# Patient Record
Sex: Female | Born: 1955 | Race: White | Hispanic: No | Marital: Married | Smoking: Never smoker
Health system: Southern US, Community
[De-identification: ages and names within clinical notes are randomized; demographics above are authoritative.]

## PROBLEM LIST (undated history)

## (undated) DIAGNOSIS — E785 Hyperlipidemia, unspecified: Secondary | ICD-10-CM

## (undated) DIAGNOSIS — I10 Essential (primary) hypertension: Secondary | ICD-10-CM

## (undated) DIAGNOSIS — G709 Myoneural disorder, unspecified: Secondary | ICD-10-CM

## (undated) DIAGNOSIS — R600 Localized edema: Secondary | ICD-10-CM

## (undated) DIAGNOSIS — M069 Rheumatoid arthritis, unspecified: Secondary | ICD-10-CM

## (undated) DIAGNOSIS — I059 Rheumatic mitral valve disease, unspecified: Secondary | ICD-10-CM

## (undated) DIAGNOSIS — IMO0001 Reserved for inherently not codable concepts without codable children: Secondary | ICD-10-CM

## (undated) DIAGNOSIS — R569 Unspecified convulsions: Secondary | ICD-10-CM

## (undated) DIAGNOSIS — I499 Cardiac arrhythmia, unspecified: Secondary | ICD-10-CM

## (undated) HISTORY — PX: CARDIAC ELECTROPHYSIOLOGY MAPPING AND ABLATION: SHX1292

## (undated) HISTORY — PX: CERVICAL POLYPECTOMY: SHX88

---

## 1999-10-31 HISTORY — PX: CARDIAC VALVE SURGERY: SHX40

## 2001-03-27 ENCOUNTER — Other Ambulatory Visit: Admission: RE | Admit: 2001-03-27 | Discharge: 2001-03-27 | Payer: Self-pay | Admitting: Family Medicine

## 2005-08-11 ENCOUNTER — Ambulatory Visit: Payer: Self-pay | Admitting: Unknown Physician Specialty

## 2008-02-20 LAB — HM MAMMOGRAPHY: HM Mammogram: NEGATIVE

## 2008-07-02 LAB — HM PAP SMEAR: HM Pap smear: NORMAL

## 2010-10-30 HISTORY — PX: ABDOMINAL HYSTERECTOMY: SHX81

## 2010-10-30 HISTORY — PX: KNEE ARTHROSCOPY: SUR90

## 2011-03-14 ENCOUNTER — Ambulatory Visit: Payer: Self-pay

## 2011-03-23 ENCOUNTER — Ambulatory Visit: Payer: Self-pay | Admitting: Unknown Physician Specialty

## 2011-03-30 ENCOUNTER — Ambulatory Visit: Payer: Self-pay | Admitting: Unknown Physician Specialty

## 2011-04-30 ENCOUNTER — Ambulatory Visit: Payer: Self-pay | Admitting: Gynecologic Oncology

## 2011-05-15 ENCOUNTER — Ambulatory Visit: Payer: Self-pay | Admitting: Unknown Physician Specialty

## 2011-05-15 LAB — HM COLONOSCOPY

## 2011-05-30 ENCOUNTER — Ambulatory Visit: Payer: Self-pay | Admitting: Gynecologic Oncology

## 2011-05-31 ENCOUNTER — Ambulatory Visit: Payer: Self-pay | Admitting: Gynecologic Oncology

## 2011-07-05 ENCOUNTER — Ambulatory Visit: Payer: Self-pay | Admitting: Obstetrics & Gynecology

## 2011-07-11 ENCOUNTER — Ambulatory Visit: Payer: Self-pay | Admitting: Obstetrics & Gynecology

## 2011-07-14 LAB — PATHOLOGY REPORT

## 2013-07-03 DIAGNOSIS — I059 Rheumatic mitral valve disease, unspecified: Secondary | ICD-10-CM | POA: Insufficient documentation

## 2013-07-04 ENCOUNTER — Ambulatory Visit: Payer: Self-pay | Admitting: General Practice

## 2014-05-26 ENCOUNTER — Other Ambulatory Visit: Payer: Self-pay | Admitting: Specialist

## 2014-05-26 DIAGNOSIS — M545 Low back pain, unspecified: Secondary | ICD-10-CM

## 2014-05-27 ENCOUNTER — Ambulatory Visit
Admission: RE | Admit: 2014-05-27 | Discharge: 2014-05-27 | Disposition: A | Discharge status: Home / Self Care | Payer: No Typology Code available for payment source | Source: Ambulatory Visit | Attending: Specialist | Admitting: Specialist

## 2014-05-27 DIAGNOSIS — M545 Low back pain, unspecified: Secondary | ICD-10-CM

## 2014-07-28 DIAGNOSIS — R0602 Shortness of breath: Secondary | ICD-10-CM | POA: Insufficient documentation

## 2014-07-28 DIAGNOSIS — I4891 Unspecified atrial fibrillation: Secondary | ICD-10-CM | POA: Insufficient documentation

## 2014-07-28 DIAGNOSIS — I480 Paroxysmal atrial fibrillation: Secondary | ICD-10-CM | POA: Insufficient documentation

## 2014-08-18 ENCOUNTER — Other Ambulatory Visit (HOSPITAL_COMMUNITY): Payer: No Typology Code available for payment source

## 2014-08-25 ENCOUNTER — Encounter (HOSPITAL_COMMUNITY): Admission: RE | Payer: Self-pay | Source: Ambulatory Visit

## 2014-08-25 ENCOUNTER — Inpatient Hospital Stay (HOSPITAL_COMMUNITY)
Admission: RE | Admit: 2014-08-25 | Payer: No Typology Code available for payment source | Source: Ambulatory Visit | Admitting: Orthopaedic Surgery

## 2014-08-25 SURGERY — ARTHROPLASTY, HIP, TOTAL, ANTERIOR APPROACH
Anesthesia: Choice | Laterality: Left

## 2014-09-25 ENCOUNTER — Ambulatory Visit: Payer: Self-pay | Admitting: Family Medicine

## 2014-09-25 LAB — PROTIME-INR
INR: 2
PROTHROMBIN TIME: 21.8 s — AB (ref 11.5–14.7)

## 2014-09-28 LAB — HEMOGLOBIN A1C: Hgb A1c MFr Bld: 5.7 % (ref 4.0–6.0)

## 2014-09-28 LAB — TSH: TSH: 3.38 u[IU]/mL (ref <=5.90)

## 2014-10-14 LAB — HEPATIC FUNCTION PANEL
ALT: 36 U/L — AB (ref 7–35)
AST: 23 U/L (ref 13–35)

## 2014-10-14 LAB — BASIC METABOLIC PANEL
BUN: 20 mg/dL (ref 4–21)
CREATININE: 0.8 mg/dL (ref <=1.1)
Glucose: 87 mg/dL
SODIUM: 141 mmol/L (ref 137–147)

## 2014-10-14 LAB — CBC AND DIFFERENTIAL: WBC: 5.8 10^3/mL

## 2014-10-27 ENCOUNTER — Other Ambulatory Visit (HOSPITAL_COMMUNITY): Payer: Self-pay | Admitting: Orthopaedic Surgery

## 2014-10-28 ENCOUNTER — Other Ambulatory Visit (HOSPITAL_COMMUNITY): Payer: Self-pay | Admitting: *Deleted

## 2014-10-28 NOTE — Patient Instructions (Addendum)
Lavonia Eager  10/28/2014   Your procedure is scheduled on: 11/06/13   Report to [facility]  Entrance and follow signs to               Short Stay Center at 7:40 AM.   Call this number if you have problems the morning of surgery [phone]   Remember:  Do not eat food or drink liquids :After Midnight.     Take these medicines the morning of surgery with A SIP OF WATER: METOPROLOL                               You may not have any metal on your body including hair pins and              piercings  Do not wear jewelry, make-up, lotions, powders or perfumes.             Do not wear nail polish.  Do not shave  48 hours prior to surgery.              Men may shave face and neck.   Do not bring valuables to the hospital. [facility] IS NOT             RESPONSIBLE   FOR VALUABLES.  Contacts, dentures or bridgework may not be worn into surgery.  Leave suitcase in the car. After surgery it may be brought to your room.     Patients discharged the day of surgery will not be allowed to drive home.  Name and phone number of your driver:  Special Instructions: N/A              Please read over the following fact sheets you were given: _____________________________________________________________________                                                     [facility] - PREPARING FOR SURGERY  Before surgery, you can play an important role.  Because skin is not sterile, your skin needs to be as free of germs as possible.  You can reduce the number of germs on your skin by washing with CHG (chlorahexidine gluconate) soap before surgery.  CHG is an antiseptic cleaner which kills germs and bonds with the skin to continue killing germs even after washing. Please DO NOT use if you have an allergy to CHG or antibacterial soaps.  If your skin becomes reddened/irritated stop using the CHG and inform your nurse when you arrive at Short Stay. Do not shave (including legs  and underarms) for at least 48 hours prior to the first CHG shower.  You may shave your face. Please follow these instructions carefully:   1.  Shower with CHG Soap the night before surgery and the  morning of Surgery.   2.  If you choose to wash your hair, wash your hair first as usual with your  normal  Shampoo.   3.  After you shampoo, rinse your hair and body thoroughly to remove the  shampoo.  4.  Use CHG as you would any other liquid soap.  You can apply chg directly  to the skin and wash . Gently wash with scrungie or clean wascloth    5.  Apply the CHG Soap to your body ONLY FROM THE NECK DOWN.   Do not use on open                           Wound or open sores. Avoid contact with eyes, ears mouth and genitals (private parts).                        Genitals (private parts) with your normal soap.              6.  Wash thoroughly, paying special attention to the area where your surgery  will be performed.   7.  Thoroughly rinse your body with warm water from the neck down.   8.  DO NOT shower/wash with your normal soap after using and rinsing off  the CHG Soap .                9.  Pat yourself dry with a clean towel.             10.  Wear clean pajamas.             11.  Place clean sheets on your bed the night of your first shower and do not  sleep with pets.  Day of Surgery : Do not apply any lotions/deodorants the morning of surgery.  Please wear clean clothes to the hospital/surgery center.  FAILURE TO FOLLOW THESE INSTRUCTIONS MAY RESULT IN THE CANCELLATION OF YOUR SURGERY    PATIENT SIGNATURE_________________________________  ______________________________________________________________________

## 2014-10-29 ENCOUNTER — Encounter (HOSPITAL_COMMUNITY): Payer: Self-pay

## 2014-10-29 ENCOUNTER — Ambulatory Visit (HOSPITAL_COMMUNITY)
Admission: RE | Admit: 2014-10-29 | Discharge: 2014-10-29 | Disposition: A | Discharge status: Home / Self Care | Payer: No Typology Code available for payment source | Source: Ambulatory Visit | Attending: Anesthesiology | Admitting: Anesthesiology

## 2014-10-29 ENCOUNTER — Encounter (HOSPITAL_COMMUNITY)
Admission: RE | Admit: 2014-10-29 | Discharge: 2014-10-29 | Disposition: A | Discharge status: Home / Self Care | Payer: No Typology Code available for payment source | Source: Ambulatory Visit | Attending: Orthopaedic Surgery | Admitting: Orthopaedic Surgery

## 2014-10-29 DIAGNOSIS — I10 Essential (primary) hypertension: Secondary | ICD-10-CM | POA: Insufficient documentation

## 2014-10-29 DIAGNOSIS — I4891 Unspecified atrial fibrillation: Secondary | ICD-10-CM | POA: Insufficient documentation

## 2014-10-29 DIAGNOSIS — Z01818 Encounter for other preprocedural examination: Secondary | ICD-10-CM | POA: Insufficient documentation

## 2014-10-29 DIAGNOSIS — M5134 Other intervertebral disc degeneration, thoracic region: Secondary | ICD-10-CM | POA: Diagnosis not present

## 2014-10-29 HISTORY — DX: Reserved for inherently not codable concepts without codable children: IMO0001

## 2014-10-29 HISTORY — DX: Essential (primary) hypertension: I10

## 2014-10-29 HISTORY — DX: Cardiac arrhythmia, unspecified: I49.9

## 2014-10-29 HISTORY — DX: Unspecified convulsions: R56.9

## 2014-10-29 HISTORY — DX: Rheumatic mitral valve disease, unspecified: I05.9

## 2014-10-29 HISTORY — DX: Hyperlipidemia, unspecified: E78.5

## 2014-10-29 HISTORY — DX: Localized edema: R60.0

## 2014-10-29 HISTORY — DX: Rheumatoid arthritis, unspecified: M06.9

## 2014-10-29 HISTORY — DX: Myoneural disorder, unspecified: G70.9

## 2014-10-29 LAB — URINALYSIS, ROUTINE W REFLEX MICROSCOPIC
Bilirubin Urine: NEGATIVE
Glucose, UA: NEGATIVE mg/dL
Hgb urine dipstick: NEGATIVE
Ketones, ur: NEGATIVE mg/dL
Leukocytes, UA: NEGATIVE
Nitrite: NEGATIVE
PH: 5.5 (ref 5.0–8.0)
PROTEIN: NEGATIVE mg/dL
SPECIFIC GRAVITY, URINE: 1.015 (ref 1.005–1.030)
UROBILINOGEN UA: 0.2 mg/dL (ref 0.0–1.0)

## 2014-10-29 LAB — CBC
HEMATOCRIT: 42.3 % (ref 36.0–46.0)
HEMOGLOBIN: 13.5 g/dL (ref 12.0–15.0)
MCH: 29.9 pg (ref 26.0–34.0)
MCHC: 31.9 g/dL (ref 30.0–36.0)
MCV: 93.6 fL (ref 78.0–100.0)
Platelets: 175 10*3/uL (ref 150–400)
RBC: 4.52 MIL/uL (ref 3.87–5.11)
RDW: 14.4 % (ref 11.5–15.5)
WBC: 6 10*3/uL (ref 4.0–10.5)

## 2014-10-29 LAB — BASIC METABOLIC PANEL
Anion gap: 5 (ref 5–15)
BUN: 19 mg/dL (ref 6–23)
CALCIUM: 9.1 mg/dL (ref 8.4–10.5)
CO2: 32 mmol/L (ref 19–32)
CREATININE: 0.83 mg/dL (ref 0.50–1.10)
Chloride: 103 mEq/L (ref 96–112)
GFR calc Af Amer: 88 mL/min — ABNORMAL LOW (ref >90)
GFR, EST NON AFRICAN AMERICAN: 76 mL/min — AB (ref >90)
Glucose, Bld: 81 mg/dL (ref 70–99)
POTASSIUM: 4.4 mmol/L (ref 3.5–5.1)
Sodium: 140 mmol/L (ref 135–145)

## 2014-10-29 LAB — SURGICAL PCR SCREEN
MRSA, PCR: NEGATIVE
Staphylococcus aureus: NEGATIVE

## 2014-10-29 LAB — PROTIME-INR
INR: 2 — AB (ref 0.00–1.49)
Prothrombin Time: 22.9 seconds — ABNORMAL HIGH (ref 11.6–15.2)

## 2014-10-29 LAB — APTT: aPTT: 39 seconds — ABNORMAL HIGH (ref 24–37)

## 2014-10-29 LAB — ABO/RH: ABO/RH(D): AB POS

## 2014-10-29 NOTE — Progress Notes (Signed)
Dr Leta Jungling made aware patient is a cardiac patient.  Clearance on chart from Dr Regino Schultze( cardiologist )   Patient with history of valve surgery in 2001 , hypertension, and atrial fib .  No new orders given.

## 2014-10-29 NOTE — Progress Notes (Signed)
PT and PTT result from 10/29/2014 faxed via EPIC to Dr Allie Bossier via EPIC.  PT/INR will be drawn am of surgery.

## 2014-10-29 NOTE — Progress Notes (Signed)
   10/29/14 0825  OBSTRUCTIVE SLEEP APNEA  Have you ever been diagnosed with sleep apnea through a sleep study? No  Do you snore loudly (loud enough to be heard through closed doors)?  1  Do you often feel tired, fatigued, or sleepy during the daytime? 1  Has anyone observed you stop breathing during your sleep? 0  Do you have, or are you being treated for high blood pressure? 1  BMI more than 35 kg/m2? 1  Age over 59 years old? 1  Neck circumference greater than 40 cm/16 inches? 1  Gender: 0  Obstructive Sleep Apnea Score 6  Score 4 or greater  Results sent to PCP

## 2014-11-06 ENCOUNTER — Encounter (HOSPITAL_COMMUNITY): Payer: Self-pay | Admitting: *Deleted

## 2014-11-06 ENCOUNTER — Inpatient Hospital Stay (HOSPITAL_COMMUNITY): Payer: No Typology Code available for payment source | Admitting: Registered Nurse

## 2014-11-06 ENCOUNTER — Inpatient Hospital Stay (HOSPITAL_COMMUNITY): Payer: No Typology Code available for payment source

## 2014-11-06 ENCOUNTER — Inpatient Hospital Stay (HOSPITAL_COMMUNITY)
Admission: RE | Admit: 2014-11-06 | Discharge: 2014-11-09 | DRG: 470 | Disposition: A | Discharge status: Home / Self Care | Payer: No Typology Code available for payment source | Source: Ambulatory Visit | Attending: Orthopaedic Surgery | Admitting: Orthopaedic Surgery

## 2014-11-06 ENCOUNTER — Encounter (HOSPITAL_COMMUNITY)
Admission: RE | Disposition: A | Discharge status: Home / Self Care | Payer: Self-pay | Source: Ambulatory Visit | Attending: Orthopaedic Surgery

## 2014-11-06 DIAGNOSIS — M1612 Unilateral primary osteoarthritis, left hip: Principal | ICD-10-CM | POA: Diagnosis present

## 2014-11-06 DIAGNOSIS — Z90710 Acquired absence of both cervix and uterus: Secondary | ICD-10-CM | POA: Diagnosis not present

## 2014-11-06 DIAGNOSIS — I050 Rheumatic mitral stenosis: Secondary | ICD-10-CM | POA: Diagnosis present

## 2014-11-06 DIAGNOSIS — I10 Essential (primary) hypertension: Secondary | ICD-10-CM | POA: Diagnosis present

## 2014-11-06 DIAGNOSIS — M25552 Pain in left hip: Secondary | ICD-10-CM | POA: Diagnosis present

## 2014-11-06 DIAGNOSIS — F1010 Alcohol abuse, uncomplicated: Secondary | ICD-10-CM | POA: Diagnosis present

## 2014-11-06 DIAGNOSIS — R569 Unspecified convulsions: Secondary | ICD-10-CM | POA: Diagnosis present

## 2014-11-06 DIAGNOSIS — E785 Hyperlipidemia, unspecified: Secondary | ICD-10-CM | POA: Diagnosis present

## 2014-11-06 DIAGNOSIS — M069 Rheumatoid arthritis, unspecified: Secondary | ICD-10-CM | POA: Diagnosis present

## 2014-11-06 DIAGNOSIS — Z7982 Long term (current) use of aspirin: Secondary | ICD-10-CM

## 2014-11-06 DIAGNOSIS — Z419 Encounter for procedure for purposes other than remedying health state, unspecified: Secondary | ICD-10-CM

## 2014-11-06 DIAGNOSIS — D62 Acute posthemorrhagic anemia: Secondary | ICD-10-CM | POA: Diagnosis not present

## 2014-11-06 DIAGNOSIS — I4891 Unspecified atrial fibrillation: Secondary | ICD-10-CM | POA: Diagnosis present

## 2014-11-06 DIAGNOSIS — M1611 Unilateral primary osteoarthritis, right hip: Secondary | ICD-10-CM

## 2014-11-06 DIAGNOSIS — Z96642 Presence of left artificial hip joint: Secondary | ICD-10-CM

## 2014-11-06 HISTORY — PX: TOTAL HIP ARTHROPLASTY: SHX124

## 2014-11-06 LAB — TYPE AND SCREEN
ABO/RH(D): AB POS
Antibody Screen: NEGATIVE

## 2014-11-06 LAB — PROTIME-INR
INR: 1.05 (ref 0.00–1.49)
Prothrombin Time: 13.8 seconds (ref 11.6–15.2)

## 2014-11-06 SURGERY — ARTHROPLASTY, HIP, TOTAL, ANTERIOR APPROACH
Anesthesia: General | Site: Hip | Laterality: Left

## 2014-11-06 MED ORDER — CEFAZOLIN SODIUM-DEXTROSE 2-3 GM-% IV SOLR
INTRAVENOUS | Status: AC
  Filled 2014-11-06: qty 50

## 2014-11-06 MED ORDER — ONDANSETRON HCL 4 MG/2ML IJ SOLN
INTRAMUSCULAR | Status: DC | PRN
  Administered 2014-11-06: 4 mg via INTRAVENOUS

## 2014-11-06 MED ORDER — FENTANYL CITRATE 0.05 MG/ML IJ SOLN
INTRAMUSCULAR | Status: AC
  Filled 2014-11-06: qty 2

## 2014-11-06 MED ORDER — HYDRALAZINE HCL 20 MG/ML IJ SOLN
2.0000 mg | Freq: Once | INTRAMUSCULAR | Status: AC
  Administered 2014-11-06: 2 mg via INTRAVENOUS

## 2014-11-06 MED ORDER — GLYCOPYRROLATE 0.2 MG/ML IJ SOLN
INTRAMUSCULAR | Status: DC | PRN
  Administered 2014-11-06: 0.2 mg via INTRAVENOUS
  Administered 2014-11-06: .8 mg via INTRAVENOUS

## 2014-11-06 MED ORDER — ASPIRIN EC 81 MG PO TBEC
81.0000 mg | DELAYED_RELEASE_TABLET | Freq: Every day | ORAL | Status: DC
  Administered 2014-11-07 – 2014-11-09 (×3): 81 mg via ORAL
  Filled 2014-11-06 (×3): qty 1

## 2014-11-06 MED ORDER — METOPROLOL TARTRATE 50 MG PO TABS
50.0000 mg | ORAL_TABLET | Freq: Every morning | ORAL | Status: DC
  Administered 2014-11-07 – 2014-11-09 (×3): 50 mg via ORAL
  Filled 2014-11-06 (×3): qty 1

## 2014-11-06 MED ORDER — LIDOCAINE HCL (CARDIAC) 20 MG/ML IV SOLN
INTRAVENOUS | Status: DC | PRN
  Administered 2014-11-06: 100 mg via INTRAVENOUS

## 2014-11-06 MED ORDER — HYDROMORPHONE HCL 1 MG/ML IJ SOLN: 1.0000 mg | INTRAMUSCULAR | Status: DC | PRN

## 2014-11-06 MED ORDER — DOCUSATE SODIUM 100 MG PO CAPS
100.0000 mg | ORAL_CAPSULE | Freq: Two times a day (BID) | ORAL | Status: DC
  Administered 2014-11-06 – 2014-11-09 (×6): 100 mg via ORAL
  Filled 2014-11-06 (×3): qty 1

## 2014-11-06 MED ORDER — HYDROMORPHONE HCL 2 MG/ML IJ SOLN
INTRAMUSCULAR | Status: AC
  Filled 2014-11-06: qty 1

## 2014-11-06 MED ORDER — 0.9 % SODIUM CHLORIDE (POUR BTL) OPTIME
TOPICAL | Status: DC | PRN
  Administered 2014-11-06: 1000 mL

## 2014-11-06 MED ORDER — WARFARIN SODIUM 7.5 MG PO TABS
7.5000 mg | ORAL_TABLET | Freq: Once | ORAL | Status: AC
  Administered 2014-11-06: 7.5 mg via ORAL
  Filled 2014-11-06: qty 1

## 2014-11-06 MED ORDER — CEFAZOLIN SODIUM-DEXTROSE 2-3 GM-% IV SOLR
2.0000 g | Freq: Four times a day (QID) | INTRAVENOUS | Status: AC
  Administered 2014-11-06 (×2): 2 g via INTRAVENOUS
  Filled 2014-11-06 (×3): qty 50

## 2014-11-06 MED ORDER — PSYLLIUM 95 % PO PACK
1.0000 | PACK | Freq: Two times a day (BID) | ORAL | Status: DC
  Administered 2014-11-06 – 2014-11-09 (×6): 1 via ORAL
  Filled 2014-11-06 (×7): qty 1

## 2014-11-06 MED ORDER — ONDANSETRON HCL 4 MG PO TABS
4.0000 mg | ORAL_TABLET | Freq: Four times a day (QID) | ORAL | Status: DC | PRN
  Filled 2014-11-06: qty 1

## 2014-11-06 MED ORDER — MENTHOL 3 MG MT LOZG
1.0000 | LOZENGE | OROMUCOSAL | Status: DC | PRN
  Filled 2014-11-06: qty 9

## 2014-11-06 MED ORDER — MEPERIDINE HCL 50 MG/ML IJ SOLN: 6.2500 mg | INTRAMUSCULAR | Status: DC | PRN

## 2014-11-06 MED ORDER — ONDANSETRON HCL 4 MG/2ML IJ SOLN: 4.0000 mg | Freq: Four times a day (QID) | INTRAMUSCULAR | Status: DC | PRN

## 2014-11-06 MED ORDER — POTASSIUM CHLORIDE CRYS ER 10 MEQ PO TBCR
10.0000 meq | EXTENDED_RELEASE_TABLET | Freq: Every day | ORAL | Status: DC
  Administered 2014-11-06: 10 meq via ORAL
  Filled 2014-11-06 (×2): qty 1

## 2014-11-06 MED ORDER — NEOSTIGMINE METHYLSULFATE 10 MG/10ML IV SOLN
INTRAVENOUS | Status: DC | PRN
  Administered 2014-11-06: 5 mg via INTRAVENOUS

## 2014-11-06 MED ORDER — LACTATED RINGERS IV SOLN
INTRAVENOUS | Status: DC
  Administered 2014-11-06 (×2): 1000 mL via INTRAVENOUS

## 2014-11-06 MED ORDER — POLYETHYLENE GLYCOL 3350 17 G PO PACK
17.0000 g | PACK | Freq: Every day | ORAL | Status: DC | PRN
  Filled 2014-11-06: qty 1

## 2014-11-06 MED ORDER — MIDAZOLAM HCL 2 MG/2ML IJ SOLN
INTRAMUSCULAR | Status: AC
  Filled 2014-11-06: qty 2

## 2014-11-06 MED ORDER — METOCLOPRAMIDE HCL 5 MG PO TABS
5.0000 mg | ORAL_TABLET | Freq: Three times a day (TID) | ORAL | Status: DC | PRN
  Filled 2014-11-06: qty 2

## 2014-11-06 MED ORDER — CEFAZOLIN SODIUM-DEXTROSE 2-3 GM-% IV SOLR
2.0000 g | INTRAVENOUS | Status: AC
  Administered 2014-11-06: 2 g via INTRAVENOUS

## 2014-11-06 MED ORDER — WARFARIN - PHARMACIST DOSING INPATIENT: Freq: Every day | Status: DC

## 2014-11-06 MED ORDER — ACETAMINOPHEN 325 MG PO TABS
650.0000 mg | ORAL_TABLET | Freq: Four times a day (QID) | ORAL | Status: DC | PRN
  Administered 2014-11-06 – 2014-11-08 (×5): 650 mg via ORAL
  Filled 2014-11-06 (×5): qty 2

## 2014-11-06 MED ORDER — PROMETHAZINE HCL 25 MG/ML IJ SOLN: 6.2500 mg | INTRAMUSCULAR | Status: DC | PRN

## 2014-11-06 MED ORDER — PHENOL 1.4 % MT LIQD
1.0000 | OROMUCOSAL | Status: DC | PRN
  Filled 2014-11-06: qty 177

## 2014-11-06 MED ORDER — ROCURONIUM BROMIDE 100 MG/10ML IV SOLN
INTRAVENOUS | Status: DC | PRN
  Administered 2014-11-06: 30 mg via INTRAVENOUS

## 2014-11-06 MED ORDER — ZOLPIDEM TARTRATE 5 MG PO TABS: 5.0000 mg | ORAL_TABLET | Freq: Every evening | ORAL | Status: DC | PRN

## 2014-11-06 MED ORDER — SODIUM CHLORIDE 0.9 % IR SOLN
Status: DC | PRN
  Administered 2014-11-06: 1000 mL

## 2014-11-06 MED ORDER — PHENYTOIN SODIUM EXTENDED 100 MG PO CAPS
400.0000 mg | ORAL_CAPSULE | Freq: Every day | ORAL | Status: DC
  Administered 2014-11-06 – 2014-11-08 (×3): 400 mg via ORAL
  Filled 2014-11-06 (×5): qty 4

## 2014-11-06 MED ORDER — LOSARTAN POTASSIUM 50 MG PO TABS
100.0000 mg | ORAL_TABLET | Freq: Every morning | ORAL | Status: DC
  Administered 2014-11-08: 100 mg via ORAL
  Filled 2014-11-06 (×4): qty 2

## 2014-11-06 MED ORDER — DEXAMETHASONE SODIUM PHOSPHATE 10 MG/ML IJ SOLN
INTRAMUSCULAR | Status: DC | PRN
  Administered 2014-11-06: 10 mg via INTRAVENOUS

## 2014-11-06 MED ORDER — METHOCARBAMOL 1000 MG/10ML IJ SOLN
500.0000 mg | Freq: Four times a day (QID) | INTRAVENOUS | Status: DC | PRN
  Administered 2014-11-06: 500 mg via INTRAVENOUS
  Filled 2014-11-06 (×2): qty 5

## 2014-11-06 MED ORDER — ONDANSETRON HCL 4 MG/2ML IJ SOLN
INTRAMUSCULAR | Status: AC
  Filled 2014-11-06: qty 2

## 2014-11-06 MED ORDER — GLYCOPYRROLATE 0.2 MG/ML IJ SOLN
INTRAMUSCULAR | Status: AC
  Filled 2014-11-06: qty 4

## 2014-11-06 MED ORDER — ADULT MULTIVITAMIN W/MINERALS CH
1.0000 | ORAL_TABLET | Freq: Every day | ORAL | Status: DC
  Administered 2014-11-06 – 2014-11-08 (×3): 1 via ORAL
  Filled 2014-11-06 (×4): qty 1

## 2014-11-06 MED ORDER — SUCCINYLCHOLINE CHLORIDE 20 MG/ML IJ SOLN
INTRAMUSCULAR | Status: DC | PRN
  Administered 2014-11-06: 100 mg via INTRAVENOUS

## 2014-11-06 MED ORDER — PROPOFOL 10 MG/ML IV BOLUS
INTRAVENOUS | Status: DC | PRN
  Administered 2014-11-06: 20 mg via INTRAVENOUS
  Administered 2014-11-06: 200 mg via INTRAVENOUS

## 2014-11-06 MED ORDER — ROCURONIUM BROMIDE 100 MG/10ML IV SOLN
INTRAVENOUS | Status: AC
Start: 2014-11-06 — End: 2014-11-06
  Filled 2014-11-06: qty 1

## 2014-11-06 MED ORDER — HYDROMORPHONE HCL 1 MG/ML IJ SOLN
INTRAMUSCULAR | Status: DC | PRN
  Administered 2014-11-06 (×4): 0.5 mg via INTRAVENOUS

## 2014-11-06 MED ORDER — MIDAZOLAM HCL 5 MG/5ML IJ SOLN
INTRAMUSCULAR | Status: DC | PRN
  Administered 2014-11-06 (×2): 1 mg via INTRAVENOUS

## 2014-11-06 MED ORDER — HYDROMORPHONE HCL 1 MG/ML IJ SOLN
0.2500 mg | INTRAMUSCULAR | Status: DC | PRN
  Administered 2014-11-06 (×2): 0.5 mg via INTRAVENOUS

## 2014-11-06 MED ORDER — LIDOCAINE HCL (CARDIAC) 20 MG/ML IV SOLN
INTRAVENOUS | Status: AC
  Filled 2014-11-06: qty 5

## 2014-11-06 MED ORDER — LACTATED RINGERS IV SOLN: INTRAVENOUS | Status: DC

## 2014-11-06 MED ORDER — TORSEMIDE 20 MG PO TABS
20.0000 mg | ORAL_TABLET | Freq: Every day | ORAL | Status: DC
  Administered 2014-11-06 – 2014-11-08 (×3): 20 mg via ORAL
  Filled 2014-11-06 (×6): qty 1

## 2014-11-06 MED ORDER — NEOSTIGMINE METHYLSULFATE 10 MG/10ML IV SOLN
INTRAVENOUS | Status: AC
  Filled 2014-11-06: qty 1

## 2014-11-06 MED ORDER — HYDROMORPHONE HCL 1 MG/ML IJ SOLN
INTRAMUSCULAR | Status: AC
  Filled 2014-11-06: qty 1

## 2014-11-06 MED ORDER — OXYCODONE HCL 5 MG PO TABS
5.0000 mg | ORAL_TABLET | ORAL | Status: DC | PRN
  Administered 2014-11-06 – 2014-11-08 (×10): 10 mg via ORAL
  Administered 2014-11-09: 5 mg via ORAL
  Filled 2014-11-06 (×3): qty 2
  Filled 2014-11-06: qty 1
  Filled 2014-11-06 (×8): qty 2

## 2014-11-06 MED ORDER — SODIUM CHLORIDE 0.9 % IV SOLN
INTRAVENOUS | Status: DC
  Administered 2014-11-06: 75 mL/h via INTRAVENOUS

## 2014-11-06 MED ORDER — DIPHENHYDRAMINE HCL 12.5 MG/5ML PO ELIX
12.5000 mg | ORAL_SOLUTION | ORAL | Status: DC | PRN
  Filled 2014-11-06: qty 10

## 2014-11-06 MED ORDER — ACETAMINOPHEN 650 MG RE SUPP
650.0000 mg | Freq: Four times a day (QID) | RECTAL | Status: DC | PRN
  Filled 2014-11-06: qty 1

## 2014-11-06 MED ORDER — HYDRALAZINE HCL 20 MG/ML IJ SOLN
INTRAMUSCULAR | Status: AC
  Filled 2014-11-06: qty 1

## 2014-11-06 MED ORDER — METHOCARBAMOL 500 MG PO TABS
500.0000 mg | ORAL_TABLET | Freq: Four times a day (QID) | ORAL | Status: DC | PRN
  Administered 2014-11-07 – 2014-11-09 (×6): 500 mg via ORAL
  Filled 2014-11-06 (×6): qty 1

## 2014-11-06 MED ORDER — FENTANYL CITRATE 0.05 MG/ML IJ SOLN
INTRAMUSCULAR | Status: DC | PRN
  Administered 2014-11-06 (×4): 50 ug via INTRAVENOUS

## 2014-11-06 MED ORDER — PROPOFOL 10 MG/ML IV BOLUS
INTRAVENOUS | Status: AC
  Filled 2014-11-06: qty 20

## 2014-11-06 MED ORDER — ALUM & MAG HYDROXIDE-SIMETH 200-200-20 MG/5ML PO SUSP
30.0000 mL | ORAL | Status: DC | PRN
  Filled 2014-11-06: qty 30

## 2014-11-06 MED ORDER — METOCLOPRAMIDE HCL 5 MG/ML IJ SOLN
5.0000 mg | Freq: Three times a day (TID) | INTRAMUSCULAR | Status: DC | PRN
  Administered 2014-11-06: 10 mg via INTRAVENOUS
  Filled 2014-11-06: qty 2

## 2014-11-06 SURGICAL SUPPLY — 48 items
APL SKNCLS STERI-STRIP NONHPOA (GAUZE/BANDAGES/DRESSINGS)
BAG SPEC THK2 15X12 ZIP CLS (MISCELLANEOUS)
BAG ZIPLOCK 12X15 (MISCELLANEOUS) IMPLANT
BENZOIN TINCTURE PRP APPL 2/3 (GAUZE/BANDAGES/DRESSINGS) IMPLANT
BLADE SAW SGTL 18X1.27X75 (BLADE) ×2 IMPLANT
CAPT HIP TOTAL 2 ×1 IMPLANT
CELLS DAT CNTRL 66122 CELL SVR (MISCELLANEOUS) ×1 IMPLANT
COVER PERINEAL POST (MISCELLANEOUS) ×2 IMPLANT
DRAPE C-ARM 42X120 X-RAY (DRAPES) ×2 IMPLANT
DRAPE STERI IOBAN 125X83 (DRAPES) ×2 IMPLANT
DRAPE U-SHAPE 47X51 STRL (DRAPES) ×6 IMPLANT
DRSG AQUACEL AG ADV 3.5X10 (GAUZE/BANDAGES/DRESSINGS) ×2 IMPLANT
DURAPREP 26ML APPLICATOR (WOUND CARE) ×2 IMPLANT
ELECT BLADE TIP CTD 4 INCH (ELECTRODE) ×2 IMPLANT
ELECT REM PT RETURN 9FT ADLT (ELECTROSURGICAL) ×2
ELECTRODE REM PT RTRN 9FT ADLT (ELECTROSURGICAL) ×1 IMPLANT
FACESHIELD WRAPAROUND (MASK) ×8 IMPLANT
FACESHIELD WRAPAROUND OR TEAM (MASK) ×4 IMPLANT
GAUZE XEROFORM 1X8 LF (GAUZE/BANDAGES/DRESSINGS) ×1 IMPLANT
GLOVE BIO SURGEON STRL SZ7.5 (GLOVE) ×3 IMPLANT
GLOVE BIO SURGEON STRL SZ8 (GLOVE) ×1 IMPLANT
GLOVE BIOGEL PI IND STRL 6.5 (GLOVE) IMPLANT
GLOVE BIOGEL PI IND STRL 7.5 (GLOVE) IMPLANT
GLOVE BIOGEL PI IND STRL 8 (GLOVE) ×2 IMPLANT
GLOVE BIOGEL PI IND STRL 8.5 (GLOVE) IMPLANT
GLOVE BIOGEL PI INDICATOR 6.5 (GLOVE) ×1
GLOVE BIOGEL PI INDICATOR 7.5 (GLOVE) ×1
GLOVE BIOGEL PI INDICATOR 8 (GLOVE) ×2
GLOVE BIOGEL PI INDICATOR 8.5 (GLOVE) ×1
GLOVE ECLIPSE 8.0 STRL XLNG CF (GLOVE) ×2 IMPLANT
GOWN STRL REUS W/TWL XL LVL3 (GOWN DISPOSABLE) ×6 IMPLANT
HANDPIECE INTERPULSE COAX TIP (DISPOSABLE) ×2
KIT BASIN OR (CUSTOM PROCEDURE TRAY) ×2 IMPLANT
PACK TOTAL JOINT (CUSTOM PROCEDURE TRAY) ×2 IMPLANT
RETRACTOR WND ALEXIS 18 MED (MISCELLANEOUS) ×1 IMPLANT
RTRCTR WOUND ALEXIS 18CM MED (MISCELLANEOUS) ×2
SET HNDPC FAN SPRY TIP SCT (DISPOSABLE) ×1 IMPLANT
STAPLER VISISTAT 35W (STAPLE) ×1 IMPLANT
STRIP CLOSURE SKIN 1/2X4 (GAUZE/BANDAGES/DRESSINGS) IMPLANT
SUT ETHIBOND NAB CT1 #1 30IN (SUTURE) ×2 IMPLANT
SUT MNCRL AB 4-0 PS2 18 (SUTURE) IMPLANT
SUT VIC AB 0 CT1 36 (SUTURE) ×2 IMPLANT
SUT VIC AB 1 CT1 36 (SUTURE) ×2 IMPLANT
SUT VIC AB 2-0 CT1 27 (SUTURE) ×6
SUT VIC AB 2-0 CT1 TAPERPNT 27 (SUTURE) ×2 IMPLANT
TOWEL OR 17X26 10 PK STRL BLUE (TOWEL DISPOSABLE) ×2 IMPLANT
TOWEL OR NON WOVEN STRL DISP B (DISPOSABLE) ×2 IMPLANT
TRAY FOLEY CATH 14FRSI W/METER (CATHETERS) ×1 IMPLANT

## 2014-11-06 NOTE — Transfer of Care (Signed)
Immediate Anesthesia Transfer of Care Note  Patient: Victoria Davila  Procedure(s) Performed: Procedure(s): LEFT TOTAL HIP ARTHROPLASTY ANTERIOR APPROACH (Left)  Patient Location: PACU  Anesthesia Type:General  Level of Consciousness: awake, alert , oriented and patient cooperative  Airway & Oxygen Therapy: Patient Spontanous Breathing and Patient connected to face mask oxygen  Post-op Assessment: Report given to PACU RN, Post -op Vital signs reviewed and stable and Patient moving all extremities  Post vital signs: Reviewed and stable  Complications: No apparent anesthesia complications

## 2014-11-06 NOTE — H&P (Signed)
TOTAL HIP ADMISSION H&P  Patient is admitted for left total hip arthroplasty.  Subjective:  Chief Complaint: left hip pain  HPI: Victoria Davila, 59 y.o. female, has a history of pain and functional disability in the left hip(s) due to arthritis and patient has failed non-surgical conservative treatments for greater than 12 weeks to include NSAID's and/or analgesics, corticosteriod injections, flexibility and strengthening excercises, weight reduction as appropriate and activity modification.  Onset of symptoms was gradual starting 5 years ago with gradually worsening course since that time.The patient noted no past surgery on the left hip(s).  Patient currently rates pain in the left hip at 9 out of 10 with activity. Patient has night pain, worsening of pain with activity and weight bearing, pain that interfers with activities of daily living and pain with passive range of motion. Patient has evidence of subchondral sclerosis, periarticular osteophytes and joint space narrowing by imaging studies. This condition presents safety issues increasing the risk of falls.  There is no current active infection.  Patient Active Problem List   Diagnosis Date Noted  . Arthritis of right hip 11/06/2014   Past Medical History  Diagnosis Date  . Hypertension   . Mitral valve disorder     "deformed"  . Dysrhythmia     A FIB / FOLLOWED BY DR Nolon Rod AT [facility]  . Shortness of breath dyspnea     WITH EXERTION  . Seizures     LAST SEIZURE JAN 1995  . Neuromuscular disorder   . Rheumatoid arthritis     RECENT DX - HAS NOT YET SEEN RHEUMATOLOGIST  . Edema of both legs     CHRONIC  . Hyperlipidemia     Past Surgical History  Procedure Laterality Date  . Knee arthroscopy  2012    left  . Abdominal hysterectomy  2012  . Cardiac valve surgery  2001    "heart valve stretched" for mitral valve stenosis    Prescriptions prior to admission  Medication Sig Dispense Refill Last Dose  .  acetaminophen (TYLENOL) 500 MG tablet Take 1,000 mg by mouth every 6 (six) hours as needed for mild pain or moderate pain.     Marland Kitchen aspirin 81 MG tablet Take 81 mg by mouth daily.     . Calcium Carbonate-Vitamin D (CALTRATE 600+D PO) Take 1 tablet by mouth 2 (two) times daily.     . diclofenac (VOLTAREN) 50 MG EC tablet Take 50 mg by mouth 3 (three) times daily as needed for mild pain.     Marland Kitchen losartan (COZAAR) 100 MG tablet Take 100 mg by mouth every morning.     . metoprolol (LOPRESSOR) 50 MG tablet Take 50 mg by mouth every morning.    at 8am  . Multiple Vitamin (MULTIVITAMIN WITH MINERALS) TABS tablet Take 1 tablet by mouth at bedtime.     . Omega 3 1000 MG CAPS Take 1 capsule by mouth 2 (two) times daily.     . phenytoin (DILANTIN) 100 MG ER capsule Take 400 mg by mouth at bedtime.     . potassium chloride (K-DUR,KLOR-CON) 10 MEQ tablet Take 10 mEq by mouth at bedtime.     . psyllium (REGULOID) 0.52 G capsule Take 0.52 g by mouth 2 (two) times daily.     Marland Kitchen torsemide (DEMADEX) 20 MG tablet Take 20 mg by mouth at bedtime.     Marland Kitchen warfarin (COUMADIN) 3 MG tablet Take 6 mg by mouth at bedtime.  No Known Allergies  History  Substance Use Topics  . Smoking status: Never Smoker   . Smokeless tobacco: Not on file  . Alcohol Use: Yes     Comment: OCCASIONAL WINE ONLY    No family history on file.   Review of Systems  Musculoskeletal: Positive for back pain and joint pain.  All other systems reviewed and are negative.   Objective:  Physical Exam  Constitutional: She is oriented to person, place, and time. She appears well-developed and well-nourished.  HENT:  Head: Normocephalic and atraumatic.  Eyes: EOM are normal. Pupils are equal, round, and reactive to light.  Neck: Normal range of motion. Neck supple.  Cardiovascular: Normal rate and regular rhythm.   Respiratory: Effort normal and breath sounds normal.  GI: Soft. Bowel sounds are normal.  Musculoskeletal:       Left hip: She  exhibits decreased range of motion, decreased strength and bony tenderness.  Neurological: She is alert and oriented to person, place, and time.  Skin: Skin is warm and dry.  Psychiatric: She has a normal mood and affect.    Vital signs in last 24 hours: Temp:  [98 F (36.7 C)] 98 F (36.7 C) (01/08 0714) Pulse Rate:  [70] 70 (01/08 0714) Resp:  [16] 16 (01/08 0714) BP: (142)/(78) 142/78 mmHg (01/08 0714) SpO2:  [100 %] 100 % (01/08 0714)  Labs:   There is no weight on file to calculate BMI.   Imaging Review Plain radiographs demonstrate severe degenerative joint disease of the left hip(s). The bone quality appears to be good for age and reported activity level.  Assessment/Plan:  End stage arthritis, left hip(s)  The patient history, physical examination, clinical judgement of the provider and imaging studies are consistent with end stage degenerative joint disease of the left hip(s) and total hip arthroplasty is deemed medically necessary. The treatment options including medical management, injection therapy, arthroscopy and arthroplasty were discussed at length. The risks and benefits of total hip arthroplasty were presented and reviewed. The risks due to aseptic loosening, infection, stiffness, dislocation/subluxation,  thromboembolic complications and other imponderables were discussed.  The patient acknowledged the explanation, agreed to proceed with the plan and consent was signed. Patient is being admitted for inpatient treatment for surgery, pain control, PT, OT, prophylactic antibiotics, VTE prophylaxis, progressive ambulation and ADL's and discharge planning.The patient is planning to be discharged home with home health services

## 2014-11-06 NOTE — Anesthesia Preprocedure Evaluation (Addendum)
Anesthesia Evaluation  Patient identified by MRN, date of birth, ID band Patient awake    Reviewed: Allergy & Precautions, NPO status , Patient's Chart, lab work & pertinent test results  Airway Mallampati: II  TM Distance: >3 FB Neck ROM: Full    Dental no notable dental hx.    Pulmonary neg pulmonary ROS,  breath sounds clear to auscultation  Pulmonary exam normal       Cardiovascular hypertension, Pt. on medications and Pt. on home beta blockers + dysrhythmias Atrial Fibrillation + Valvular Problems/Murmurs (mitral stenosis) Rhythm:Regular Rate:Normal     Neuro/Psych Seizures -, Well Controlled,  negative psych ROS   GI/Hepatic negative GI ROS, Neg liver ROS,   Endo/Other  Morbid obesity  Renal/GU negative Renal ROS  negative genitourinary   Musculoskeletal  (+) Arthritis -, Rheumatoid disorders,  Chronic bilat LE edema   Abdominal   Peds negative pediatric ROS (+)  Hematology negative hematology ROS (+)   Anesthesia Other Findings   Reproductive/Obstetrics negative OB ROS                         Anesthesia Physical Anesthesia Plan  ASA: III  Anesthesia Plan: General   Post-op Pain Management:    Induction: Intravenous  Airway Management Planned: Oral ETT  Additional Equipment:   Intra-op Plan:   Post-operative Plan: Extubation in OR  Informed Consent: I have reviewed the patients History and Physical, chart, labs and discussed the procedure including the risks, benefits and alternatives for the proposed anesthesia with the patient or authorized representative who has indicated his/her understanding and acceptance.   Dental advisory given  Plan Discussed with: CRNA  Anesthesia Plan Comments:         Anesthesia Quick Evaluation

## 2014-11-06 NOTE — Brief Op Note (Signed)
11/06/2014  11:01 AM  PATIENT:  Victoria Davila  59 y.o. female  PRE-OPERATIVE DIAGNOSIS:  osteoarthritis left hip  POST-OPERATIVE DIAGNOSIS:  osteoarthritis left hip  PROCEDURE:  Procedure(s): LEFT TOTAL HIP ARTHROPLASTY ANTERIOR APPROACH (Left)  SURGEON:  Surgeon(s) and Role:    * Kathryne Hitch, MD - Primary  PHYSICIAN ASSISTANT: Rexene Edison, PA-C  ANESTHESIA:   general  EBL:  Total I/O In: 1000 [I.V.:1000] Out: 275 [Urine:125; Blood:150]  BLOOD ADMINISTERED:none  DRAINS: none   LOCAL MEDICATIONS USED:  NONE  SPECIMEN:  No Specimen  DISPOSITION OF SPECIMEN:  N/A  COUNTS:  YES  TOURNIQUET:  * No tourniquets in log *  DICTATION: .Other Dictation: Dictation Number [id number]  PLAN OF CARE: Admit to inpatient   PATIENT DISPOSITION:  PACU - hemodynamically stable.   Delay start of Pharmacological VTE agent (>24hrs) due to surgical blood loss or risk of bleeding: no

## 2014-11-06 NOTE — Progress Notes (Signed)
[facility]  [facility] is providing the following services: patient declined rw - has access to one at home.   If patient discharges after hours, please call [phone].   Renard Hamper 11/06/2014, 3:17 PM

## 2014-11-06 NOTE — Progress Notes (Signed)
PT Cancellation Note  Patient Details Name: Victoria Davila MRN: [id number] DOB: 1956-06-12   Cancelled Treatment:     PT POD 0 eval deferred 2* N&V.  Will follow in am.   [name] 11/06/2014, 3:22 PM

## 2014-11-06 NOTE — Anesthesia Procedure Notes (Signed)
Procedure Name: Intubation Date/Time: 11/06/2014 9:44 AM Performed by: Jarvis Newcomer A Pre-anesthesia Checklist: Patient being monitored, Suction available, Emergency Drugs available, Patient identified and Timeout performed Patient Re-evaluated:Patient Re-evaluated prior to inductionOxygen Delivery Method: Circle system utilized Preoxygenation: Pre-oxygenation with 100% oxygen Intubation Type: IV induction Ventilation: Mask ventilation without difficulty Laryngoscope Size: Mac and 4 Grade View: Grade I Tube type: Oral Tube size: 7.5 mm Number of attempts: 1 Airway Equipment and Method: Stylet Placement Confirmation: ETT inserted through vocal cords under direct vision,  positive ETCO2 and breath sounds checked- equal and bilateral Secured at: 19 cm Tube secured with: Tape Dental Injury: Teeth and Oropharynx as per pre-operative assessment

## 2014-11-06 NOTE — Progress Notes (Signed)
ANTICOAGULATION CONSULT NOTE - Initial Consult  Pharmacy Consult for warfarin Indication: atrial fibrillation; s/p L THA 11/06/14  No Known Allergies  Patient Measurements: Height: 5\' 6"  (167.6 cm) Weight: 250 lb (113.399 kg) IBW/kg (Calculated) : 59.3   Vital Signs: Temp: 97.7 F (36.5 C) (01/08 1328) Temp Source: Oral (01/08 1328) BP: 121/53 mmHg (01/08 1328) Pulse Rate: 63 (01/08 1328)  Labs:  Recent Labs  11/06/14 0755  LABPROT 13.8  INR 1.05    Estimated Creatinine Clearance: 93.2 mL/min (by C-G formula based on Cr of 0.83).   Medical History: Past Medical History  Diagnosis Date  . Hypertension   . Mitral valve disorder     "deformed"  . Dysrhythmia     A FIB / FOLLOWED BY DR 01/05/15 AT [facility]  . Shortness of breath dyspnea     WITH EXERTION  . Seizures     LAST SEIZURE JAN 1995  . Neuromuscular disorder   . Rheumatoid arthritis     RECENT DX - HAS NOT YET SEEN RHEUMATOLOGIST  . Edema of both legs     CHRONIC  . Hyperlipidemia     Medications:  Scheduled:  . aspirin  81 mg Oral Daily  .  ceFAZolin (ANCEF) IV  2 g Intravenous Q6H  . docusate sodium  100 mg Oral BID  . hydrALAZINE      . HYDROmorphone      . losartan  100 mg Oral q morning - 10a  . metoprolol  50 mg Oral q morning - 10a  . multivitamin with minerals  1 tablet Oral QHS  . phenytoin  400 mg Oral QHS  . potassium chloride  10 mEq Oral QHS  . psyllium  0.52 g Oral BID  . torsemide  20 mg Oral QHS   Infusions:  . sodium chloride 75 mL/hr (11/06/14 1355)  . lactated ringers     PRN: acetaminophen **OR** acetaminophen, alum & mag hydroxide-simeth, diphenhydrAMINE, HYDROmorphone (DILAUDID) injection, HYDROmorphone (DILAUDID) injection, menthol-cetylpyridinium **OR** phenol, meperidine (DEMEROL) injection, methocarbamol **OR** methocarbamol (ROBAXIN)  IV, metoCLOPramide **OR** metoCLOPramide (REGLAN) injection, ondansetron **OR** ondansetron (ZOFRAN) IV, oxyCODONE, polyethylene  glycol, promethazine, zolpidem  Assessment: 59 y/o F on chronic warfarin for atrial fibrillation, required interruption in anticoagulation for L THA, which was performed 11/06/14.  Orders received for pharmacy to assist with resuming warfarin.  Per medication history, home warfarin dosage is 6mg  daily at bedtime, last taken 10/31/14  INR on 10/29/14 was 2.0.  INR this AM was down to WNL.  ASA 81 mg daily noted (as taken prior to admission).  Dilantin 400 mg qhs noted (as taken prior to admission)   Goal of Therapy:  INR 2-3    Plan:  1. Warfarin 7.5 mg PO x 1 tonight 2. PT / INR daily while inpatient 3. Follow clinical course.  12/30/14, PharmD, BCPS Pager: [phone] 11/06/2014  2:09 PM

## 2014-11-06 NOTE — Progress Notes (Signed)
Utilization review completed.  

## 2014-11-06 NOTE — Anesthesia Postprocedure Evaluation (Signed)
  Anesthesia Post-op Note  Patient: Victoria Davila  Procedure(s) Performed: Procedure(s) (LRB): LEFT TOTAL HIP ARTHROPLASTY ANTERIOR APPROACH (Left)  Patient Location: PACU  Anesthesia Type: General  Level of Consciousness: awake and alert   Airway and Oxygen Therapy: Patient Spontanous Breathing  Post-op Pain: mild  Post-op Assessment: Post-op Vital signs reviewed, Patient's Cardiovascular Status Stable, Respiratory Function Stable, Patent Airway and No signs of Nausea or vomiting  Last Vitals:  Filed Vitals:   11/06/14 1328  BP: 121/53  Pulse: 63  Temp: 36.5 C  Resp: 12    Post-op Vital Signs: stable   Complications: No apparent anesthesia complications

## 2014-11-07 LAB — CBC
HCT: 34.6 % — ABNORMAL LOW (ref 36.0–46.0)
Hemoglobin: 11.4 g/dL — ABNORMAL LOW (ref 12.0–15.0)
MCH: 30.7 pg (ref 26.0–34.0)
MCHC: 32.9 g/dL (ref 30.0–36.0)
MCV: 93.3 fL (ref 78.0–100.0)
Platelets: 162 10*3/uL (ref 150–400)
RBC: 3.71 MIL/uL — ABNORMAL LOW (ref 3.87–5.11)
RDW: 14 % (ref 11.5–15.5)
WBC: 8.5 10*3/uL (ref 4.0–10.5)

## 2014-11-07 LAB — BASIC METABOLIC PANEL
ANION GAP: 11 (ref 5–15)
BUN: 12 mg/dL (ref 6–23)
CO2: 32 mmol/L (ref 19–32)
Calcium: 8 mg/dL — ABNORMAL LOW (ref 8.4–10.5)
Chloride: 97 mEq/L (ref 96–112)
Creatinine, Ser: 0.7 mg/dL (ref 0.50–1.10)
Glucose, Bld: 115 mg/dL — ABNORMAL HIGH (ref 70–99)
Potassium: 3.2 mmol/L — ABNORMAL LOW (ref 3.5–5.1)
Sodium: 140 mmol/L (ref 135–145)

## 2014-11-07 LAB — PROTIME-INR
INR: 1.15 (ref 0.00–1.49)
Prothrombin Time: 14.8 seconds (ref 11.6–15.2)

## 2014-11-07 MED ORDER — METHOCARBAMOL 500 MG PO TABS: 500.0000 mg | ORAL_TABLET | Freq: Four times a day (QID) | ORAL | Status: DC | PRN

## 2014-11-07 MED ORDER — OXYCODONE-ACETAMINOPHEN 5-325 MG PO TABS: 1.0000 | ORAL_TABLET | ORAL | Status: DC | PRN

## 2014-11-07 MED ORDER — POTASSIUM CHLORIDE CRYS ER 20 MEQ PO TBCR
20.0000 meq | EXTENDED_RELEASE_TABLET | Freq: Three times a day (TID) | ORAL | Status: DC
  Administered 2014-11-07 – 2014-11-09 (×7): 20 meq via ORAL
  Filled 2014-11-07 (×9): qty 1

## 2014-11-07 MED ORDER — WARFARIN SODIUM 7.5 MG PO TABS
7.5000 mg | ORAL_TABLET | Freq: Once | ORAL | Status: AC
  Administered 2014-11-07: 7.5 mg via ORAL
  Filled 2014-11-07: qty 1

## 2014-11-07 NOTE — Op Note (Signed)
NAMEQUINCY, PRISCO NO.:  [id number]  MEDICAL RECORD NO.:  [id number]  LOCATION:  1607                         FACILITY:  [facility]  PHYSICIAN:  Vanita Panda. Magnus Ivan, M.D.DATE OF BIRTH:  03-09-1956  DATE OF PROCEDURE:  11/06/2014 DATE OF DISCHARGE:                              OPERATIVE REPORT   PREOPERATIVE DIAGNOSES:  Severe primary osteoarthritis and degenerative joint disease, left hip.  POSTOPERATIVE DIAGNOSES:  Severe primary osteoarthritis and degenerative joint disease, left hip.  PROCEDURE:  Left total hip arthroplasty through direct anterior approach.  IMPLANTS:  DePuy Sector Gription acetabular component size 48, size 32+ 4 neutral polyethylene liner, size 10 Corail femoral component with standard offset, size 32+ 1 ceramic hip ball.  SURGEON:  Vanita Panda. Magnus Ivan, M.D.  ASSISTANT:  Richardean Canal, PA-C  ANESTHESIA:  General.  ANTIBIOTICS:  2 g of IV Ancef.  BLOOD LOSS:  150 mL.  COMPLICATIONS:  None.  INDICATIONS:  Victoria Davila is a 59 year old female well-known to me.  She has had significant history of left hip pain where it has gotten to where her activities of daily living are severely limited.  Her quality of life and mobility are impacted as well detrimentally.  She has x-rays that show complete loss of her joint space of the left hip.  There are peritracheal osteophytes and significant sclerotic changes as well.  She has failed all forms of conservative treatment including intra-articular steroid injection into her hip.  At this point, she wished to proceed with a total hip arthroplasty.  She understands the risk of acute blood loss anemia, nerve and vessel injury, fracture, infection, dislocation, DVT.  She understands the goals of decreased pain, improved mobility, and overall improved quality of life.  PROCEDURE DESCRIPTION:  After informed consent was obtained, appropriate left hip was marked.  She was brought to the  operating room and general anesthesia was obtained while she was on her stretcher.  A Foley catheter was placed and both feet had traction boots applied to them. Next, she was placed supine on the HANA fracture table with the perineal post in place and both legs in inline skeletal traction devices, but no traction applied.  Her left operative hip was prepped and draped with DuraPrep and sterile drapes.  A time-out was called.  She was identified as correct patient, correct left hip.  I then made an incision posterior and inferior to the anterosuperior iliac spine and carried this obliquely down the leg.  I dissected out the tensor fascia lata muscle, the tensor fascia was then divided longitudinally, so I could proceed with a direct anterior approach to the hip.  We identified and cauterized the lateral femoral circumflex vessels and then placed Cobra retractors around the lateral neck and up underneath the rectus femoris on the medial femoral neck.  We then opened up the hip capsule in a L- type format exposing the hip joint and placed the Cobra retractors within the hip capsule.  We then made our femoral neck cut with an oscillating saw proximal to the lesser trochanter and completed this with an osteotome.  I placed a corkscrew guide in the femoral head and removed the femoral head in  its entirety without difficulty.  It was devoid of cartilage.  We then cleaned the acetabulum of debris including remnants of the of acetabular labrum.  I placed a bent Hohmann medially and a Cobra retractor laterally and then began reaming under direct visualization in 2 mm increments from size 42 only up to a size 48 given her size.  The last reamer was also placed under direct fluoroscopy so I could obtain our depth of reaming, our inclination, and anteversion.  I then placed the real DePuy Sector Gription acetabular component size 48 in the apex hole guide and a 32+ 4 neutral polyethylene liner for a  size 48 acetabular component.  Attention was then turned to the femur.  With the leg externally rotated to 100 degrees, extended, and adducted, I was able to place a bent Hohmann over the medial acetabular rim and a Cobra retractor laterally.  With Mueller retractor medially and a Hohmann retractor behind the greater trochanter, I released the lateral joint capsule and used a box cutting osteotome to enter the femoral canal and a rongeur to lateralize.  I then began broaching from a size 8 broach using the Corail broaching system up to a size 10, the 10 had a nice solid fit to it, so we trialed a standard neck and a 32+ 1 hip ball.  We brought the leg back over and up.  With traction and internal rotation, reduced the pelvis and it was stable on rotation with minimal shuck and leg lengths and offset measured near equal under direct fluoroscopy.  We then dislocated the hip and removed the trial components and placed the real Corail femoral component with standard offset and the real 32+ 1 ceramic hip ball and reduced this back into the acetabulum and again it was stable.  We were very pleased with this and so we copiously irrigated the soft tissues with normal saline solution using pulsatile lavage.  I closed the joint capsule with interrupted #1 Ethibond suture followed by running #1 Vicryl in the tensor fascia, 0-Vicryl in the deep tissue, 2-0 Vicryl in the subcutaneous tissue, staples on the skin. Aquacel dressing was applied.  She was then taken off the HANA table, awakened, extubated, and taken to the recovery room in a stable condition.  All final counts were correct.  There were no complications noted.     Vanita Panda. Magnus Ivan, M.D.     CYB/MEDQ  D:  11/06/2014  T:  11/07/2014  Job:  [id number]

## 2014-11-07 NOTE — Evaluation (Signed)
Physical Therapy Evaluation Patient Details Name: Victoria Davila MRN: [id number] DOB: 29-Oct-1956 Today's Date: 11/07/2014   History of Present Illness  L THR  Clinical Impression  Pt s/p L THR presents with decreased L LE strength/ROM, premorbid deconditioning and post op pain limiting functional mobility.  Pt should progress to d/c home with family assist and HHPT follow up.    Follow Up Recommendations Home health PT    Equipment Recommendations  Rolling walker with 5" wheels    Recommendations for Other Services OT consult     Precautions / Restrictions Precautions Precautions: Fall Restrictions Weight Bearing Restrictions: No Other Position/Activity Restrictions: WBAT      Mobility  Bed Mobility Overal bed mobility: Needs Assistance Bed Mobility: Supine to Sit     Supine to sit: Mod assist     General bed mobility comments: cues for sequence and use of R LE to self assist.  Physical assist to manage L LE and to bring trunk to upright  Transfers Overall transfer level: Needs assistance Equipment used: Rolling walker (2 wheeled) Transfers: Sit to/from Stand Sit to Stand: Mod assist;+2 physical assistance         General transfer comment: cues for LE management and use of UEs to self assist  Ambulation/Gait Ambulation/Gait assistance: Min assist;Mod assist;+2 physical assistance;+2 safety/equipment Ambulation Distance (Feet): 18 Feet Assistive device: Rolling walker (2 wheeled) Gait Pattern/deviations: Step-to pattern;Decreased step length - right;Decreased step length - left;Shuffle;Trunk flexed Gait velocity: decreased   General Gait Details: cues for sequence, posture and position from RW.  Assist for balance, RW management and initially to advance L LE  Stairs            Wheelchair Mobility    Modified Rankin (Stroke Patients Only)       Balance                                             Pertinent Vitals/Pain  Pain Assessment: 0-10 Pain Score: 6  Pain Location: L hip Pain Descriptors / Indicators: Aching;Burning Pain Intervention(s): Limited activity within patient's tolerance;Monitored during session;Premedicated before session;Ice applied    Home Living Family/patient expects to be discharged to:: Private residence Living Arrangements: Spouse/significant other Available Help at Discharge: Family Type of Home: House Home Access: Stairs to enter Entrance Stairs-Rails: Right Entrance Stairs-Number of Steps: 6 Home Layout: One level Home Equipment: Walker - 4 wheels;Cane - single point      Prior Function Level of Independence: Independent               Hand Dominance   Dominant Hand: Right    Extremity/Trunk Assessment   Upper Extremity Assessment: RUE deficits/detail RUE Deficits / Details: Limited shoulder flex/abd/elevation         Lower Extremity Assessment: LLE deficits/detail   LLE Deficits / Details: 2/5 hip strength with AAROM at hip to 75 flex and 15 abd     Communication   Communication: No difficulties  Cognition Arousal/Alertness: Awake/alert Behavior During Therapy: WFL for tasks assessed/performed Overall Cognitive Status: Within Functional Limits for tasks assessed                      General Comments      Exercises Total Joint Exercises Ankle Circles/Pumps: AROM;Both;10 reps;Supine Quad Sets: AROM;Both;10 reps;Supine Heel Slides: AAROM;Left;20 reps;Supine Hip ABduction/ADduction: AAROM;Left;15 reps;Supine  Assessment/Plan    PT Assessment Patient needs continued PT services  PT Diagnosis Difficulty walking   PT Problem List Decreased strength;Decreased range of motion;Decreased activity tolerance;Decreased mobility;Decreased knowledge of use of DME;Obesity;Pain  PT Treatment Interventions DME instruction;Gait training;Stair training;Functional mobility training;Therapeutic activities;Therapeutic exercise;Patient/family  education   PT Goals (Current goals can be found in the Care Plan section) Acute Rehab PT Goals Patient Stated Goal: Resume previous lifestyle with decreased pain PT Goal Formulation: With patient Time For Goal Achievement: 11/14/14 Potential to Achieve Goals: Good    Frequency 7X/week   Barriers to discharge        Co-evaluation               End of Session Equipment Utilized During Treatment: Gait belt Activity Tolerance: Patient tolerated treatment well Patient left: in chair;with call bell/phone within reach Nurse Communication: Mobility status         Time: 0737-0820 PT Time Calculation (min) (ACUTE ONLY): 43 min   Charges:   PT Evaluation $Initial PT Evaluation Tier I: 1 Procedure PT Treatments $Gait Training: 8-22 mins $Therapeutic Exercise: 8-22 mins   PT G Codes:        Hitomi Slape 11-13-14, 11:33 AM

## 2014-11-07 NOTE — Progress Notes (Signed)
Physical Therapy Treatment Patient Details Name: Victoria Davila MRN: [id number] DOB: 1956/06/08 Today's Date: 11/07/2014    History of Present Illness L THR    PT Comments    Pt moving slowly but progressing with mobility and hoping for d.c tomorrow.  Follow Up Recommendations  Home health PT     Equipment Recommendations  Rolling walker with 5" wheels    Recommendations for Other Services OT consult     Precautions / Restrictions Precautions Precautions: Fall Restrictions Weight Bearing Restrictions: No Other Position/Activity Restrictions: WBAT    Mobility  Bed Mobility               General bed mobility comments: NT - pt requests back to chair  Transfers Overall transfer level: Needs assistance Equipment used: Rolling walker (2 wheeled) Transfers: Sit to/from Stand Sit to Stand: Min assist Stand pivot transfers: Min guard       General transfer comment: cues for hand placement and assist to power up into standing   Ambulation/Gait Ambulation/Gait assistance: Min assist Ambulation Distance (Feet): 70 Feet (and 15' back from bathroom) Assistive device: Rolling walker (2 wheeled) Gait Pattern/deviations: Step-to pattern;Decreased step length - right;Decreased step length - left;Shuffle;Trunk flexed Gait velocity: decreased   General Gait Details: cues for sequence, posture and position from RW.  Assist for balance, RW management and initially to advance L LE   Stairs            Wheelchair Mobility    Modified Rankin (Stroke Patients Only)       Balance                                    Cognition Arousal/Alertness: Awake/alert Behavior During Therapy: WFL for tasks assessed/performed Overall Cognitive Status: Within Functional Limits for tasks assessed                      Exercises      General Comments        Pertinent Vitals/Pain Pain Assessment: 0-10 Pain Score: 4  Pain Location: L hip Pain  Descriptors / Indicators: Aching Pain Intervention(s): Limited activity within patient's tolerance;Monitored during session;Premedicated before session;Ice applied    Home Living Family/patient expects to be discharged to:: Private residence Living Arrangements: Spouse/significant other Available Help at Discharge: Family Type of Home: House Home Access: Stairs to enter Entrance Stairs-Rails: Right Home Layout: One level Home Equipment: Environmental consultant - 4 wheels;Cane - single point;Shower seat - built in;Grab bars - toilet;Grab bars - tub/shower      Prior Function Level of Independence: Needs assistance  Gait / Transfers Assistance Needed: Pt ambulated mod I ADL's / Homemaking Assistance Needed: Pt required assist with LB ADLs     PT Goals (current goals can now be found in the care plan section) Acute Rehab PT Goals Patient Stated Goal: to go home  PT Goal Formulation: With patient Time For Goal Achievement: 11/14/14 Potential to Achieve Goals: Good Progress towards PT goals: Progressing toward goals    Frequency  7X/week    PT Plan Current plan remains appropriate    Co-evaluation             End of Session Equipment Utilized During Treatment: Gait belt Activity Tolerance: Patient tolerated treatment well Patient left: in chair;with call bell/phone within reach     Time: 8466-5993 PT Time Calculation (min) (ACUTE ONLY): 24 min  Charges:  $Gait  Training: 23-37 mins                    G Codes:      Victoria Davila 11/15/2014, 4:51 PM

## 2014-11-07 NOTE — Evaluation (Signed)
Occupational Therapy Evaluation Patient Details Name: Victoria Davila MRN: [id number] DOB: 1956-05-31 Today's Date: 11/07/2014    History of Present Illness L THR   Clinical Impression   Pt admitted with above. She demonstrates the below listed deficits and will benefit from continued OT to maximize safety and independence with BADLs.  Pt able to perform BADLs with min A using AE, and functional transfers with min A.  Pt instructed in use of AE.  Family is supportive and will be able to provide necessary level of assist at discharge.  Will follow.       Follow Up Recommendations  No OT follow up;Supervision/Assistance - 24 hour    Equipment Recommendations  3 in 1 bedside comode    Recommendations for Other Services       Precautions / Restrictions Precautions Precautions: Fall Restrictions Weight Bearing Restrictions: No Other Position/Activity Restrictions: WBAT      Mobility Bed Mobility                  Transfers Overall transfer level: Needs assistance Equipment used: Rolling walker (2 wheeled) Transfers: Sit to/from [employer] Sit to Stand: Min assist Stand pivot transfers: Min guard       General transfer comment: cues for hand placement and assist to power up into standing     Balance                                            ADL Overall ADL's : Needs assistance/impaired Eating/Feeding: Independent;Sitting   Grooming: Wash/dry hands;Wash/dry face;Min guard;Standing   Upper Body Bathing: Set up;Supervision/ safety;Sitting   Lower Body Bathing: Moderate assistance;Sit to/from stand   Upper Body Dressing : Set up;Supervision/safety;Sitting   Lower Body Dressing: Minimal assistance;Sit to/from stand;With adaptive equipment Lower Body Dressing Details (indicate cue type and reason): Pt instructed in use of AE for LB ADLs, and informed where to purchase  Toilet Transfer: Minimal  assistance;Ambulation;Comfort height toilet;BSC;RW   Toileting- Clothing Manipulation and Hygiene: Minimal assistance;Sit to/from stand Toileting - Clothing Manipulation Details (indicate cue type and reason): min A for balance      Functional mobility during ADLs: Minimal assistance;Rolling walker General ADL Comments: Pt moves slowly.  She was instructed in use and acquisition of AE.  Spouse present.      Vision                     Perception     Praxis      Pertinent Vitals/Pain Pain Assessment: 0-10 Pain Score: 3  Pain Location: Lt hip Pain Descriptors / Indicators: Aching Pain Intervention(s): Monitored during session;Premedicated before session;Repositioned;Ice applied     Hand Dominance Right   Extremity/Trunk Assessment Upper Extremity Assessment Upper Extremity Assessment: RUE deficits/detail RUE Deficits / Details: Pt with shoulder elevation limited to ~80*.  She reports she fell in Jan. 2015 and injured Rt. shoulder.  She reports she has undergone OP therapy for her shoulder       Cervical / Trunk Assessment Cervical / Trunk Assessment: Other exceptions Cervical / Trunk Exceptions: Pt with cervical flexion    Communication Communication Communication: No difficulties   Cognition Arousal/Alertness: Awake/alert Behavior During Therapy: WFL for tasks assessed/performed Overall Cognitive Status: Within Functional Limits for tasks assessed  General Comments       Exercises       Shoulder Instructions      Home Living Family/patient expects to be discharged to:: Private residence Living Arrangements: Spouse/significant other Available Help at Discharge: Family Type of Home: House Home Access: Stairs to enter Secretary/administrator of Steps: 6 Entrance Stairs-Rails: Right Home Layout: One level     Bathroom Shower/Tub: Producer, television/film/video: Handicapped height Bathroom Accessibility: Yes How  Accessible: Accessible via walker Home Equipment: Walker - 4 wheels;Cane - single point;Shower seat - built in;Grab bars - toilet;Grab bars - tub/shower          Prior Functioning/Environment Level of Independence: Needs assistance  Gait / Transfers Assistance Needed: Pt ambulated mod I ADL's / Homemaking Assistance Needed: Pt required assist with LB ADLs        OT Diagnosis: Generalized weakness;Acute pain   OT Problem List: Decreased strength;Decreased activity tolerance;Decreased safety awareness;Decreased knowledge of use of DME or AE;Decreased knowledge of precautions;Obesity;Pain;Impaired UE functional use   OT Treatment/Interventions: Self-care/ADL training;DME and/or AE instruction;Therapeutic activities;Patient/family education;Balance training    OT Goals(Current goals can be found in the care plan section) Acute Rehab OT Goals Patient Stated Goal: to go home  OT Goal Formulation: With patient Time For Goal Achievement: 11/14/14 Potential to Achieve Goals: Good ADL Goals Pt Will Perform Grooming: with supervision;standing Pt Will Perform Lower Body Bathing: with supervision;sit to/from stand Pt Will Perform Lower Body Dressing: with supervision;sit to/from stand Pt Will Transfer to Toilet: with supervision;ambulating;regular height toilet;bedside commode;grab bars Pt Will Perform Toileting - Clothing Manipulation and hygiene: with supervision;sit to/from stand Pt Will Perform Tub/Shower Transfer: Shower transfer;with supervision;ambulating;shower seat;rolling walker;grab bars  OT Frequency: Min 2X/week   Barriers to D/C:            Co-evaluation              End of Session Equipment Utilized During Treatment: Engineer, water Communication: Mobility status  Activity Tolerance: Patient tolerated treatment well Patient left: in chair;with call bell/phone within reach;with family/visitor present   Time: 1324-1405 OT Time Calculation (min): 41  min Charges:  OT General Charges $OT Visit: 1 Procedure OT Evaluation $Initial OT Evaluation Tier I: 1 Procedure OT Treatments $Self Care/Home Management : 8-22 mins G-Codes:    Victoria Davila M 11/10/2014, 1:58 PM

## 2014-11-07 NOTE — Progress Notes (Signed)
Subjective: Pt stable - pain ok   Objective: Vital signs in last 24 hours: Temp:  [97.5 F (36.4 C)-100.4 F (38 C)] 100.4 F (38 C) (01/09 0605) Pulse Rate:  [51-74] 74 (01/09 0605) Resp:  [12-21] 16 (01/09 0605) BP: (105-182)/(40-120) 141/48 mmHg (01/09 0605) SpO2:  [93 %-100 %] 100 % (01/09 0605) Weight:  [113.399 kg (250 lb)] 113.399 kg (250 lb) (01/08 1328)  Intake/Output from previous day: 01/08 0701 - 01/09 0700 In: 3620 [P.O.:510; I.V.:3110] Out: 2170 [Urine:2020; Blood:150] Intake/Output this shift:    Exam:  Intact pulses distally Dorsiflexion/Plantar flexion intact  Labs:  Recent Labs  11/07/14 0500  HGB 11.4*    Recent Labs  11/07/14 0500  WBC 8.5  RBC 3.71*  HCT 34.6*  PLT 162    Recent Labs  11/07/14 0500  NA 140  K 3.2*  CL 97  CO2 32  BUN 12  CREATININE 0.70  GLUCOSE 115*  CALCIUM 8.0*    Recent Labs  11/06/14 0755 11/07/14 0500  INR 1.05 1.15    Assessment/Plan: PT and mobilization today - lle -    DEAN,GREGORY SCOTT 11/07/2014, 7:56 AM

## 2014-11-07 NOTE — Progress Notes (Signed)
ANTICOAGULATION CONSULT NOTE - follow up  Pharmacy Consult for warfarin Indication: atrial fibrillation; s/p L THA 11/06/14  No Known Allergies  Patient Measurements: Height: 5\' 6"  (167.6 cm) Weight: 250 lb (113.399 kg) IBW/kg (Calculated) : 59.3   Vital Signs: Temp: 99.8 F (37.7 C) (01/09 0949) Temp Source: Oral (01/09 0949) BP: 134/49 mmHg (01/09 0949) Pulse Rate: 79 (01/09 0949)  Labs:  Recent Labs  11/06/14 0755 11/07/14 0500  HGB  --  11.4*  HCT  --  34.6*  PLT  --  162  LABPROT 13.8 14.8  INR 1.05 1.15  CREATININE  --  0.70    Estimated Creatinine Clearance: 96.7 mL/min (by C-G formula based on Cr of 0.7).   Medical History: Past Medical History  Diagnosis Date  . Hypertension   . Mitral valve disorder     "deformed"  . Dysrhythmia     A FIB / FOLLOWED BY DR 01/06/15 AT [facility]  . Shortness of breath dyspnea     WITH EXERTION  . Seizures     LAST SEIZURE JAN 1995  . Neuromuscular disorder   . Rheumatoid arthritis     RECENT DX - HAS NOT YET SEEN RHEUMATOLOGIST  . Edema of both legs     CHRONIC  . Hyperlipidemia     Medications:  Scheduled:  . aspirin EC  81 mg Oral Daily  . docusate sodium  100 mg Oral BID  . losartan  100 mg Oral q morning - 10a  . metoprolol  50 mg Oral q morning - 10a  . multivitamin with minerals  1 tablet Oral QHS  . phenytoin  400 mg Oral QHS  . potassium chloride  10 mEq Oral QHS  . potassium chloride  20 mEq Oral TID  . psyllium  1 packet Oral BID  . torsemide  20 mg Oral QHS  . Warfarin - Pharmacist Dosing Inpatient   Does not apply q1800   Infusions:  . sodium chloride 75 mL/hr (11/06/14 1355)   PRN: acetaminophen **OR** acetaminophen, alum & mag hydroxide-simeth, diphenhydrAMINE, HYDROmorphone (DILAUDID) injection, menthol-cetylpyridinium **OR** phenol, methocarbamol **OR** methocarbamol (ROBAXIN)  IV, metoCLOPramide **OR** metoCLOPramide (REGLAN) injection, ondansetron **OR** ondansetron (ZOFRAN) IV,  oxyCODONE, polyethylene glycol, zolpidem  Assessment: 59 y/o F on chronic warfarin for atrial fibrillation, required interruption in anticoagulation for L THA, which was performed 11/06/14.  Orders received for pharmacy to assist with resuming warfarin.  Per medication history, home warfarin dosage is 6mg  daily at bedtime, last taken 10/31/14  INR on 10/29/14 was 2.0.  INR this AM was down to WNL.  ASA 81 mg daily noted (as taken prior to admission).  Dilantin 400 mg qhs noted (as taken prior to admission)  Today, 11/07/14:   INR subtherapeutic as expected after 7.5mg  x 1 dose  No reported bleeding  CBC OK  Goal of Therapy:  INR 2-3    Plan:  1. Repeat sarfarin 7.5 mg PO x 1 tonight 2. PT / INR daily while inpatient 3. Follow clinical course.  10/31/14, PharmD, BCPS Pager [phone] 11/07/2014 10:45 AM

## 2014-11-07 NOTE — Care Management (Signed)
CARE MANAGEMENT NOTE 11/07/2014  Patient:  [facility] WHITT   Account Number:  [id number]  Date Initiated:  11/07/2014  Documentation initiated by:  [facility]  Subjective/Objective Assessment:   Left total hip arthroplasty     Action/Plan:   Anticipated DC Date:     Anticipated DC Plan:  HOME W HOME HEALTH SERVICES      DC Planning Services  CM consult      [facility] Choice  HOME HEALTH  DURABLE MEDICAL EQUIPMENT   Choice offered to / List presented to:  C-1 Patient   DME arranged  Levan Hurst      DME agency  [facility]     HH arranged  HH-2 PT      [facility] agency  [facility]   Status of service:  Completed, signed off Medicare Important Message given?   (If response is "NO", the following Medicare IM given date fields will be blank) Date Medicare IM given:   Medicare IM given by:   Date Additional Medicare IM given:   Additional Medicare IM given by:    Discharge Disposition:  HOME W HOME HEALTH SERVICES  Per UR Regulation:    If discussed at Long Length of Stay Meetings, dates discussed:    Comments:  11/07/14 14000 - CM spoke with patient and her husband. She selected [facility] for the DME and home health PT. Fayrene Fearing notified of DME request. Judeth Cornfield at [facility] notified of home health PT request and anticipated d/c date. Patient states she has a raised toilet seat, wheelchair and a walk in shower. Rubie Maid RN BSN CCM [phone]

## 2014-11-08 LAB — CBC
HCT: 32 % — ABNORMAL LOW (ref 36.0–46.0)
HEMOGLOBIN: 10.6 g/dL — AB (ref 12.0–15.0)
MCH: 30.5 pg (ref 26.0–34.0)
MCHC: 33.1 g/dL (ref 30.0–36.0)
MCV: 92.2 fL (ref 78.0–100.0)
PLATELETS: 157 10*3/uL (ref 150–400)
RBC: 3.47 MIL/uL — ABNORMAL LOW (ref 3.87–5.11)
RDW: 13.8 % (ref 11.5–15.5)
WBC: 7.8 10*3/uL (ref 4.0–10.5)

## 2014-11-08 LAB — PROTIME-INR
INR: 1.36 (ref 0.00–1.49)
Prothrombin Time: 16.9 seconds — ABNORMAL HIGH (ref 11.6–15.2)

## 2014-11-08 MED ORDER — WARFARIN SODIUM 7.5 MG PO TABS
7.5000 mg | ORAL_TABLET | Freq: Once | ORAL | Status: AC
  Administered 2014-11-08: 7.5 mg via ORAL
  Filled 2014-11-08 (×2): qty 1

## 2014-11-08 NOTE — Progress Notes (Signed)
Subjective: Pt stable doing well with PT   Objective: Vital signs in last 24 hours: Temp:  [99.3 F (37.4 C)-100.9 F (38.3 C)] 99.3 F (37.4 C) (01/10 0625) Pulse Rate:  [75-99] 99 (01/10 1139) Resp:  [16-20] 20 (01/10 0625) BP: (111-143)/(52-61) 134/59 mmHg (01/10 1139) SpO2:  [95 %-100 %] 100 % (01/10 0525)  Intake/Output from previous day: 01/09 0701 - 01/10 0700 In: 1200 [P.O.:1200] Out: 425 [Urine:425] Intake/Output this shift: Total I/O In: 240 [P.O.:240] Out: -   Exam:  Sensation intact distally Intact pulses distally  Labs:  Recent Labs  11/07/14 0500 11/08/14 0508  HGB 11.4* 10.6*    Recent Labs  11/07/14 0500 11/08/14 0508  WBC 8.5 7.8  RBC 3.71* 3.47*  HCT 34.6* 32.0*  PLT 162 157    Recent Labs  11/07/14 0500  NA 140  K 3.2*  CL 97  CO2 32  BUN 12  CREATININE 0.70  GLUCOSE 115*  CALCIUM 8.0*    Recent Labs  11/07/14 0500 11/08/14 0508  INR 1.15 1.36    Assessment/Plan: Dc home   Sonakshi Rolland SCOTT 11/08/2014, 1:45 PM

## 2014-11-08 NOTE — Progress Notes (Addendum)
CARE MANAGEMENT NOTE 11/08/2014  Patient:  [name]   Account Number:  [id number]  Date Initiated:  11/07/2014  Documentation initiated by:  [facility]  Subjective/Objective Assessment:   Left total hip arthroplasty     Action/Plan:   Anticipated DC Date:  11/08/2014   Anticipated DC Plan:  HOME W HOME HEALTH SERVICES      DC Planning Services  CM consult      [facility] Choice  HOME HEALTH  DURABLE MEDICAL EQUIPMENT   Choice offered to / List presented to:  C-1 Patient   DME arranged  Levan Hurst      DME agency  [facility]     [facility] arranged  HH-2 PT  HH-1 RN      [facility] agency  [facility]   Status of service:  Completed, signed off Medicare Important Message given?   (If response is "NO", the following Medicare IM given date fields will be blank) Date Medicare IM given:   Medicare IM given by:   Date Additional Medicare IM given:   Additional Medicare IM given by:    Discharge Disposition:  HOME W HOME HEALTH SERVICES  Per UR Regulation:    If discussed at Long Length of Stay Meetings, dates discussed:    Comments:  11/08/2014 1645 NCM contacted pharmacy, [facility] to clarify next lab draw for PT/INR.  Contacted [facility] and next PT/INR draw 11/09/2014. [facility] will dose per their protocol. Isidoro Donning RN CCM Case Mgmt phone [phone]  11/08/2014 1400 NCM contacted [facility] to make aware HH RN added for Coumadin management at home. Isidoro Donning RN CCM Case Mgmt phone [phone]  11/07/14 14000 - CM spoke with patient and her husband. She selected [facility] for the DME and home health PT. Fayrene Fearing notified of DME request. Judeth Cornfield at [facility] notified of home health PT request and anticipated d/c date. Patient states she has a raised toilet seat, wheelchair and a walk in shower. Rubie Maid RN BSN CCM [phone]

## 2014-11-08 NOTE — Progress Notes (Signed)
Physical Therapy Treatment Patient Details Name: Victoria Davila MRN: [id number] DOB: 1956-05-17 Today's Date: 11/08/2014    History of Present Illness L THR    PT Comments    Pt progressing steadily but ltd by fatigue this date.  Reviewed stairs and car transfers with pt and spouse.  Pt and spouse verbalizing desire to d.c home this date.  Follow Up Recommendations  Home health PT     Equipment Recommendations  Rolling walker with 5" wheels    Recommendations for Other Services OT consult     Precautions / Restrictions Precautions Precautions: Fall Restrictions Weight Bearing Restrictions: No Other Position/Activity Restrictions: WBAT    Mobility  Bed Mobility               General bed mobility comments: NT pt states plans to stay in recliner initially at home  Transfers Overall transfer level: Needs assistance Equipment used: Rolling walker (2 wheeled) Transfers: Sit to/from Stand Sit to Stand: Min guard         General transfer comment: cues to control descent  Ambulation/Gait Ambulation/Gait assistance: Min guard;Supervision Ambulation Distance (Feet): 75 Feet Assistive device: Rolling walker (2 wheeled) Gait Pattern/deviations: Step-to pattern;Decreased step length - right;Decreased step length - left;Shuffle;Trunk flexed Gait velocity: decreased   General Gait Details: cues for posture and position from RW   Stairs Stairs: Yes Stairs assistance: Min assist Stair Management: One rail Right;Forwards;Step to pattern;With crutches Number of Stairs: 2 General stair comments: cues for sequence and foot/crutch placement - husband present.  Pt declines to attempt single step - reviewed with pt and husband verbally with written instructions provided  Wheelchair Mobility    Modified Rankin (Stroke Patients Only)       Balance                                    Cognition Arousal/Alertness: Awake/alert Behavior During  Therapy: WFL for tasks assessed/performed Overall Cognitive Status: Within Functional Limits for tasks assessed                      Exercises      General Comments        Pertinent Vitals/Pain Pain Assessment: 0-10 Pain Score: 4  Pain Location: L hip and R shoulder Pain Descriptors / Indicators: Sore Pain Intervention(s): Limited activity within patient's tolerance;Monitored during session;Ice applied    Home Living                      Prior Function            PT Goals (current goals can now be found in the care plan section) Acute Rehab PT Goals Patient Stated Goal: to go home  PT Goal Formulation: With patient Time For Goal Achievement: 11/14/14 Potential to Achieve Goals: Good Progress towards PT goals: Progressing toward goals    Frequency  7X/week    PT Plan Current plan remains appropriate    Co-evaluation             End of Session Equipment Utilized During Treatment: Gait belt Activity Tolerance: Patient tolerated treatment well;Patient limited by fatigue Patient left: in chair;with call bell/phone within reach     Time: 1315-1350 PT Time Calculation (min) (ACUTE ONLY): 35 min  Charges:  $Gait Training: 8-22 mins $Therapeutic Activity: 8-22 mins  G Codes:      Eugene Zeiders 11-22-14, 3:41 PM

## 2014-11-08 NOTE — Progress Notes (Signed)
Charge nurse, Luis Abed called CM at [phone] to ask them about setting up home health RN for coumadin management at home before this patient is discharged. Weekend CM stated she would call [facility] and let them know of the need for [facility] RN.

## 2014-11-08 NOTE — Progress Notes (Signed)
Pt temp 101.5, Dr on call notified and acknowledged. Per Md temp is normal post op, Pt may stay tonight and monitor temp. Discussed information with pt and her husband, decided to stay tonight to monitor temp and to address concerns with rounding MD in the AM.

## 2014-11-08 NOTE — Progress Notes (Signed)
Occupational Therapy Treatment Patient Details Name: Ashaya Raftery MRN: [id number] DOB: 1956-01-17 Today's Date: 11/08/2014    History of present illness L THR   OT comments  Pt fatigued after performing sponge bath, dressing, and toileting this am. She had some difficulty with shower transfer and educated on safely of sponge bathing a few extra days until she feels stronger to tolerate a full shower. She had difficulty picking R LE up enough to clear the shower ledge to step back out of shower. Pt and spouse agreeable for pt to sponge bathe a few extra days to get more strength for showering.    Follow Up Recommendations  Supervision/Assistance - 24 hour;No OT follow up    Equipment Recommendations  None recommended by OT (spouse states she has a 3in1)    Recommendations for Other Services      Precautions / Restrictions Precautions Precautions: Fall Restrictions Weight Bearing Restrictions: No Other Position/Activity Restrictions: WBAT       Mobility Bed Mobility                  Transfers Overall transfer level: Needs assistance Equipment used: Rolling walker (2 wheeled) Transfers: Sit to/from Stand Sit to Stand: Min assist         General transfer comment: min assist also to safely descend to chair as she was fatigued and tended to slightly "plop" back.     Balance                                   ADL                           Toilet Transfer: Minimal assistance;Ambulation;RW             General ADL Comments: Min assist to control descent to chair as she tended to plop back a bit. She states she is fatigued from morning activities of toileting and bathing, dressing. Husband present for session. Educated on shower transfer with pt and spouse. Pt stepped into shower with min assist and cues but had some difficulty with picking up R LE to step back out of shower. Husband states she will probably sponge bathe initially  and explained that is a good idea as she needs to be able to safely clear shower ledge to step back out of shower and if she is fatigued from shower this could be a safety concern. Spouse shown how to safely stabiize walker as she steps in and out of shower and he verbalizes understanding of technique. pt states she feels comfortable with all AE and spouse also. THey have access  to borrowing all AE at d/c.       Vision                     Perception     Praxis      Cognition   Behavior During Therapy: [facility] for tasks assessed/performed Overall Cognitive Status: Within Functional Limits for tasks assessed                       Extremity/Trunk Assessment               Exercises     Shoulder Instructions       General Comments      Pertinent Vitals/ Pain  Pain Assessment: 0-10 Pain Score: 3  Pain Location: L hip Pain Descriptors / Indicators: Aching Pain Intervention(s): Repositioned  Home Living                                          Prior Functioning/Environment              Frequency Min 2X/week     Progress Toward Goals  OT Goals(current goals can now be found in the care plan section)  Progress towards OT goals: Progressing toward goals     Plan Discharge plan remains appropriate    Co-evaluation                 End of Session Equipment Utilized During Treatment: Rolling walker   Activity Tolerance Patient limited by fatigue   Patient Left in chair;with call bell/phone within reach;with family/visitor present   Nurse Communication          Time: 6440-3474 OT Time Calculation (min): 15 min  Charges: OT General Charges $OT Visit: 1 Procedure OT Treatments $Therapeutic Activity: 8-22 mins  Lennox Laity  [phone] 11/08/2014, 10:47 AM

## 2014-11-08 NOTE — Progress Notes (Signed)
ANTICOAGULATION CONSULT NOTE - follow up  Pharmacy Consult for warfarin Indication: atrial fibrillation; s/p L THA 11/06/14  No Known Allergies  Patient Measurements: Height: 5\' 6"  (167.6 cm) Weight: 250 lb (113.399 kg) IBW/kg (Calculated) : 59.3   Vital Signs: Temp: 99.3 F (37.4 C) (01/10 0625) Temp Source: Oral (01/10 0625) BP: 124/61 mmHg (01/10 0625) Pulse Rate: 84 (01/10 0625)  Labs:  Recent Labs  11/06/14 0755 11/07/14 0500 11/08/14 0508  HGB  --  11.4* 10.6*  HCT  --  34.6* 32.0*  PLT  --  162 157  LABPROT 13.8 14.8 16.9*  INR 1.05 1.15 1.36  CREATININE  --  0.70  --     Estimated Creatinine Clearance: 96.7 mL/min (by C-G formula based on Cr of 0.7).   Medical History: Past Medical History  Diagnosis Date  . Hypertension   . Mitral valve disorder     "deformed"  . Dysrhythmia     A FIB / FOLLOWED BY DR 01/07/15 AT [facility]  . Shortness of breath dyspnea     WITH EXERTION  . Seizures     LAST SEIZURE JAN 1995  . Neuromuscular disorder   . Rheumatoid arthritis     RECENT DX - HAS NOT YET SEEN RHEUMATOLOGIST  . Edema of both legs     CHRONIC  . Hyperlipidemia     Medications:  Scheduled:  . aspirin EC  81 mg Oral Daily  . docusate sodium  100 mg Oral BID  . losartan  100 mg Oral q morning - 10a  . metoprolol  50 mg Oral q morning - 10a  . multivitamin with minerals  1 tablet Oral QHS  . phenytoin  400 mg Oral QHS  . potassium chloride  20 mEq Oral TID  . psyllium  1 packet Oral BID  . torsemide  20 mg Oral QHS  . Warfarin - Pharmacist Dosing Inpatient   Does not apply q1800   Infusions:  . sodium chloride 75 mL/hr (11/06/14 1355)   PRN: acetaminophen **OR** acetaminophen, alum & mag hydroxide-simeth, diphenhydrAMINE, HYDROmorphone (DILAUDID) injection, menthol-cetylpyridinium **OR** phenol, methocarbamol **OR** methocarbamol (ROBAXIN)  IV, metoCLOPramide **OR** metoCLOPramide (REGLAN) injection, ondansetron **OR** ondansetron (ZOFRAN) IV,  oxyCODONE, polyethylene glycol, zolpidem  Assessment: 59 y/o F on chronic warfarin for atrial fibrillation, required interruption in anticoagulation for L THA, which was performed 11/06/14.  Orders received for pharmacy to assist with resuming warfarin.  Per medication history, home warfarin dosage is 6mg  daily at bedtime, last taken 10/31/14  INR on 10/29/14 was 2.0.  INR this AM was down to WNL.  ASA 81 mg daily noted (as taken prior to admission).  Dilantin 400 mg qhs noted (as taken prior to admission)  Today, 11/08/2014:   INR subtherapeutic as expected after 7.5mg  x 2 doses but starting to respond  No reported bleeding  CBC OK  Goal of Therapy:  INR 2-3    Plan:  1. Repeat warfarin 7.5 mg PO x 1 tonight 2. PT / INR daily while inpatient 3. Follow clinical course.  10/31/14, PharmD, BCPS Pager [phone] 11/08/2014 10:34 AM

## 2014-11-08 NOTE — Discharge Instructions (Signed)
Hip Rehabilitation, Guidelines Following Surgery  The results of a hip operation are greatly improved after range of motion and muscle strengthening exercises. Follow all safety measures which are given to protect your hip. If any of these exercises cause increased pain or swelling in your joint, decrease the amount until you are comfortable again. Then slowly increase the exercises. Call your caregiver if you have problems or questions.  HOME CARE INSTRUCTIONS   Most of the following instructions are designed to prevent the dislocation of your new hip.  · Do not put on socks or shoes without following the instructions of your caregivers.  · Sit on high chairs so your hips are not bent more than 90 degrees.  · Sit on chairs with arms. Use the chair arms to help push yourself up when arising.  · Keep your leg on the side of the operation out in front of you when standing up.  · Arrange for the use of a toilet seat elevator so you are not sitting low.  · Do not do any exercises or get in any positions that cause your toes to point in (pigeon toed).  · Always sleep with a pillow between your legs. Do not lie on your side in sleep with both knees touching the bed.  · You may resume a sexual relationship in one month or when given the OK by your caregiver.  · Use crutches or walker as long as suggested by your caregivers.  · Begin weight bearing with your caregiver's approval.  · Avoid periods of inactivity such as sitting longer than an hour when not asleep. This helps prevent blood clots.  · Return to work as instructed by your caregiver.  · Do not drive a car for 6 weeks or as instructed.  · Do not drive while taking narcotics.  · Wear elastic stockings until instructed not to.  · Make sure you keep all of your appointments after your operation with all of your doctors and caregivers.  RANGE OF MOTION AND STRENGTHENING EXERCISES  These exercises are designed to help you keep full movement of your hip joint. Follow  your caregiver's or physical therapist's instructions. Perform all exercises about fifteen times, three times per day or as directed. Exercise both hips, even if you have had only one joint replacement. These exercises can be done on a training (exercise) mat, on the floor, on a table or on a bed. Use whatever works the best and is most comfortable for you. Use music or television while you are exercising so that the exercises are a pleasant break in your day. This will make your life better with the exercises acting as a break in routine you can look forward to.  · Lying on your back, slowly slide your foot toward your buttocks, raising your knee up off the floor. Then slowly slide your foot back down until your leg is straight again.  · Lying on your back spread your legs as far apart as you can without causing discomfort.  · Lying on your side, raise your upper leg and foot straight up from the floor as far as is comfortable. Slowly lower the leg and repeat.  · Lying on your back, tighten up the muscle in the front of your thigh (quadriceps muscles). You can do this by keeping your leg straight and trying to raise your heel off the floor. This helps strengthen the largest muscle supporting your knee.  · Lying on your back, tighten up   the muscles of your buttocks both with the legs straight and with the knee bent at a comfortable angle while keeping your heel on the floor.  · Lying on your stomach, lift your toes off the floor towards your buttocks. Bend your knee as far as is comfortable. Tighten the muscles in the buttocks while doing this.  Document Released: 05/19/2004 Document Revised: 01/08/2012 Document Reviewed: 01/15/2014  Exit[employer]® Patient Information ©2015 [employer]. This information is not intended to replace advice given to you by your health care provider. Make sure you discuss any questions you have with your health care provider.

## 2014-11-09 LAB — PROTIME-INR
INR: 1.6 — AB (ref 0.00–1.49)
PROTHROMBIN TIME: 19.2 s — AB (ref 11.6–15.2)

## 2014-11-09 LAB — CBC
HCT: 30.8 % — ABNORMAL LOW (ref 36.0–46.0)
HEMOGLOBIN: 10.2 g/dL — AB (ref 12.0–15.0)
MCH: 30.5 pg (ref 26.0–34.0)
MCHC: 33.1 g/dL (ref 30.0–36.0)
MCV: 92.2 fL (ref 78.0–100.0)
PLATELETS: 140 10*3/uL — AB (ref 150–400)
RBC: 3.34 MIL/uL — AB (ref 3.87–5.11)
RDW: 13.9 % (ref 11.5–15.5)
WBC: 7.2 10*3/uL (ref 4.0–10.5)

## 2014-11-09 NOTE — Progress Notes (Signed)
Patient ID: Victoria Davila, female   DOB: December 20, 1955, 59 y.o.   MRN: [id number] Doing better overall.  Afebrile with stable vitals.  Labs stable.  Hip stable.  Can discharge to home today.

## 2014-11-09 NOTE — Progress Notes (Signed)
Physical Therapy Treatment Patient Details Name: Victoria Davila MRN: [id number] DOB: 1956/06/12 Today's Date: 11/09/2014    History of Present Illness L THR    PT Comments    Progressing well.  Reviewed car transfers, stairs and curbs with pt and spouse  Follow Up Recommendations  Home health PT     Equipment Recommendations  Rolling walker with 5" wheels    Recommendations for Other Services OT consult     Precautions / Restrictions Precautions Precautions: Fall Restrictions Weight Bearing Restrictions: No Other Position/Activity Restrictions: WBAT    Mobility  Bed Mobility               General bed mobility comments: Pt declines to attempt  Transfers Overall transfer level: Needs assistance Equipment used: Rolling walker (2 wheeled) Transfers: Sit to/from Stand Sit to Stand: Min guard         General transfer comment: cues to control descent  Ambulation/Gait Ambulation/Gait assistance: Min guard;Supervision Ambulation Distance (Feet): 60 Feet Assistive device: Rolling walker (2 wheeled) Gait Pattern/deviations: Step-to pattern;Decreased step length - right;Decreased step length - left;Shuffle;Trunk flexed Gait velocity: decreased   General Gait Details: cues for posture and position from RW   Stairs Stairs: Yes Stairs assistance: Min assist Stair Management: No rails;One rail Right;Step to pattern;Forwards;Backwards;With walker;With crutches Number of Stairs: 6 General stair comments: 1 step fwd and 1 step bkwd with RW.  4 step with crutch and rail.  Husband present and assisting.   Wheelchair Mobility    Modified Rankin (Stroke Patients Only)       Balance                                    Cognition Arousal/Alertness: Awake/alert Behavior During Therapy: WFL for tasks assessed/performed Overall Cognitive Status: Within Functional Limits for tasks assessed                      Exercises Total Joint  Exercises Ankle Circles/Pumps: AROM;Both;Supine;15 reps Quad Sets: AROM;Both;10 reps;Supine Gluteal Sets: AROM;Both;10 reps;Supine Heel Slides: AAROM;Left;20 reps;Supine Hip ABduction/ADduction: AAROM;Left;15 reps;Supine    General Comments        Pertinent Vitals/Pain Pain Assessment: 0-10 Pain Score: 4  Pain Location: L hip Pain Descriptors / Indicators: Aching;Burning;Sore Pain Intervention(s): Limited activity within patient's tolerance;Monitored during session;Premedicated before session    Home Living                      Prior Function            PT Goals (current goals can now be found in the care plan section) Acute Rehab PT Goals Patient Stated Goal: to go home  PT Goal Formulation: With patient Time For Goal Achievement: 11/14/14 Potential to Achieve Goals: Good Progress towards PT goals: Progressing toward goals    Frequency  7X/week    PT Plan Current plan remains appropriate    Co-evaluation             End of Session Equipment Utilized During Treatment: Gait belt Activity Tolerance: Patient tolerated treatment well Patient left: in chair;with call bell/phone within reach     Time: 0930-1018 PT Time Calculation (min) (ACUTE ONLY): 48 min  Charges:  $Gait Training: 8-22 mins $Therapeutic Exercise: 8-22 mins $Therapeutic Activity: 8-22 mins                    G  Codes:      Victoria Davila 11/09/2014, 12:25 PM

## 2014-11-09 NOTE — Care Management Note (Signed)
    Page 1 of 2   11/09/2014     12:18:18 PM CARE MANAGEMENT NOTE 11/09/2014  Patient:  [facility] WHITT   Account Number:  [id number]  Date Initiated:  11/07/2014  Documentation initiated by:  [facility]  Subjective/Objective Assessment:   Left total hip arthroplasty     Action/Plan:   Anticipated DC Date:  11/09/2014   Anticipated DC Plan:  HOME W HOME HEALTH SERVICES      DC Planning Services  CM consult      [facility] Choice  HOME HEALTH  DURABLE MEDICAL EQUIPMENT   Choice offered to / List presented to:  C-1 Patient   DME arranged  Levan Hurst      DME agency  [facility]     [facility] arranged  HH-2 PT  HH-1 RN      [facility] agency  [facility]   Status of service:  Completed, signed off Medicare Important Message given?   (If response is "NO", the following Medicare IM given date fields will be blank) Date Medicare IM given:   Medicare IM given by:   Date Additional Medicare IM given:   Additional Medicare IM given by:    Discharge Disposition:  HOME W HOME HEALTH SERVICES  Per UR Regulation:  Reviewed for med. necessity/level of care/duration of stay  If discussed at Long Length of Stay Meetings, dates discussed:    Comments:  11/09/14 Lanier Clam RN BSN NCM [phone] d/c home w/[facility] already aware of d/c, & hhc orders.  11/08/2014 1645 NCM contacted pharmacy, [facility] to clarify next lab draw for PT/INR.  Contacted [facility] and next PT/INR draw 11/09/2014. [facility] will dose per their protocol. Isidoro Donning RN CCM Case Mgmt phone [phone]  11/08/2014 1400 NCM contacted [facility] to make aware HH RN added for Coumadin management at home. Isidoro Donning RN CCM Case Mgmt phone [phone]  11/07/14 14000 - CM spoke with patient and her husband. She selected [facility] for the DME and home health PT. Fayrene Fearing notified of DME request. Judeth Cornfield at [facility] notified of home health PT request and anticipated d/c date. Patient states she has a raised toilet seat,  wheelchair and a walk in shower. Rubie Maid RN BSN CCM [phone]

## 2014-11-09 NOTE — Discharge Summary (Signed)
Patient ID: Victoria Davila MRN: [id number] DOB/AGE: 60-31-1957 60 y.o.  Admit date: 11/06/2014 Discharge date: 11/09/2014  Admission Diagnoses:  Principal Problem:   Arthritis of right hip Active Problems:   Status post total replacement of left hip   Discharge Diagnoses:  Same  Past Medical History  Diagnosis Date  . Hypertension   . Mitral valve disorder     "deformed"  . Dysrhythmia     A FIB / FOLLOWED BY DR Nolon Rod AT [facility]  . Shortness of breath dyspnea     WITH EXERTION  . Seizures     LAST SEIZURE JAN 1995  . Neuromuscular disorder   . Rheumatoid arthritis     RECENT DX - HAS NOT YET SEEN RHEUMATOLOGIST  . Edema of both legs     CHRONIC  . Hyperlipidemia     Surgeries: Procedure(s): LEFT TOTAL HIP ARTHROPLASTY ANTERIOR APPROACH on 11/06/2014   Consultants:    Discharged Condition: Improved  Hospital Course: Victoria Davila is an 59 y.o. female who was admitted 11/06/2014 for operative treatment ofArthritis of right hip. Patient has severe unremitting pain that affects sleep, daily activities, and work/hobbies. After pre-op clearance the patient was taken to the operating room on 11/06/2014 and underwent  Procedure(s): LEFT TOTAL HIP ARTHROPLASTY ANTERIOR APPROACH.    Patient was given perioperative antibiotics: Anti-infectives    Start     Dose/Rate Route Frequency Ordered Stop   11/06/14 1600  ceFAZolin (ANCEF) IVPB 2 g/50 mL premix     2 g100 mL/hr over 30 Minutes Intravenous Every 6 hours 11/06/14 1338 11/06/14 2201   11/06/14 0715  ceFAZolin (ANCEF) IVPB 2 g/50 mL premix     2 g100 mL/hr over 30 Minutes Intravenous On call to O.R. 11/06/14 0715 11/06/14 0945       Patient was given sequential compression devices, early ambulation, and chemoprophylaxis to prevent DVT.  Patient benefited maximally from hospital stay and there were no complications.    Recent vital signs: Patient Vitals for the past 24 hrs:  BP Temp Temp src Pulse Resp SpO2   11/09/14 0640 - 99.5 F (37.5 C) Oral - - -  11/09/14 0448 116/61 mmHg 99.2 F (37.3 C) Oral 81 20 96 %  11/09/14 0358 - 99.7 F (37.6 C) Oral - - -  11/08/14 2208 (!) 120/53 mmHg 98.8 F (37.1 C) Oral 77 18 99 %  11/08/14 2055 - 99.8 F (37.7 C) Oral - - -  11/08/14 1815 - 99.7 F (37.6 C) - - - -  11/08/14 1559 - (!) 101.3 F (38.5 C) Axillary - - -  11/08/14 1537 - (!) 101.5 F (38.6 C) Oral - - -  11/08/14 1507 120/69 mmHg (!) 101.3 F (38.5 C) Axillary 81 18 97 %  11/08/14 1139 (!) 134/59 mmHg - - 99 - -     Recent laboratory studies:  Recent Labs  11/07/14 0500 11/08/14 0508 11/09/14 0435  WBC 8.5 7.8 7.2  HGB 11.4* 10.6* 10.2*  HCT 34.6* 32.0* 30.8*  PLT 162 157 140*  NA 140  --   --   K 3.2*  --   --   CL 97  --   --   CO2 32  --   --   BUN 12  --   --   CREATININE 0.70  --   --   GLUCOSE 115*  --   --   INR 1.15 1.36 1.60*  CALCIUM 8.0*  --   --  Discharge Medications:     Medication List    STOP taking these medications        diclofenac 50 MG EC tablet  Commonly known as:  VOLTAREN      TAKE these medications        acetaminophen 500 MG tablet  Commonly known as:  TYLENOL  Take 1,000 mg by mouth every 6 (six) hours as needed for mild pain or moderate pain.     aspirin 81 MG tablet  Take 81 mg by mouth daily.     CALTRATE 600+D PO  Take 1 tablet by mouth 2 (two) times daily.     losartan 100 MG tablet  Commonly known as:  COZAAR  Take 100 mg by mouth every morning.     methocarbamol 500 MG tablet  Commonly known as:  ROBAXIN  Take 1 tablet (500 mg total) by mouth every 6 (six) hours as needed for muscle spasms.     metoprolol 50 MG tablet  Commonly known as:  LOPRESSOR  Take 50 mg by mouth every morning.     multivitamin with minerals Tabs tablet  Take 1 tablet by mouth at bedtime.     Omega 3 1000 MG Caps  Take 1 capsule by mouth 2 (two) times daily.     oxyCODONE-acetaminophen 5-325 MG per tablet  Commonly known as:   ROXICET  Take 1-2 tablets by mouth every 4 (four) hours as needed.     phenytoin 100 MG ER capsule  Commonly known as:  DILANTIN  Take 400 mg by mouth at bedtime.     potassium chloride 10 MEQ tablet  Commonly known as:  K-DUR,KLOR-CON  Take 10 mEq by mouth at bedtime.     psyllium 0.52 G capsule  Commonly known as:  REGULOID  Take 0.52 g by mouth 2 (two) times daily.     torsemide 20 MG tablet  Commonly known as:  DEMADEX  Take 20 mg by mouth at bedtime.     warfarin 3 MG tablet  Commonly known as:  COUMADIN  Take 6 mg by mouth at bedtime.        Diagnostic Studies: Dg Chest 2 View  10/29/2014   CLINICAL DATA:  Hypertension, preop for total hip surgery.  EXAM: CHEST  2 VIEW  COMPARISON:  None.  FINDINGS: Heart is borderline in size. No confluent airspace opacities, effusions or edema. Degenerative changes in the thoracic spine.  IMPRESSION: Borderline heart size.  No active disease.   Electronically Signed   By: Charlett Nose M.D.   On: 10/29/2014 09:32   Dg Pelvis Portable  11/06/2014   CLINICAL DATA:  Status post left hip replacement  EXAM: PORTABLE PELVIS 1-2 VIEWS  COMPARISON:  None.  FINDINGS: Left hip arthroplasty is noted. The femoral component is well seated in the acetabular component. No acute abnormality is noted. Degenerative changes of the right hip joint are seen as well.  IMPRESSION: Normal postoperative appearance.   Electronically Signed   By: Alcide Clever M.D.   On: 11/06/2014 12:46   Dg C-arm 1-60 Min-no Report  11/06/2014   CLINICAL DATA:  Left total hip replacement.  EXAM: DG HIP W/ PELVIS 1V*L*; DG C-ARM 1-60 MIN - NRPT [facility]  COMPARISON:  None.  FINDINGS: Two intraoperative fluoroscopic images of the left hip were submitted for review. These demonstrate the patient the status post left total hip arthroplasty. The femoral and acetabular components are well situated.  IMPRESSION: Status post left total hip  arthroplasty.   Electronically Signed   By: Roque Lias  M.D.   On: 11/06/2014 11:00   Dg Hip Unilat With Pelvis 1v Left  11/06/2014   CLINICAL DATA:  Left total hip replacement.  EXAM: DG HIP W/ PELVIS 1V*L*; DG C-ARM 1-60 MIN - NRPT [facility]  COMPARISON:  None.  FINDINGS: Two intraoperative fluoroscopic images of the left hip were submitted for review. These demonstrate the patient the status post left total hip arthroplasty. The femoral and acetabular components are well situated.  IMPRESSION: Status post left total hip arthroplasty.   Electronically Signed   By: Roque Lias M.D.   On: 11/06/2014 11:00    Disposition:  To home      Discharge Instructions    Call MD / Call 911    Complete by:  As directed   If you experience chest pain or shortness of breath, CALL 911 and be transported to the hospital emergency room.  If you develope a fever above 101 F, pus (white drainage) or increased drainage or redness at the wound, or calf pain, call your surgeon's office.     Call MD / Call 911    Complete by:  As directed   If you experience chest pain or shortness of breath, CALL 911 and be transported to the hospital emergency room.  If you develope a fever above 101 F, pus (white drainage) or increased drainage or redness at the wound, or calf pain, call your surgeon's office.     Constipation Prevention    Complete by:  As directed   Drink plenty of fluids.  Prune juice may be helpful.  You may use a stool softener, such as Colace (over the counter) 100 mg twice a day.  Use MiraLax (over the counter) for constipation as needed.     Constipation Prevention    Complete by:  As directed   Drink plenty of fluids.  Prune juice may be helpful.  You may use a stool softener, such as Colace (over the counter) 100 mg twice a day.  Use MiraLax (over the counter) for constipation as needed.     Diet - low sodium heart healthy    Complete by:  As directed      Diet - low sodium heart healthy    Complete by:  As directed      Discharge instructions    Complete by:   As directed   Increase your activities as comfort allows. Expect hip and knee pain and swelling; ice or heat as needed. Do take an over the counter stool softener as needed daily. You can get your actual dressing wet daily in the shower. If needed, you can change your dressing once prior to your outpatient follow-up.     Discharge patient    Complete by:  As directed      Increase activity slowly as tolerated    Complete by:  As directed      Increase activity slowly as tolerated    Complete by:  As directed            Follow-up Information    Follow up with Kathryne Hitch, MD In 2 weeks.   Specialty:  Orthopedic Surgery   Contact information:   [address] [place] [address] [phone]       Follow up with [facility].   Why:  Home Health Physical Therapy and RN   Contact information:   [address] [facility]  [place] [address] [phone]        Signed: Kathryne Hitch 11/09/2014, 7:33 AM

## 2015-02-24 ENCOUNTER — Other Ambulatory Visit: Payer: Self-pay | Admitting: Neurosurgery

## 2015-02-24 DIAGNOSIS — M48061 Spinal stenosis, lumbar region without neurogenic claudication: Secondary | ICD-10-CM

## 2015-02-24 DIAGNOSIS — M542 Cervicalgia: Secondary | ICD-10-CM

## 2015-02-28 ENCOUNTER — Other Ambulatory Visit: Payer: Self-pay | Admitting: Neurosurgery

## 2015-02-28 DIAGNOSIS — M48061 Spinal stenosis, lumbar region without neurogenic claudication: Secondary | ICD-10-CM

## 2015-03-09 ENCOUNTER — Ambulatory Visit
Admission: RE | Admit: 2015-03-09 | Discharge: 2015-03-09 | Disposition: A | Discharge status: Home / Self Care | Payer: No Typology Code available for payment source | Source: Ambulatory Visit | Attending: Neurosurgery | Admitting: Neurosurgery

## 2015-03-09 DIAGNOSIS — M48061 Spinal stenosis, lumbar region without neurogenic claudication: Secondary | ICD-10-CM

## 2015-03-09 DIAGNOSIS — M47816 Spondylosis without myelopathy or radiculopathy, lumbar region: Secondary | ICD-10-CM | POA: Insufficient documentation

## 2015-03-09 DIAGNOSIS — M542 Cervicalgia: Secondary | ICD-10-CM

## 2015-03-09 DIAGNOSIS — M5126 Other intervertebral disc displacement, lumbar region: Secondary | ICD-10-CM | POA: Insufficient documentation

## 2015-03-09 DIAGNOSIS — M4806 Spinal stenosis, lumbar region: Secondary | ICD-10-CM | POA: Diagnosis present

## 2015-03-09 DIAGNOSIS — M4692 Unspecified inflammatory spondylopathy, cervical region: Secondary | ICD-10-CM | POA: Insufficient documentation

## 2015-03-09 DIAGNOSIS — M545 Low back pain: Secondary | ICD-10-CM | POA: Diagnosis present

## 2015-03-31 ENCOUNTER — Other Ambulatory Visit: Payer: Self-pay | Admitting: Family Medicine

## 2015-04-01 ENCOUNTER — Telehealth: Payer: Self-pay

## 2015-04-01 LAB — POCT INR
INR: 2.3
PT: 24.4

## 2015-04-01 LAB — PROTIME-INR
INR: 2.3 — ABNORMAL HIGH (ref 0.8–1.2)
PROTHROMBIN TIME: 24.4 s — AB (ref 9.1–12.0)

## 2015-04-01 NOTE — Telephone Encounter (Signed)
Tried calling pt. Left message for pt to call back.

## 2015-04-01 NOTE — Telephone Encounter (Signed)
Continue 7 mg and recheck in 2 weeks. Please put in order and notify patient.

## 2015-04-01 NOTE — Telephone Encounter (Signed)
Got results thru [employer] telephone call. The Results were INR 2.3 PT 24.4

## 2015-04-02 NOTE — Telephone Encounter (Signed)
Pt returned call. Thanks TNP °

## 2015-04-02 NOTE — Telephone Encounter (Signed)
Incoming call from pt,  Pt verbalized fully understanding of PT/INR results and following up appointment in 2 weeks per MD's request. Transferred phone call for front desk for them to make the appointment.

## 2015-04-15 DIAGNOSIS — I453 Trifascicular block: Secondary | ICD-10-CM | POA: Insufficient documentation

## 2015-04-15 DIAGNOSIS — N841 Polyp of cervix uteri: Secondary | ICD-10-CM | POA: Insufficient documentation

## 2015-04-15 DIAGNOSIS — I4510 Unspecified right bundle-branch block: Secondary | ICD-10-CM | POA: Insufficient documentation

## 2015-04-15 DIAGNOSIS — M791 Myalgia, unspecified site: Secondary | ICD-10-CM | POA: Insufficient documentation

## 2015-04-15 DIAGNOSIS — R748 Abnormal levels of other serum enzymes: Secondary | ICD-10-CM | POA: Insufficient documentation

## 2015-04-15 DIAGNOSIS — G40909 Epilepsy, unspecified, not intractable, without status epilepticus: Secondary | ICD-10-CM | POA: Insufficient documentation

## 2015-04-15 DIAGNOSIS — R609 Edema, unspecified: Secondary | ICD-10-CM | POA: Insufficient documentation

## 2015-04-15 DIAGNOSIS — E669 Obesity, unspecified: Secondary | ICD-10-CM

## 2015-04-15 DIAGNOSIS — E7800 Pure hypercholesterolemia, unspecified: Secondary | ICD-10-CM | POA: Insufficient documentation

## 2015-04-15 DIAGNOSIS — Z87828 Personal history of other (healed) physical injury and trauma: Secondary | ICD-10-CM | POA: Insufficient documentation

## 2015-04-15 DIAGNOSIS — I6789 Other cerebrovascular disease: Secondary | ICD-10-CM

## 2015-04-15 DIAGNOSIS — M25569 Pain in unspecified knee: Secondary | ICD-10-CM | POA: Insufficient documentation

## 2015-04-15 DIAGNOSIS — N3941 Urge incontinence: Secondary | ICD-10-CM | POA: Insufficient documentation

## 2015-04-15 DIAGNOSIS — E559 Vitamin D deficiency, unspecified: Secondary | ICD-10-CM | POA: Insufficient documentation

## 2015-04-15 DIAGNOSIS — I10 Essential (primary) hypertension: Secondary | ICD-10-CM | POA: Insufficient documentation

## 2015-04-15 DIAGNOSIS — I052 Rheumatic mitral stenosis with insufficiency: Secondary | ICD-10-CM | POA: Insufficient documentation

## 2015-04-15 DIAGNOSIS — I480 Paroxysmal atrial fibrillation: Secondary | ICD-10-CM | POA: Insufficient documentation

## 2015-04-15 DIAGNOSIS — M1990 Unspecified osteoarthritis, unspecified site: Secondary | ICD-10-CM | POA: Insufficient documentation

## 2015-04-15 DIAGNOSIS — Z131 Encounter for screening for diabetes mellitus: Secondary | ICD-10-CM | POA: Insufficient documentation

## 2015-04-16 ENCOUNTER — Ambulatory Visit (INDEPENDENT_AMBULATORY_CARE_PROVIDER_SITE_OTHER): Payer: No Typology Code available for payment source

## 2015-04-16 DIAGNOSIS — I052 Rheumatic mitral stenosis with insufficiency: Secondary | ICD-10-CM | POA: Diagnosis not present

## 2015-04-16 LAB — POCT INR
INR: 2.6
PT: 30.6

## 2015-04-16 NOTE — Patient Instructions (Signed)
Continue same dose, Coumadin 7mg  Daily. Come back in 4 weeks. Call if any questions or concerns.

## 2015-04-21 ENCOUNTER — Other Ambulatory Visit: Payer: Self-pay | Admitting: Family Medicine

## 2015-04-21 DIAGNOSIS — I6789 Other cerebrovascular disease: Principal | ICD-10-CM

## 2015-04-21 DIAGNOSIS — G40909 Epilepsy, unspecified, not intractable, without status epilepticus: Secondary | ICD-10-CM

## 2015-04-28 ENCOUNTER — Other Ambulatory Visit: Payer: Self-pay | Admitting: Family Medicine

## 2015-04-28 DIAGNOSIS — I480 Paroxysmal atrial fibrillation: Secondary | ICD-10-CM

## 2015-04-30 ENCOUNTER — Telehealth: Payer: Self-pay | Admitting: Family Medicine

## 2015-04-30 DIAGNOSIS — B002 Herpesviral gingivostomatitis and pharyngotonsillitis: Secondary | ICD-10-CM

## 2015-04-30 MED ORDER — VALACYCLOVIR HCL 1 G PO TABS: 2000.0000 mg | ORAL_TABLET | Freq: Two times a day (BID) | ORAL | Status: DC

## 2015-04-30 NOTE — Telephone Encounter (Signed)
Pt is requesting a Rx to help with a fever blister on the out side of her lip.  Pt states it has been several years since she has had anything called in for this.  [place].  CB#[phone]/[id number]

## 2015-05-10 ENCOUNTER — Other Ambulatory Visit: Payer: Self-pay | Admitting: Family Medicine

## 2015-05-10 DIAGNOSIS — I480 Paroxysmal atrial fibrillation: Secondary | ICD-10-CM

## 2015-05-14 ENCOUNTER — Ambulatory Visit (INDEPENDENT_AMBULATORY_CARE_PROVIDER_SITE_OTHER): Payer: No Typology Code available for payment source

## 2015-05-14 DIAGNOSIS — I052 Rheumatic mitral stenosis with insufficiency: Secondary | ICD-10-CM

## 2015-05-14 LAB — POCT INR
INR: 4
PT: 48.2

## 2015-05-14 NOTE — Patient Instructions (Signed)
INR today was 4.0  Hold Coumadin for 2 days( Today and Tomorrow), and then take 6 mg daily and come back in 2 weeks. Call if any questions or concerns.

## 2015-05-26 ENCOUNTER — Other Ambulatory Visit: Payer: Self-pay

## 2015-05-26 ENCOUNTER — Ambulatory Visit (INDEPENDENT_AMBULATORY_CARE_PROVIDER_SITE_OTHER): Payer: No Typology Code available for payment source

## 2015-05-26 DIAGNOSIS — I052 Rheumatic mitral stenosis with insufficiency: Secondary | ICD-10-CM

## 2015-05-26 DIAGNOSIS — I480 Paroxysmal atrial fibrillation: Secondary | ICD-10-CM

## 2015-05-26 LAB — POCT INR
INR: 2.4
PT: 28.3

## 2015-05-26 MED ORDER — WARFARIN SODIUM 3 MG PO TABS: 6.0000 mg | ORAL_TABLET | Freq: Every day | ORAL | Status: DC

## 2015-05-26 NOTE — Telephone Encounter (Signed)
Pt requesting Coumadin 3 mg medication refill.

## 2015-05-26 NOTE — Patient Instructions (Signed)
PT/INR today INR 2.4  Continue same dose, coumadin 6 mg daily. Come back in 4 weeks. Call if any questions or concerns.

## 2015-06-23 ENCOUNTER — Ambulatory Visit (INDEPENDENT_AMBULATORY_CARE_PROVIDER_SITE_OTHER): Payer: BC Managed Care – PPO

## 2015-06-23 DIAGNOSIS — M4806 Spinal stenosis, lumbar region: Secondary | ICD-10-CM | POA: Diagnosis not present

## 2015-06-23 DIAGNOSIS — M48061 Spinal stenosis, lumbar region without neurogenic claudication: Secondary | ICD-10-CM

## 2015-06-23 LAB — POCT INR: INR: 3.1

## 2015-06-23 NOTE — Patient Instructions (Signed)
Hold Coumadin today and then continue same dose Coumadin 6 mg daily. Come back in 2 weeks. Call if any questions or concerns

## 2015-07-07 ENCOUNTER — Ambulatory Visit (INDEPENDENT_AMBULATORY_CARE_PROVIDER_SITE_OTHER): Payer: BC Managed Care – PPO

## 2015-07-07 DIAGNOSIS — I052 Rheumatic mitral stenosis with insufficiency: Secondary | ICD-10-CM

## 2015-07-07 LAB — POCT INR
INR: 2
PT: 24.5

## 2015-07-07 NOTE — Patient Instructions (Signed)
Cotinue same dose Coumadin 6 mg daily. Come back in 2 weeks. Call if any questions or concerns.

## 2015-07-21 ENCOUNTER — Ambulatory Visit (INDEPENDENT_AMBULATORY_CARE_PROVIDER_SITE_OTHER): Payer: BC Managed Care – PPO

## 2015-07-21 DIAGNOSIS — I052 Rheumatic mitral stenosis with insufficiency: Secondary | ICD-10-CM | POA: Diagnosis not present

## 2015-07-21 LAB — POCT INR
INR: 2.4
PT: 28.6

## 2015-07-21 NOTE — Patient Instructions (Signed)
Cotinue same dose Coumadin 6 mg daily.  Come back in 4 weeks.  Call if any questions or concerns 

## 2015-08-03 ENCOUNTER — Other Ambulatory Visit: Payer: Self-pay

## 2015-08-03 DIAGNOSIS — I6789 Other cerebrovascular disease: Secondary | ICD-10-CM

## 2015-08-03 DIAGNOSIS — I10 Essential (primary) hypertension: Secondary | ICD-10-CM

## 2015-08-03 DIAGNOSIS — G40909 Epilepsy, unspecified, not intractable, without status epilepticus: Secondary | ICD-10-CM

## 2015-08-03 MED ORDER — DILANTIN 100 MG PO CAPS: 400.0000 mg | ORAL_CAPSULE | Freq: Every day | ORAL | Status: DC

## 2015-08-12 ENCOUNTER — Other Ambulatory Visit: Payer: Self-pay

## 2015-08-12 DIAGNOSIS — I10 Essential (primary) hypertension: Secondary | ICD-10-CM

## 2015-08-12 MED ORDER — LOSARTAN POTASSIUM 100 MG PO TABS: 100.0000 mg | ORAL_TABLET | Freq: Every day | ORAL | Status: DC

## 2015-08-17 ENCOUNTER — Other Ambulatory Visit: Payer: Self-pay

## 2015-08-17 DIAGNOSIS — R609 Edema, unspecified: Secondary | ICD-10-CM

## 2015-08-17 MED ORDER — TORSEMIDE 20 MG PO TABS: 20.0000 mg | ORAL_TABLET | Freq: Every day | ORAL | Status: DC

## 2015-08-18 ENCOUNTER — Ambulatory Visit (INDEPENDENT_AMBULATORY_CARE_PROVIDER_SITE_OTHER): Payer: BC Managed Care – PPO

## 2015-08-18 DIAGNOSIS — I052 Rheumatic mitral stenosis with insufficiency: Secondary | ICD-10-CM | POA: Diagnosis not present

## 2015-08-18 LAB — POCT INR
INR: 3
PT: 32

## 2015-08-18 NOTE — Patient Instructions (Signed)
Cotinue same dose Coumadin 6 mg daily.  Come back in 4 weeks.  Call if any questions or concerns

## 2015-08-18 NOTE — Progress Notes (Signed)
Patient ID: Victoria Davila, female   DOB: [birth year], 59 y.o.   MRN: [id number] Anticoagulation Dose Instructions as of 08/18/2015      Victoria Davila Tue Wed Thu Fri Sat   New Dose 6 mg 6 mg 6 mg 6 mg 6 mg 6 mg 6 mg    Description        Cotinue same dose Coumadin 6 mg daily. Come back in 4 weeks. Call if any questions or concerns.

## 2015-09-15 ENCOUNTER — Ambulatory Visit: Payer: BC Managed Care – PPO

## 2015-09-17 ENCOUNTER — Ambulatory Visit (INDEPENDENT_AMBULATORY_CARE_PROVIDER_SITE_OTHER): Payer: BC Managed Care – PPO | Admitting: Emergency Medicine

## 2015-09-17 DIAGNOSIS — I052 Rheumatic mitral stenosis with insufficiency: Secondary | ICD-10-CM | POA: Diagnosis not present

## 2015-09-17 LAB — POCT INR: INR: 2.6

## 2015-10-11 ENCOUNTER — Other Ambulatory Visit: Payer: Self-pay

## 2015-10-11 DIAGNOSIS — E876 Hypokalemia: Secondary | ICD-10-CM | POA: Insufficient documentation

## 2015-10-11 MED ORDER — POTASSIUM CHLORIDE CRYS ER 10 MEQ PO TBCR: 10.0000 meq | EXTENDED_RELEASE_TABLET | Freq: Every day | ORAL | Status: DC

## 2015-10-15 ENCOUNTER — Ambulatory Visit (INDEPENDENT_AMBULATORY_CARE_PROVIDER_SITE_OTHER): Payer: BC Managed Care – PPO

## 2015-10-15 DIAGNOSIS — I052 Rheumatic mitral stenosis with insufficiency: Secondary | ICD-10-CM

## 2015-10-15 LAB — POCT INR
INR: 2.3
PT: 28

## 2015-10-15 NOTE — Patient Instructions (Signed)
Anticoagulation Dose Instructions as of 10/15/2015      Glynis Smiles Tue Wed Thu Fri Sat   New Dose 6 mg 6 mg 6 mg 6 mg 6 mg 6 mg 6 mg    Description        Cotinue same dose Coumadin 6 mg daily. Come back in 4 weeks. Call if any questions or concerns.

## 2015-11-09 ENCOUNTER — Other Ambulatory Visit: Payer: Self-pay | Admitting: Family Medicine

## 2015-11-17 ENCOUNTER — Ambulatory Visit (INDEPENDENT_AMBULATORY_CARE_PROVIDER_SITE_OTHER): Payer: BC Managed Care – PPO

## 2015-11-17 DIAGNOSIS — I052 Rheumatic mitral stenosis with insufficiency: Secondary | ICD-10-CM | POA: Diagnosis not present

## 2015-11-17 LAB — POCT INR
INR: 2.7
PT: 32

## 2015-11-17 NOTE — Patient Instructions (Signed)
Anticoagulation Dose Instructions as of 11/17/2015      Victoria Davila Tue Wed Thu Fri Sat   New Dose 6 mg 6 mg 6 mg 6 mg 6 mg 6 mg 6 mg    Description        Cotinue same dose Coumadin 6 mg daily. Come back in 4 weeks. Call if any questions or concerns.

## 2015-12-15 ENCOUNTER — Ambulatory Visit (INDEPENDENT_AMBULATORY_CARE_PROVIDER_SITE_OTHER): Payer: BC Managed Care – PPO

## 2015-12-15 DIAGNOSIS — I052 Rheumatic mitral stenosis with insufficiency: Secondary | ICD-10-CM | POA: Diagnosis not present

## 2015-12-15 LAB — POCT INR
INR: 3.2
PT: 38.2

## 2015-12-15 NOTE — Patient Instructions (Signed)
Anticoagulation Dose Instructions as of 12/15/2015      Victoria Davila Tue Wed Thu Fri Sat   New Dose 6 mg 6 mg 6 mg 6 mg 6 mg 6 mg 6 mg    Description        Hold 1 day then resume dose of Coumadin 6 mg daily. F/U 1-2 weeks with Dr. Elease Hashimoto

## 2015-12-29 ENCOUNTER — Ambulatory Visit (INDEPENDENT_AMBULATORY_CARE_PROVIDER_SITE_OTHER): Payer: BC Managed Care – PPO | Admitting: Family Medicine

## 2015-12-29 ENCOUNTER — Encounter: Payer: Self-pay | Admitting: Family Medicine

## 2015-12-29 DIAGNOSIS — E7800 Pure hypercholesterolemia, unspecified: Secondary | ICD-10-CM | POA: Diagnosis not present

## 2015-12-29 DIAGNOSIS — I480 Paroxysmal atrial fibrillation: Secondary | ICD-10-CM | POA: Diagnosis not present

## 2015-12-29 DIAGNOSIS — I10 Essential (primary) hypertension: Secondary | ICD-10-CM | POA: Diagnosis not present

## 2015-12-29 DIAGNOSIS — I052 Rheumatic mitral stenosis with insufficiency: Secondary | ICD-10-CM

## 2015-12-29 LAB — POCT INR
INR: 2
PT: 24

## 2015-12-29 NOTE — Progress Notes (Signed)
Patient ID: [name], female   DOB: 1955-12-26, 60 y.o.   MRN: [id number]        Patient: Victoria Davila Female    DOB: 1956-07-30   60 y.o.   MRN: [id number] Visit Date: 12/29/2015  Today's Provider: Lorie Phenix, MD   No chief complaint on file.  Subjective:    HPI    Hypertension, follow-up:  BP Readings from Last 3 Encounters:  12/23/14 108/72  12/23/14 108/72  11/09/14 126/59    She was last seen for hypertension 1 years ago.  Management since that visit includes None .She reports excellent compliance with treatment. She is not having side effects.  She is adherent to low salt diet.   She is experiencing none.  Patient denies chest pain, chest pressure/discomfort, palpitations, syncope and tachypnea.   Cardiovascular risk factors include hypertension and obesity (BMI >= 30 kg/m2).  ------------------------------------------------------------------------    Lipid/Cholesterol, Follow-up:   Last seen for this 1 years ago.  Management since that visit includes None.  Last Lipid Panel: No results found for: CHOL, TRIG, HDL, CHOLHDL, VLDL, LDLCALC, LDLDIRECT   Wt Readings from Last 3 Encounters:  12/23/14 248 lb (112.492 kg)  12/23/14 248 lb (112.492 kg)  03/09/15 250 lb (113.399 kg)    ------------------------------------------------------------------------       No Known Allergies Previous Medications   ACETAMINOPHEN (TYLENOL) 500 MG TABLET    Take 1,000 mg by mouth every 6 (six) hours as needed for mild pain or moderate pain.   CALCIUM CARBONATE-VITAMIN D (CALTRATE 600+D PO)    Take 1 tablet by mouth 2 (two) times daily.   DILANTIN 100 MG ER CAPSULE    TAKE 4 CAPSULES BY MOUTH DAILY   LOSARTAN (COZAAR) 100 MG TABLET    Take 1 tablet (100 mg total) by mouth daily.   METOPROLOL TARTRATE (LOPRESSOR) 25 MG TABLET    Take by mouth.   MULTIPLE VITAMIN (MULTIVITAMIN WITH MINERALS) TABS TABLET    Take 1 tablet by mouth at bedtime.   OMEGA-3  FATTY ACIDS (FISH OIL) 1200 MG CAPS    Take by mouth.   POTASSIUM CHLORIDE (K-DUR,KLOR-CON) 10 MEQ TABLET    Take 1 tablet (10 mEq total) by mouth at bedtime.   PSYLLIUM (REGULOID) 0.52 G CAPSULE    Take 0.52 g by mouth 2 (two) times daily.   TORSEMIDE (DEMADEX) 20 MG TABLET    Take 1 tablet (20 mg total) by mouth at bedtime.   VALACYCLOVIR (VALTREX) 1000 MG TABLET    Take 2 tablets (2,000 mg total) by mouth 2 (two) times daily. For one day.   WARFARIN (COUMADIN) 3 MG TABLET    Take 2 tablets (6 mg total) by mouth daily.   WARFARIN (COUMADIN) 4 MG TABLET    TAKE 1 TABLET EVERY DAY WITH A 3MG  TABLET    Review of Systems  Constitutional: Negative.   Respiratory: Negative.   Cardiovascular: Positive for leg swelling. Negative for chest pain and palpitations.  Gastrointestinal: Negative.   Musculoskeletal: Positive for back pain and arthralgias. Negative for myalgias, joint swelling, gait problem, neck pain and neck stiffness.  Neurological: Negative for dizziness, light-headedness and headaches.    Social History  Substance Use Topics  . Smoking status: Never Smoker   . Smokeless tobacco: Not on file  . Alcohol Use: Yes     Comment: OCCASIONAL WINE ONLY   Objective:   There were no vitals taken for this visit.  Physical Exam  Constitutional: She  is oriented to person, place, and time. She appears well-developed and well-nourished.  Cardiovascular: Normal rate, regular rhythm and normal heart sounds.   Pulmonary/Chest: Effort normal and breath sounds normal.  Neurological: She is alert and oriented to person, place, and time.  Psychiatric: She has a normal mood and affect. Her behavior is normal. Judgment and thought content normal.        Assessment & Plan:     1. AF (paroxysmal atrial fibrillation) (HCC) Stable. Recheck PT in 4 weeks. Follow up for CPE in May.  Labs before CPE. Also will call for neurology referral to check on medication secondary to history of seizures.  Well  controlled on Dilantin currently.   2. Essential (primary) hypertension Condition is stable. Please continue current medication and  plan of care as noted.    3. Hypercholesteremia Recheck after tax season is over. Patient will call for lab slip.   4. Mitral regurgitation and mitral stenosis Stable. Recheck in 4 weeks.  - POCT INR   Patient was seen and examined by Leo Grosser, MD, and note scribed by Kavin Leech, CMA.   I have reviewed the document for accuracy and completeness and I agree with above. - Leo Grosser, MD      Lorie Phenix, MD  [facility]

## 2016-01-26 ENCOUNTER — Ambulatory Visit (INDEPENDENT_AMBULATORY_CARE_PROVIDER_SITE_OTHER): Payer: BC Managed Care – PPO

## 2016-01-26 DIAGNOSIS — I052 Rheumatic mitral stenosis with insufficiency: Secondary | ICD-10-CM | POA: Diagnosis not present

## 2016-01-26 LAB — POCT INR
INR: 3.2
PT: 38.2

## 2016-01-26 NOTE — Patient Instructions (Signed)
Anticoagulation Dose Instructions as of 01/26/2016      Victoria Davila Tue Wed Thu Fri Sat   New Dose 6 mg 6 mg 6 mg 6 mg 6 mg 6 mg 6 mg    Description        Hold 2 day then resume dose of Coumadin 6 mg daily. F/U 3 weeks

## 2016-02-09 ENCOUNTER — Other Ambulatory Visit: Payer: Self-pay | Admitting: Family Medicine

## 2016-02-09 DIAGNOSIS — R609 Edema, unspecified: Secondary | ICD-10-CM

## 2016-02-18 ENCOUNTER — Ambulatory Visit (INDEPENDENT_AMBULATORY_CARE_PROVIDER_SITE_OTHER): Payer: BC Managed Care – PPO

## 2016-02-18 DIAGNOSIS — I052 Rheumatic mitral stenosis with insufficiency: Secondary | ICD-10-CM

## 2016-02-18 LAB — POCT INR
INR: 2.7
PT: 33

## 2016-02-18 NOTE — Patient Instructions (Signed)
Anticoagulation Dose Instructions as of 02/18/2016      Victoria Davila Tue Wed Thu Fri Sat   New Dose 6 mg 6 mg 6 mg 6 mg 6 mg 6 mg 6 mg    Description        Coumadin 6 mg daily. F/U 4 weeks

## 2016-02-23 ENCOUNTER — Telehealth: Payer: Self-pay | Admitting: Family Medicine

## 2016-02-23 NOTE — Telephone Encounter (Signed)
Pt needs lab order to be picked up tomorrow before her appt for her  Physical next Tuesday.  Thanks, [employer]

## 2016-02-23 NOTE — Telephone Encounter (Signed)
Will order labs at visit. Sometimes add labs at visit. Thanks.

## 2016-02-24 NOTE — Telephone Encounter (Signed)
Advised patient

## 2016-02-29 ENCOUNTER — Encounter: Payer: Self-pay | Admitting: Family Medicine

## 2016-02-29 ENCOUNTER — Ambulatory Visit (INDEPENDENT_AMBULATORY_CARE_PROVIDER_SITE_OTHER): Payer: BC Managed Care – PPO | Admitting: Family Medicine

## 2016-02-29 VITALS — BP 120/80 | HR 72 | Temp 98.3°F | Resp 16 | Ht 64.0 in | Wt 260.0 lb

## 2016-02-29 DIAGNOSIS — M545 Low back pain: Secondary | ICD-10-CM | POA: Diagnosis not present

## 2016-02-29 DIAGNOSIS — E7800 Pure hypercholesterolemia, unspecified: Secondary | ICD-10-CM

## 2016-02-29 DIAGNOSIS — I6789 Other cerebrovascular disease: Secondary | ICD-10-CM

## 2016-02-29 DIAGNOSIS — G40909 Epilepsy, unspecified, not intractable, without status epilepticus: Secondary | ICD-10-CM

## 2016-02-29 DIAGNOSIS — I10 Essential (primary) hypertension: Secondary | ICD-10-CM | POA: Diagnosis not present

## 2016-02-29 DIAGNOSIS — R238 Other skin changes: Secondary | ICD-10-CM

## 2016-02-29 DIAGNOSIS — E669 Obesity, unspecified: Secondary | ICD-10-CM

## 2016-02-29 DIAGNOSIS — Z0000 Encounter for general adult medical examination without abnormal findings: Secondary | ICD-10-CM

## 2016-02-29 DIAGNOSIS — R748 Abnormal levels of other serum enzymes: Secondary | ICD-10-CM | POA: Diagnosis not present

## 2016-02-29 DIAGNOSIS — E559 Vitamin D deficiency, unspecified: Secondary | ICD-10-CM

## 2016-02-29 DIAGNOSIS — Z1239 Encounter for other screening for malignant neoplasm of breast: Secondary | ICD-10-CM

## 2016-02-29 LAB — POCT URINALYSIS DIPSTICK
Bilirubin, UA: NEGATIVE
Glucose, UA: NEGATIVE
KETONES UA: NEGATIVE
Leukocytes, UA: NEGATIVE
Nitrite, UA: NEGATIVE
PH UA: 5
Protein, UA: NEGATIVE
RBC UA: NEGATIVE
SPEC GRAV UA: 1.015
UROBILINOGEN UA: 0.2

## 2016-02-29 MED ORDER — LIDOCAINE 5 % EX PTCH
1.0000 | MEDICATED_PATCH | CUTANEOUS | Status: DC
Start: 2016-02-29 — End: 2016-10-10

## 2016-02-29 NOTE — Progress Notes (Signed)
Patient ID: Victoria Davila, female   DOB: 1956-02-13, 60 y.o.   MRN: [id number]       Patient: Victoria Davila, Female    DOB: 1956-03-16, 60 y.o.   MRN: [id number] Visit Date: 02/29/2016  Today's Provider: Lorie Phenix, MD   Chief Complaint  Patient presents with  . Annual Exam   Subjective:    Annual physical exam Victoria Davila is a 60 y.o. female who presents today for health maintenance and complete physical. She feels well. She reports exercising daily. She reports she is sleeping well.  04/02/12 CPE 04/02/12 Pap-neg 02/20/11 Mammogram-BI-RADS 1 05/15/11 Colonoscopy-normal -----------------------------------------------------------------  Back pain: Patient reports that she has been having back problems for a few years. Patient reports pain is worsening. Patient reports she has seen orthopedic and has also seen by PT. Patient is requesting handicap form to be filled out.  Review of Systems  Constitutional: Negative.   HENT: Negative.   Eyes: Negative.   Respiratory: Positive for shortness of breath.   Cardiovascular: Positive for leg swelling.  Gastrointestinal: Negative.   Endocrine: Negative.   Genitourinary: Negative.   Musculoskeletal: Positive for back pain, arthralgias and neck pain.  Skin: Negative.   Allergic/Immunologic: Negative.   Neurological: Negative.   Hematological: Negative.   Psychiatric/Behavioral: Negative.     Social History      She  reports that she has never smoked. She has never used smokeless tobacco. She reports that she does not drink alcohol or use illicit drugs.       Social History   Social History  . Marital Status: Married    Spouse Name: N/A  . Number of Children: N/A  . Years of Education: N/A   Social History Main Topics  . Smoking status: Never Smoker   . Smokeless tobacco: Never Used  . Alcohol Use: No     Comment: OCCASIONAL WINE ONLY  . Drug Use: No  . Sexual Activity: Not Asked   Other Topics  Concern  . None   Social History Narrative    Past Medical History  Diagnosis Date  . Hypertension   . Mitral valve disorder     "deformed"  . Dysrhythmia     A FIB / FOLLOWED BY DR Nolon Rod AT [facility]  . Shortness of breath dyspnea     WITH EXERTION  . Seizures (HCC)     LAST SEIZURE JAN 1995  . Neuromuscular disorder (HCC)   . Rheumatoid arthritis (HCC)     RECENT DX - HAS NOT YET SEEN RHEUMATOLOGIST  . Edema of both legs     CHRONIC  . Hyperlipidemia      Patient Active Problem List   Diagnosis Date Noted  . Recurrent oral herpes simplex 04/30/2015  . Arthritis 04/15/2015  . Gonalgia 04/15/2015  . Cervical polyp 04/15/2015  . Encounter for screening for diabetes mellitus 04/15/2015  . Edema 04/15/2015  . Abnormal liver enzymes 04/15/2015  . Essential (primary) hypertension 04/15/2015  . H/O head injury 04/15/2015  . Hypercholesteremia 04/15/2015  . Mitral regurgitation and mitral stenosis 04/15/2015  . Adiposity 04/15/2015  . AF (paroxysmal atrial fibrillation) (HCC) 04/15/2015  . Bundle branch block, right 04/15/2015  . Cerebral seizure (HCC) 04/15/2015  . 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 Urge incontinence 04/15/2015  . Avitaminosis D 04/15/2015  . Arthritis of right hip 11/06/2014  . 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 Breath shortness 07/28/2014    Past Surgical History  Procedure Laterality  Date  . Knee arthroscopy  2012    left  . Abdominal hysterectomy  2012  . Cardiac valve surgery  2001    "heart valve stretched" for mitral valve stenosis  . Total hip arthroplasty Left 11/06/2014    Procedure: LEFT TOTAL HIP ARTHROPLASTY ANTERIOR APPROACH;  Surgeon: Kathryne Hitch, MD;  Location: [facility];  Service: Orthopedics;  Laterality: Left;  . Cervical polypectomy    . Cardiac electrophysiology mapping and ablation      Family History        Family Status  Relation Status Death Age  . Mother Deceased 6    A  MI,  ERSD/Dialysis  . Father Deceased 70    arrhythmia  . Sister Alive   . Paternal Optometrist   . Maternal Grandfather Deceased 40's    MI  . Paternal Grandmother Deceased     Natural Causes  . Sister Alive         Her family history includes Arthritis in her father; Cancer in her sister; Dementia in her father; Diabetes in her mother; Hip fracture in her mother; Lung disease in her paternal grandfather.    No Known Allergies  Previous Medications   ACETAMINOPHEN (TYLENOL) 500 MG TABLET    Take 1,000 mg by mouth every 6 (six) hours as needed for mild pain or moderate pain.   CALCIUM CARBONATE-VITAMIN D (CALTRATE 600+D PO)    Take 1 tablet by mouth 2 (two) times daily.   DILANTIN 100 MG ER CAPSULE    TAKE 4 CAPSULES BY MOUTH DAILY   LOSARTAN (COZAAR) 100 MG TABLET    Take 1 tablet (100 mg total) by mouth daily.   METOPROLOL TARTRATE (LOPRESSOR) 25 MG TABLET    Take 25 mg by mouth 2 (two) times daily.    MULTIPLE VITAMIN (MULTIVITAMIN WITH MINERALS) TABS TABLET    Take 1 tablet by mouth at bedtime.   OMEGA-3 FATTY ACIDS (FISH OIL) 1200 MG CAPS    Take 1 capsule by mouth 2 (two) times daily.    POTASSIUM CHLORIDE (K-DUR,KLOR-CON) 10 MEQ TABLET    Take 1 tablet (10 mEq total) by mouth at bedtime.   PSYLLIUM (REGULOID) 0.52 G CAPSULE    Take 0.52 g by mouth 2 (two) times daily.   TORSEMIDE (DEMADEX) 20 MG TABLET    TAKE 1 TABLET EVERY DAY   VALACYCLOVIR (VALTREX) 1000 MG TABLET    Take 2 tablets (2,000 mg total) by mouth 2 (two) times daily. For one day.   WARFARIN (COUMADIN) 3 MG TABLET    Take 2 tablets (6 mg total) by mouth daily.   WARFARIN (COUMADIN) 4 MG TABLET    TAKE 1 TABLET EVERY DAY WITH A 3MG  TABLET    Patient Care Team: , MD as PCP - General (Family Medicine)     Objective:   Vitals: BP 120/80 mmHg  Pulse 72  Temp(Src) 98.3 F (36.8 C) (Oral)  Resp 16  Ht 5\' 4"  (1.626 m)  Wt 260 lb (117.935 kg)  BMI 44.61 kg/m2   Physical Exam  Constitutional:  She is oriented to person, place, and time. She appears well-developed and well-nourished.  HENT:  Head: Normocephalic and atraumatic.  Right Ear: Tympanic membrane, external ear and ear canal normal.  Left Ear: Tympanic membrane, external ear and ear canal normal.  Nose: Nose normal.  Mouth/Throat: Uvula is midline, oropharynx is clear and moist and mucous membranes are normal.  Eyes: Conjunctivae, EOM and lids are  normal. Pupils are equal, round, and reactive to light.  Neck: Trachea normal and normal range of motion. Neck supple. Carotid bruit is not present. No thyroid mass and no thyromegaly present.  Cardiovascular: Normal rate, regular rhythm and normal heart sounds.   Pulmonary/Chest: Effort normal and breath sounds normal.  Abdominal: Soft. Normal appearance and bowel sounds are normal. There is no hepatosplenomegaly. There is no tenderness.  Genitourinary: No breast swelling, tenderness or discharge.  Musculoskeletal: Normal range of motion.  Lymphadenopathy:    She has no cervical adenopathy.    She has no axillary adenopathy.  Neurological: She is alert and oriented to person, place, and time. She has normal strength. No cranial nerve deficit.  Skin: Skin is warm, dry and intact.  Psychiatric: She has a normal mood and affect. Her speech is normal and behavior is normal. Judgment and thought content normal. Cognition and memory are normal.     Depression Screen PHQ 2/9 Scores 02/29/2016  PHQ - 2 Score 0      Assessment & Plan:     Routine Health Maintenance and Physical Exam  Exercise Activities and Dietary recommendations Goals    None      Immunization History  Administered Date(s) Administered  . Tdap 11/02/2010      1. Annual physical exam Stable. Patient advised to continue eating healthy and exercise daily. - POCT urinalysis dipstick  2. Low back pain, unspecified back pain laterality, with sciatica presence unspecified Chronic. Recurrent. Wrote rx   For patch.  Really encouraged patient to move despite pain.   May help in the long run. Tried to talk about wiring related to persistent pain.   Recheck with Dr. Sullivan Lone in 3 months.   Has seen orthopedics and rheumatology in the past.   - lidocaine (LIDODERM) 5 %; Place 1 patch onto the skin daily. Remove & Discard patch within 12 hours or as directed by MD  Dispense: 30 patch; Refill: 0  3. Breast cancer screening - MM DIGITAL SCREENING BILATERAL; Future  4. Essential (primary) hypertension - CBC with Differential/Platelet - Hemoglobin A1c  5. Abnormal liver enzymes - Comprehensive metabolic panel  6. Avitaminosis D - VITAMIN D 25 Hydroxy (Vit-D Deficiency, Fractures)  7. Hypercholesteremia - Lipid Panel With LDL/HDL Ratio - TSH  8. Cerebral seizure (HCC) - Dilantin (Phenytoin) level, total  9. Adiposity - Hemoglobin A1c  10. Other skin changes - Ambulatory referral to Dermatology   Patient seen and examined by Dr. Leo Grosser, and note scribed by Liz Beach. Dimas, CMA.  I have reviewed the document for accuracy and completeness and I agree with above. Leo Grosser, MD   Lorie Phenix, MD   --------------------------------------------------------------------

## 2016-02-29 NOTE — Patient Instructions (Signed)
Please call the [facility] at [facility] to schedule this at [phone]   

## 2016-03-02 LAB — CBC WITH DIFFERENTIAL/PLATELET
Basophils Absolute: 0 10*3/uL (ref 0.0–0.2)
Basos: 1 %
EOS (ABSOLUTE): 0.1 10*3/uL (ref 0.0–0.4)
Eos: 2 %
Hematocrit: 44.6 % (ref 34.0–46.6)
Hemoglobin: 15.5 g/dL (ref 11.1–15.9)
IMMATURE GRANULOCYTES: 0 %
Immature Grans (Abs): 0 10*3/uL (ref 0.0–0.1)
LYMPHS ABS: 1.5 10*3/uL (ref 0.7–3.1)
Lymphs: 38 %
MCH: 30.9 pg (ref 26.6–33.0)
MCHC: 34.8 g/dL (ref 31.5–35.7)
MCV: 89 fL (ref 79–97)
MONOS ABS: 0.4 10*3/uL (ref 0.1–0.9)
Monocytes: 9 %
NEUTROS PCT: 50 %
Neutrophils Absolute: 2 10*3/uL (ref 1.4–7.0)
PLATELETS: 183 10*3/uL (ref 150–379)
RBC: 5.01 x10E6/uL (ref 3.77–5.28)
RDW: 15.1 % (ref 12.3–15.4)
WBC: 4 10*3/uL (ref 3.4–10.8)

## 2016-03-02 LAB — COMPREHENSIVE METABOLIC PANEL
A/G RATIO: 1.5 (ref 1.2–2.2)
ALK PHOS: 133 IU/L — AB (ref 39–117)
ALT: 31 IU/L (ref 0–32)
AST: 27 IU/L (ref 0–40)
Albumin: 4.5 g/dL (ref 3.6–4.8)
BUN/Creatinine Ratio: 19 (ref 12–28)
BUN: 18 mg/dL (ref 8–27)
Bilirubin Total: 0.4 mg/dL (ref 0.0–1.2)
CALCIUM: 9.2 mg/dL (ref 8.7–10.3)
CO2: 29 mmol/L (ref 18–29)
CREATININE: 0.93 mg/dL (ref 0.57–1.00)
Chloride: 95 mmol/L — ABNORMAL LOW (ref 96–106)
GFR calc Af Amer: 77 mL/min/{1.73_m2} (ref >59)
GFR, EST NON AFRICAN AMERICAN: 67 mL/min/{1.73_m2} (ref >59)
GLOBULIN, TOTAL: 3 g/dL (ref 1.5–4.5)
Glucose: 97 mg/dL (ref 65–99)
POTASSIUM: 3.7 mmol/L (ref 3.5–5.2)
SODIUM: 142 mmol/L (ref 134–144)
Total Protein: 7.5 g/dL (ref 6.0–8.5)

## 2016-03-02 LAB — TSH: TSH: 2.59 u[IU]/mL (ref 0.450–4.500)

## 2016-03-02 LAB — LIPID PANEL WITH LDL/HDL RATIO
CHOLESTEROL TOTAL: 287 mg/dL — AB (ref 100–199)
HDL: 68 mg/dL (ref >39)
LDL Calculated: 183 mg/dL — ABNORMAL HIGH (ref 0–99)
LDL/HDL RATIO: 2.7 ratio (ref 0.0–3.2)
TRIGLYCERIDES: 178 mg/dL — AB (ref 0–149)
VLDL Cholesterol Cal: 36 mg/dL (ref 5–40)

## 2016-03-02 LAB — HEMOGLOBIN A1C
ESTIMATED AVERAGE GLUCOSE: 114 mg/dL
Hgb A1c MFr Bld: 5.6 % (ref 4.8–5.6)

## 2016-03-02 LAB — PHENYTOIN LEVEL, TOTAL: PHENYTOIN (DILANTIN), SERUM: 11.7 ug/mL (ref 10.0–20.0)

## 2016-03-02 LAB — VITAMIN D 25 HYDROXY (VIT D DEFICIENCY, FRACTURES): Vit D, 25-Hydroxy: 35.7 ng/mL (ref 30.0–100.0)

## 2016-03-17 ENCOUNTER — Ambulatory Visit (INDEPENDENT_AMBULATORY_CARE_PROVIDER_SITE_OTHER): Payer: BC Managed Care – PPO

## 2016-03-17 DIAGNOSIS — I052 Rheumatic mitral stenosis with insufficiency: Secondary | ICD-10-CM

## 2016-03-17 LAB — POCT INR
INR: 2.7
PT: 32.3

## 2016-03-17 NOTE — Patient Instructions (Signed)
Anticoagulation Dose Instructions as of 03/17/2016      Victoria Davila Tue Wed Thu Fri Sat   New Dose 6 mg 6 mg 6 mg 6 mg 6 mg 6 mg 6 mg    Description        Coumadin 6 mg daily. F/U 4 weeks

## 2016-04-01 IMAGING — CR DG CHEST 2V
2 series · 2 of 2 positions shown · non-contrast
Comparison: None.

CLINICAL DATA: Hypertension, preop for total hip surgery.

EXAM:
CHEST  2 VIEW

[w chest pa]
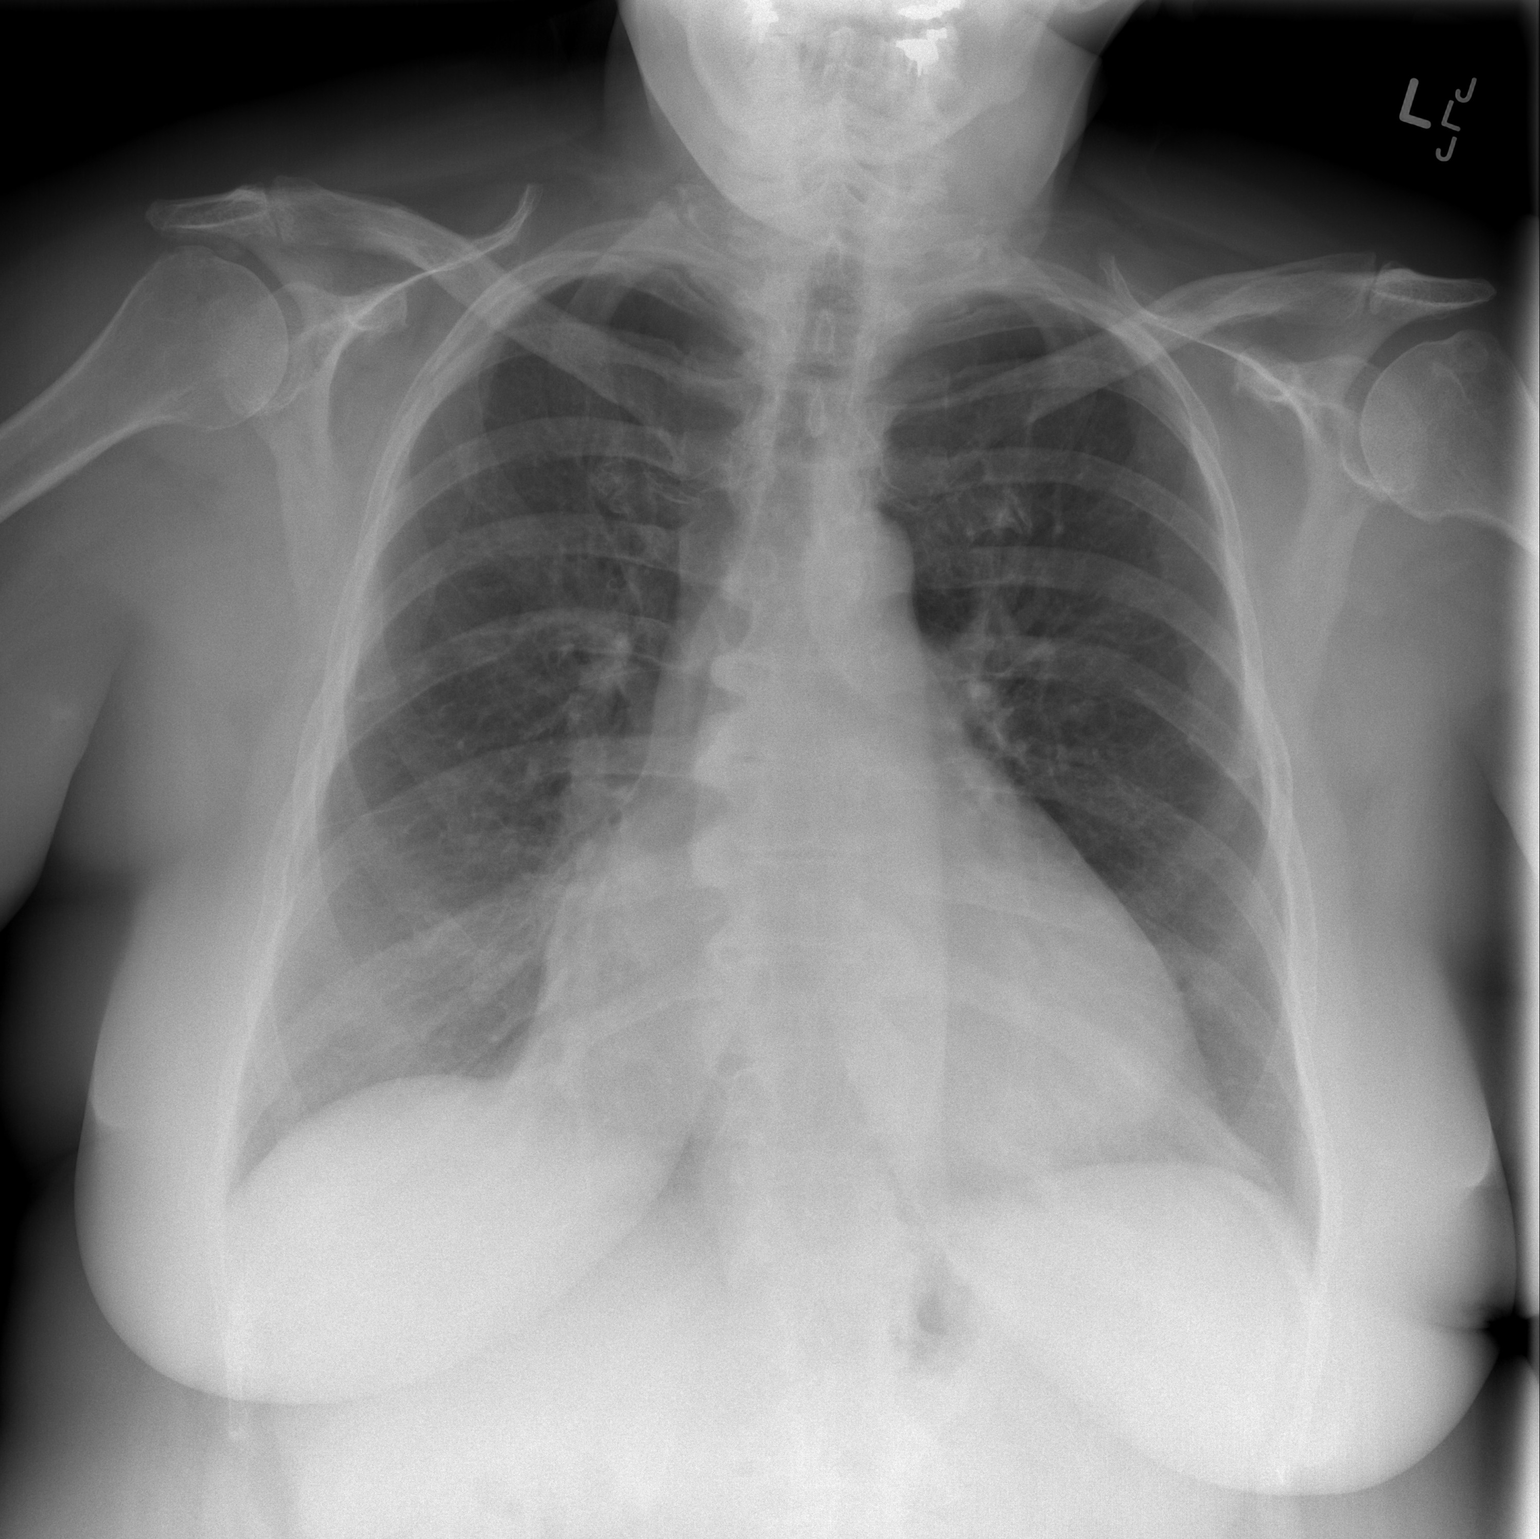

[w chest lat]
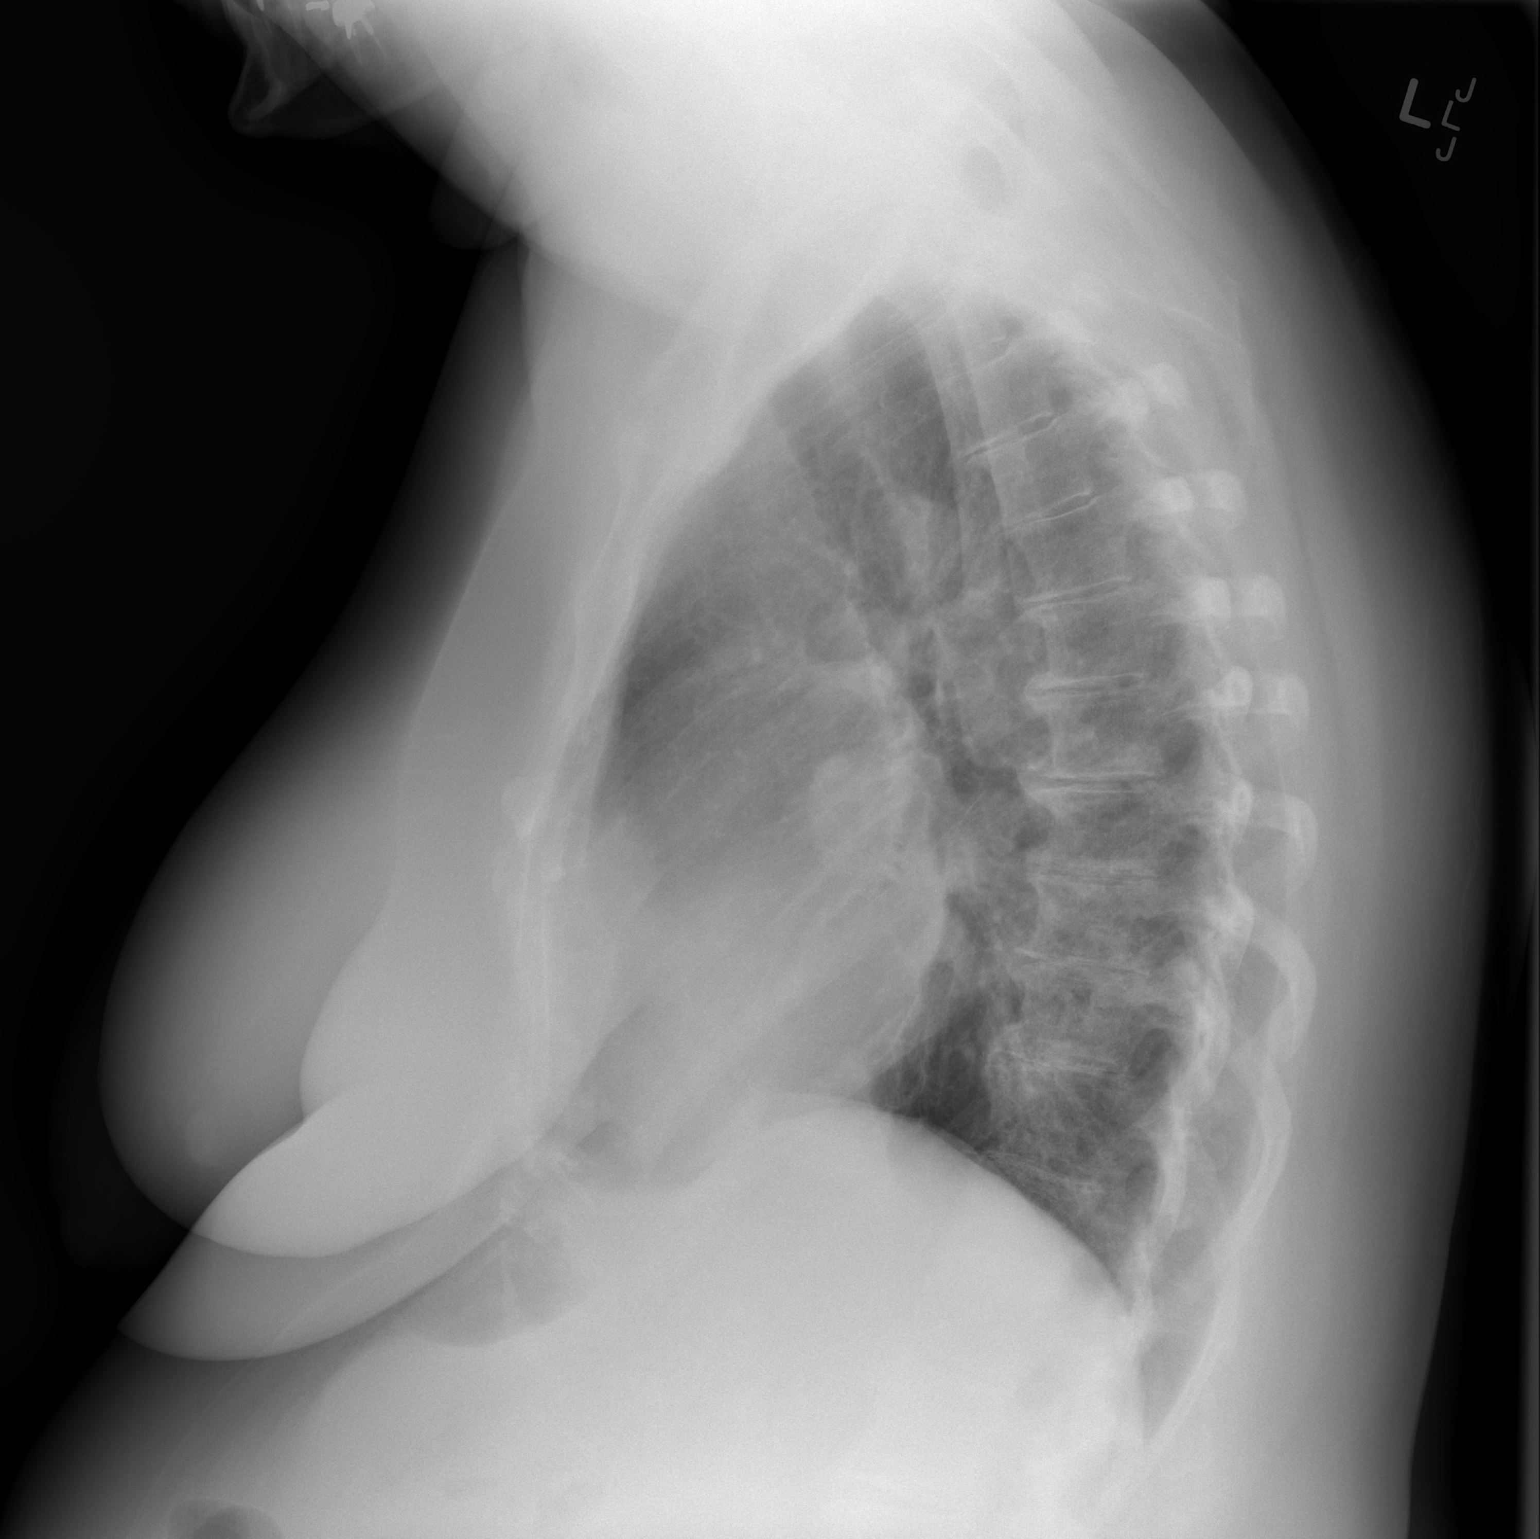

[2 of 2 positions shown; findings below may reference images not displayed]

FINDINGS: Heart is borderline in size. No confluent airspace opacities,
effusions or edema. Degenerative changes in the thoracic spine.
IMPRESSION: Borderline heart size.  No active disease.

## 2016-04-10 ENCOUNTER — Other Ambulatory Visit: Payer: Self-pay | Admitting: Family Medicine

## 2016-04-10 NOTE — Telephone Encounter (Signed)
Labs checked 03/01/2016 and potassium was WNL. Allene Dillon, CMA

## 2016-04-14 ENCOUNTER — Ambulatory Visit (INDEPENDENT_AMBULATORY_CARE_PROVIDER_SITE_OTHER): Payer: BC Managed Care – PPO

## 2016-04-14 DIAGNOSIS — I052 Rheumatic mitral stenosis with insufficiency: Secondary | ICD-10-CM

## 2016-04-14 LAB — POCT INR
INR: 2.9
PT: 35.2

## 2016-04-14 NOTE — Patient Instructions (Signed)
Anticoagulation Dose Instructions as of 04/14/2016      Victoria Davila Tue Wed Thu Fri Sat   New Dose 6 mg 6 mg 6 mg 6 mg 6 mg 6 mg 6 mg    Description        Coumadin 6 mg daily. F/U 4 weeks

## 2016-05-05 ENCOUNTER — Other Ambulatory Visit: Payer: Self-pay | Admitting: Family Medicine

## 2016-05-05 DIAGNOSIS — G40909 Epilepsy, unspecified, not intractable, without status epilepticus: Secondary | ICD-10-CM

## 2016-05-05 DIAGNOSIS — I6789 Other cerebrovascular disease: Secondary | ICD-10-CM

## 2016-05-05 DIAGNOSIS — B002 Herpesviral gingivostomatitis and pharyngotonsillitis: Secondary | ICD-10-CM

## 2016-05-05 NOTE — Telephone Encounter (Signed)
Supposed to FU with you around August 2017. Victoria Davila, CMA

## 2016-05-12 ENCOUNTER — Ambulatory Visit (INDEPENDENT_AMBULATORY_CARE_PROVIDER_SITE_OTHER): Payer: BC Managed Care – PPO

## 2016-05-12 DIAGNOSIS — I050 Rheumatic mitral stenosis: Secondary | ICD-10-CM | POA: Diagnosis not present

## 2016-05-12 LAB — POCT INR
INR: 3.6
PT: 37.5

## 2016-05-12 MED ORDER — WARFARIN SODIUM 6 MG PO TABS: 6.0000 mg | ORAL_TABLET | Freq: Every day | ORAL | Status: DC

## 2016-05-12 NOTE — Patient Instructions (Signed)
Anticoagulation Dose Instructions as of 05/12/2016      Victoria Davila Tue Wed Thu Fri Sat   New Dose 6 mg 6 mg 6 mg 6 mg 6 mg 6 mg 6 mg    Description        Coumadin 6 mg daily. F/U 1 weeks

## 2016-05-19 ENCOUNTER — Ambulatory Visit (INDEPENDENT_AMBULATORY_CARE_PROVIDER_SITE_OTHER): Payer: BC Managed Care – PPO | Admitting: Emergency Medicine

## 2016-05-19 DIAGNOSIS — I050 Rheumatic mitral stenosis: Secondary | ICD-10-CM | POA: Diagnosis not present

## 2016-05-19 LAB — POCT INR
INR: 3
PT: 35.7

## 2016-05-19 NOTE — Patient Instructions (Signed)
Anticoagulation Dose Instructions as of 05/19/2016      Victoria Davila Tue Wed Thu Fri Sat   New Dose 6 mg 6 mg 6 mg 6 mg 6 mg 6 mg 6 mg    Description        Coumadin 6 mg daily.

## 2016-06-09 ENCOUNTER — Ambulatory Visit (INDEPENDENT_AMBULATORY_CARE_PROVIDER_SITE_OTHER): Payer: BC Managed Care – PPO

## 2016-06-09 DIAGNOSIS — I050 Rheumatic mitral stenosis: Secondary | ICD-10-CM | POA: Diagnosis not present

## 2016-06-09 LAB — POCT INR
INR: 3.6
PT: 42.8

## 2016-06-09 NOTE — Patient Instructions (Signed)
Anticoagulation Dose Instructions as of 06/09/2016      Victoria Davila Tue Wed Thu Fri Sat   New Dose 6 mg 6 mg 6 mg 6 mg 6 mg 6 mg 6 mg    Description   Coumadin 6 mg daily.  F/U in 1 week

## 2016-06-16 ENCOUNTER — Ambulatory Visit: Payer: Self-pay

## 2016-06-21 ENCOUNTER — Ambulatory Visit (INDEPENDENT_AMBULATORY_CARE_PROVIDER_SITE_OTHER): Payer: BC Managed Care – PPO

## 2016-06-21 DIAGNOSIS — I10 Essential (primary) hypertension: Secondary | ICD-10-CM | POA: Diagnosis not present

## 2016-06-21 DIAGNOSIS — I050 Rheumatic mitral stenosis: Secondary | ICD-10-CM | POA: Diagnosis not present

## 2016-06-21 DIAGNOSIS — R609 Edema, unspecified: Secondary | ICD-10-CM

## 2016-06-21 LAB — POCT INR
INR: 3.1
PT: 36.8

## 2016-06-21 MED ORDER — LOSARTAN POTASSIUM 100 MG PO TABS: 50.0000 mg | ORAL_TABLET | Freq: Every day | ORAL | 5 refills | Status: DC

## 2016-06-21 MED ORDER — TORSEMIDE 20 MG PO TABS: 20.0000 mg | ORAL_TABLET | Freq: Every day | ORAL | 5 refills | Status: DC

## 2016-06-21 NOTE — Patient Instructions (Signed)
Anticoagulation Dose Instructions as of 06/21/2016      Victoria Davila Tue Wed Thu Fri Sat   New Dose 6 mg 6 mg 6 mg 6 mg 6 mg 6 mg 6 mg    Description   Coumadin 6 mg daily.  F/U in 4 week

## 2016-07-19 ENCOUNTER — Ambulatory Visit: Payer: Self-pay

## 2016-07-21 ENCOUNTER — Ambulatory Visit (INDEPENDENT_AMBULATORY_CARE_PROVIDER_SITE_OTHER): Payer: BC Managed Care – PPO | Admitting: *Deleted

## 2016-07-21 DIAGNOSIS — I050 Rheumatic mitral stenosis: Secondary | ICD-10-CM | POA: Diagnosis not present

## 2016-07-21 LAB — POCT INR
INR: 3.9
PT: 47.2

## 2016-07-21 NOTE — Patient Instructions (Signed)
Hold coumadin dose today and continue with 6 mg daily tomorrow. Return in 3 weeks.

## 2016-07-21 NOTE — Progress Notes (Signed)
Hold coumadin dose today and continue with 6 mg daily tomorrow. Return in 3 weeks. 

## 2016-08-11 ENCOUNTER — Ambulatory Visit (INDEPENDENT_AMBULATORY_CARE_PROVIDER_SITE_OTHER): Payer: BC Managed Care – PPO

## 2016-08-11 DIAGNOSIS — I050 Rheumatic mitral stenosis: Secondary | ICD-10-CM | POA: Diagnosis not present

## 2016-08-11 DIAGNOSIS — Z23 Encounter for immunization: Secondary | ICD-10-CM | POA: Diagnosis not present

## 2016-08-11 LAB — POCT INR
INR: 3.4
PT: 40.5

## 2016-08-11 NOTE — Patient Instructions (Signed)
Anticoagulation Dose Instructions as of 08/11/2016      Victoria Davila Tue Wed Thu Fri Sat   New Dose 6 mg 3 mg 6 mg 6 mg 6 mg 3 mg 6 mg    Description   6 mg every day, except 3 mg on Monday and Friday. Recheck in 3 weeks.

## 2016-08-15 ENCOUNTER — Ambulatory Visit (INDEPENDENT_AMBULATORY_CARE_PROVIDER_SITE_OTHER): Payer: BC Managed Care – PPO | Admitting: Family Medicine

## 2016-08-15 VITALS — BP 140/70 | HR 64 | Temp 98.8°F | Resp 16 | Wt 268.0 lb

## 2016-08-15 DIAGNOSIS — R319 Hematuria, unspecified: Secondary | ICD-10-CM

## 2016-08-15 DIAGNOSIS — N390 Urinary tract infection, site not specified: Secondary | ICD-10-CM | POA: Diagnosis not present

## 2016-08-15 LAB — POCT URINALYSIS DIPSTICK
Bilirubin, UA: NEGATIVE
Glucose, UA: NEGATIVE
KETONES UA: NEGATIVE
NITRITE UA: NEGATIVE
PH UA: 6.5
Spec Grav, UA: 1.015
Urobilinogen, UA: NEGATIVE

## 2016-08-15 MED ORDER — SULFAMETHOXAZOLE-TRIMETHOPRIM 400-80 MG PO TABS: 1.0000 | ORAL_TABLET | Freq: Two times a day (BID) | ORAL | Status: DC

## 2016-08-15 NOTE — Progress Notes (Signed)
Victoria Davila  MRN: [id number] DOB: February 20, 1956  Subjective:  HPI   The patient is a 60 year old female who presents for evaluation of possible UTI.  She states she woke up this morning with symptoms consistent with UTI; burning with urination, cloudy urine, odor, blood and fever of 100 degrees.  She states that this afternoon her symptoms have all improved.  She further stated that if she had not already called she would not have made the appointment based on her symptoms.   She has an appointment with her cardiologist on Thursday and wanted to make sure she did not have any infection that might interfere with that visit.   Patient Active Problem List   Diagnosis Date Noted  . Recurrent oral herpes simplex 04/30/2015  . Arthritis 04/15/2015  . Gonalgia 04/15/2015  . Cervical polyp 04/15/2015  . Encounter for screening for diabetes mellitus 04/15/2015  . Edema 04/15/2015  . Abnormal liver enzymes 04/15/2015  . Essential (primary) hypertension 04/15/2015  . H/O head injury 04/15/2015  . Hypercholesteremia 04/15/2015  . Mitral regurgitation and mitral stenosis 04/15/2015  . Adiposity 04/15/2015  . AF (paroxysmal atrial fibrillation) (HCC) 04/15/2015  . Bundle branch block, right 04/15/2015  . Cerebral seizure 04/15/2015  . 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 Urge incontinence 04/15/2015  . Avitaminosis D 04/15/2015  . Arthritis of right hip 11/06/2014  . 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 Breath shortness 07/28/2014    Past Medical History:  Diagnosis Date  . Dysrhythmia    A FIB / FOLLOWED BY DR Nolon Rod AT [facility]  . Edema of both legs    CHRONIC  . Hyperlipidemia   . Hypertension   . Mitral valve disorder    "deformed"  . Neuromuscular disorder (HCC)   . Rheumatoid arthritis (HCC)    RECENT DX - HAS NOT YET SEEN RHEUMATOLOGIST  . Seizures (HCC)    LAST SEIZURE JAN 1995  . Shortness of breath dyspnea    WITH EXERTION    Social History     Social History  . Marital status: Married    Spouse name: N/A  . Number of children: N/A  . Years of education: N/A   Occupational History  . Not on file.   Social History Main Topics  . Smoking status: Never Smoker  . Smokeless tobacco: Never Used  . Alcohol use No     Comment: OCCASIONAL WINE ONLY  . Drug use: No  . Sexual activity: Not on file   Other Topics Concern  . Not on file   Social History Narrative  . No narrative on file    Outpatient Encounter Prescriptions as of 08/15/2016  Medication Sig Note  . acetaminophen (TYLENOL) 500 MG tablet Take 1,000 mg by mouth every 6 (six) hours as needed for mild pain or moderate pain.   . Calcium Carbonate-Vitamin D (CALTRATE 600+D PO) Take 1 tablet by mouth 2 (two) times daily.   Marland Kitchen DILANTIN 100 MG ER capsule TAKE 4 CAPSULES BY MOUTH DAILY   . lidocaine (LIDODERM) 5 % Place 1 patch onto the skin daily. Remove & Discard patch within 12 hours or as directed by MD   . losartan (COZAAR) 100 MG tablet Take 0.5 tablets (50 mg total) by mouth daily.   . metoprolol tartrate (LOPRESSOR) 25 MG tablet Take 25 mg by mouth 2 (two) times daily.  04/13/2015: Received from: [facility]  . Multiple Vitamin (MULTIVITAMIN WITH MINERALS) TABS tablet  Take 1 tablet by mouth at bedtime.   . Omega-3 Fatty Acids (FISH OIL) 1200 MG CAPS Take 1 capsule by mouth 2 (two) times daily.  04/13/2015: Received from: [facility]  . potassium chloride (K-DUR) 10 MEQ tablet TAKE ONE TABLET AT BEDTIME   . psyllium (REGULOID) 0.52 G capsule Take 0.52 g by mouth 2 (two) times daily.   Marland Kitchen torsemide (DEMADEX) 20 MG tablet Take 1 tablet (20 mg total) by mouth daily. (Patient taking differently: Take 40 mg by mouth daily. )   . valACYclovir (VALTREX) 1000 MG tablet Take 2 tablets (2,000 mg total) by mouth 2 (two) times daily. For one day. (Patient taking differently: Take 2,000 mg by mouth as needed. For one day.)   . warfarin (COUMADIN)  3 MG tablet Take 2 tablets (6 mg total) by mouth daily.   Marland Kitchen warfarin (COUMADIN) 4 MG tablet TAKE 1 TABLET EVERY DAY WITH A 3MG  TABLET   . warfarin (COUMADIN) 6 MG tablet Take 1 tablet (6 mg total) by mouth daily.    No facility-administered encounter medications on file as of 08/15/2016.     No Known Allergies  Review of Systems  Constitutional: Positive for chills and fever. Negative for malaise/fatigue.  Respiratory: Negative for cough, sputum production, shortness of breath and wheezing.   Cardiovascular: Positive for leg swelling (chronic). Negative for chest pain, palpitations and orthopnea.  Gastrointestinal: Negative.   Neurological: Negative.  Negative for dizziness, weakness and headaches.  Endo/Heme/Allergies: Negative.   Psychiatric/Behavioral: Negative.    Objective:  BP 140/70   Pulse 64   Temp 98.8 F (37.1 C) (Oral)   Resp 16   Wt 268 lb (121.6 kg)   BMI 46.00 kg/m   Physical Exam  Constitutional: She is oriented to person, place, and time and well-developed, well-nourished, and in no distress.  HENT:  Head: Normocephalic and atraumatic.  Right Ear: External ear normal.  Left Ear: External ear normal.  Nose: Nose normal.  Mouth/Throat: Oropharynx is clear and moist.  Eyes: Conjunctivae are normal. No scleral icterus.  Cardiovascular: Normal rate, regular rhythm and normal heart sounds.   Pulmonary/Chest: Effort normal.  Abdominal: Soft.  Lymphadenopathy:    She has no cervical adenopathy.  Neurological: She is alert and oriented to person, place, and time.  Skin: Skin is warm and dry.  Psychiatric: Mood, memory, affect and judgment normal.    Assessment and Plan :  UTI Obtain culture and treat with Septra for 3 days. Atrial fibrillation Check PT/INR early Hypertension  I have done the exam and reviewed the chart and it is accurate to the best of my knowledge. 08/17/2016 M.D. [facility] I have done  the exam and reviewed the chart and it is accurate to the best of my knowledge. [facility] M.D. [facility]

## 2016-08-16 ENCOUNTER — Other Ambulatory Visit: Payer: Self-pay

## 2016-08-16 MED ORDER — SULFAMETHOXAZOLE-TRIMETHOPRIM 400-80 MG PO TABS: 1.0000 | ORAL_TABLET | Freq: Two times a day (BID) | ORAL | 0 refills | Status: DC

## 2016-08-18 ENCOUNTER — Ambulatory Visit (INDEPENDENT_AMBULATORY_CARE_PROVIDER_SITE_OTHER): Payer: BC Managed Care – PPO

## 2016-08-18 DIAGNOSIS — I059 Rheumatic mitral valve disease, unspecified: Secondary | ICD-10-CM

## 2016-08-18 DIAGNOSIS — I050 Rheumatic mitral stenosis: Secondary | ICD-10-CM | POA: Diagnosis not present

## 2016-08-18 LAB — URINE CULTURE

## 2016-08-18 LAB — POCT INR
INR: 2.3
PT: 28

## 2016-08-18 NOTE — Patient Instructions (Signed)
Anticoagulation Dose Instructions as of 08/18/2016      Victoria Davila Tue Wed Thu Fri Sat   New Dose 6 mg 3 mg 6 mg 6 mg 6 mg 6 mg 6 mg    Description   6 mg every day, except 3 mg on Monday. Recheck in 2 weeks.

## 2016-08-19 LAB — BASIC METABOLIC PANEL
BUN/Creatinine Ratio: 18 (ref 12–28)
BUN: 17 mg/dL (ref 8–27)
CALCIUM: 9.1 mg/dL (ref 8.7–10.3)
CHLORIDE: 96 mmol/L (ref 96–106)
CO2: 28 mmol/L (ref 18–29)
Creatinine, Ser: 0.94 mg/dL (ref 0.57–1.00)
GFR calc non Af Amer: 66 mL/min/{1.73_m2} (ref >59)
GFR, EST AFRICAN AMERICAN: 76 mL/min/{1.73_m2} (ref >59)
GLUCOSE: 92 mg/dL (ref 65–99)
POTASSIUM: 3.9 mmol/L (ref 3.5–5.2)
Sodium: 142 mmol/L (ref 134–144)

## 2016-08-21 ENCOUNTER — Telehealth: Payer: Self-pay

## 2016-08-21 NOTE — Telephone Encounter (Signed)
-----   Message from Maple Hudson., MD sent at 08/21/2016 10:15 AM EDT ----- Hopefully patient feeling better. Urine sensitive to all antibiotics.

## 2016-08-21 NOTE — Telephone Encounter (Signed)
Pt advised. States she was feeling 100% better after 3 days of abx, so she D/C the abx. Advised pt to call if sx return. Allene Dillon, CMA

## 2016-08-30 ENCOUNTER — Ambulatory Visit: Payer: BC Managed Care – PPO

## 2016-09-01 ENCOUNTER — Ambulatory Visit (INDEPENDENT_AMBULATORY_CARE_PROVIDER_SITE_OTHER): Payer: BC Managed Care – PPO

## 2016-09-01 DIAGNOSIS — I050 Rheumatic mitral stenosis: Secondary | ICD-10-CM | POA: Diagnosis not present

## 2016-09-01 LAB — POCT INR
INR: 3.3
PT: 40.1

## 2016-09-01 NOTE — Patient Instructions (Signed)
Anticoagulation Dose Instructions as of 09/01/2016      Victoria Davila Tue Wed Thu Fri Sat   New Dose 6 mg 3 mg 6 mg 6 mg 6 mg 6 mg 6 mg    Description   Hold 1 day then resume6 mg every day, except 3 mg on Monday. Recheck in 2 weeks.

## 2016-09-15 ENCOUNTER — Ambulatory Visit (INDEPENDENT_AMBULATORY_CARE_PROVIDER_SITE_OTHER): Payer: BC Managed Care – PPO | Admitting: Emergency Medicine

## 2016-09-15 DIAGNOSIS — I050 Rheumatic mitral stenosis: Secondary | ICD-10-CM

## 2016-09-15 LAB — POCT INR
INR: 2.5
PT: 30.1

## 2016-09-15 NOTE — Patient Instructions (Signed)
Anticoagulation Dose Instructions as of 09/15/2016      Victoria Davila Tue Wed Thu Fri Sat   New Dose 6 mg 3 mg 6 mg 6 mg 6 mg 6 mg 6 mg    Description   6 mg every day, except 3 mg on Monday. Recheck in 1 month.

## 2016-10-10 ENCOUNTER — Other Ambulatory Visit: Payer: Self-pay | Admitting: Family Medicine

## 2016-10-10 DIAGNOSIS — M545 Low back pain, unspecified: Secondary | ICD-10-CM

## 2016-10-13 ENCOUNTER — Ambulatory Visit (INDEPENDENT_AMBULATORY_CARE_PROVIDER_SITE_OTHER): Payer: BC Managed Care – PPO

## 2016-10-13 ENCOUNTER — Other Ambulatory Visit: Payer: Self-pay

## 2016-10-13 DIAGNOSIS — M545 Low back pain, unspecified: Secondary | ICD-10-CM | POA: Insufficient documentation

## 2016-10-13 DIAGNOSIS — I6789 Other cerebrovascular disease: Principal | ICD-10-CM

## 2016-10-13 DIAGNOSIS — G8929 Other chronic pain: Secondary | ICD-10-CM | POA: Insufficient documentation

## 2016-10-13 DIAGNOSIS — I050 Rheumatic mitral stenosis: Secondary | ICD-10-CM

## 2016-10-13 DIAGNOSIS — G40909 Epilepsy, unspecified, not intractable, without status epilepticus: Secondary | ICD-10-CM

## 2016-10-13 LAB — POCT INR
INR: 2.4
PT: 29

## 2016-10-13 NOTE — Telephone Encounter (Signed)
Pt requesting refill for Dilantin, brand name as before. Please review-aa

## 2016-10-13 NOTE — Patient Instructions (Signed)
Anticoagulation Dose Instructions as of 10/13/2016      Victoria Davila Tue Wed Thu Fri Sat   New Dose 6 mg 3 mg 6 mg 6 mg 6 mg 6 mg 6 mg    Description   6 mg every day, except 3 mg on Monday. Recheck in 1 month.

## 2016-10-15 MED ORDER — DILANTIN 100 MG PO CAPS: 400.0000 mg | ORAL_CAPSULE | Freq: Every day | ORAL | 5 refills | Status: DC

## 2016-11-10 ENCOUNTER — Ambulatory Visit (INDEPENDENT_AMBULATORY_CARE_PROVIDER_SITE_OTHER): Payer: BC Managed Care – PPO

## 2016-11-10 DIAGNOSIS — I050 Rheumatic mitral stenosis: Secondary | ICD-10-CM | POA: Diagnosis not present

## 2016-11-10 LAB — POCT INR
INR: 2.2
PT: 26.3

## 2016-11-10 NOTE — Patient Instructions (Signed)
Anticoagulation Dose Instructions as of 11/10/2016      Victoria Davila Tue Wed Thu Fri Sat   New Dose 6 mg 3 mg 6 mg 6 mg 6 mg 6 mg 6 mg    Description   6 mg every day, except 3 mg on Monday. Recheck in 1 month.

## 2016-11-28 ENCOUNTER — Telehealth (INDEPENDENT_AMBULATORY_CARE_PROVIDER_SITE_OTHER): Payer: Self-pay | Admitting: Orthopaedic Surgery

## 2016-11-28 NOTE — Telephone Encounter (Signed)
Patient called and stated that she has a dentist appointment on Monday and is needing her antibiotic filled before she can have her surgery. Cb# [id number].  Her pharmacy is [facility] in [place].

## 2016-11-28 NOTE — Telephone Encounter (Signed)
Patient aware pre-med isn't needed

## 2016-12-08 ENCOUNTER — Ambulatory Visit (INDEPENDENT_AMBULATORY_CARE_PROVIDER_SITE_OTHER): Payer: BC Managed Care – PPO | Admitting: Emergency Medicine

## 2016-12-08 DIAGNOSIS — I050 Rheumatic mitral stenosis: Secondary | ICD-10-CM

## 2016-12-08 LAB — POCT INR
INR: 3.8
PT: 45.1

## 2016-12-08 NOTE — Patient Instructions (Signed)
Anticoagulation Dose Instructions as of 12/08/2016      Glynis Smiles Tue Wed Thu Fri Sat   New Dose 6 mg 3 mg 6 mg 6 mg 6 mg 6 mg 6 mg    Description   Hold for 3 days then restart, 6 mg every day, except 3 mg on Monday. Recheck in 3 weeks

## 2016-12-27 ENCOUNTER — Ambulatory Visit (INDEPENDENT_AMBULATORY_CARE_PROVIDER_SITE_OTHER): Payer: BC Managed Care – PPO

## 2016-12-27 DIAGNOSIS — I050 Rheumatic mitral stenosis: Secondary | ICD-10-CM | POA: Diagnosis not present

## 2016-12-27 LAB — POCT INR
INR: 2.8
PT: 33.4

## 2016-12-27 NOTE — Patient Instructions (Signed)
Anticoagulation Dose Instructions as of 12/27/2016      Victoria Davila Tue Wed Thu Fri Sat   New Dose 6 mg 3 mg 6 mg 6 mg 6 mg 6 mg 6 mg    Description   6 mg every day, F/U 4 weeks

## 2017-01-24 ENCOUNTER — Ambulatory Visit (INDEPENDENT_AMBULATORY_CARE_PROVIDER_SITE_OTHER): Payer: BC Managed Care – PPO

## 2017-01-24 DIAGNOSIS — I050 Rheumatic mitral stenosis: Secondary | ICD-10-CM

## 2017-01-24 LAB — POCT INR
INR: 3.8
PT: 46.1

## 2017-01-24 NOTE — Progress Notes (Signed)
Anticoagulation Dose Instructions as of 01/24/2017      Victoria Davila Tue Wed Thu Fri Sat   New Dose 6 mg 6 mg 6 mg 6 mg 6 mg 6 mg 6 mg    Description   Hold for 2 days, then 6 mg every day, F/U 1 week

## 2017-01-31 ENCOUNTER — Ambulatory Visit (INDEPENDENT_AMBULATORY_CARE_PROVIDER_SITE_OTHER): Payer: BC Managed Care – PPO

## 2017-01-31 DIAGNOSIS — I050 Rheumatic mitral stenosis: Secondary | ICD-10-CM

## 2017-01-31 LAB — POCT INR
INR: 2
PT: 23.5

## 2017-01-31 NOTE — Patient Instructions (Signed)
Anticoagulation Dose Instructions as of 01/31/2017      Glynis Smiles Tue Wed Thu Fri Sat   New Dose 6 mg 6 mg 6 mg 9 mg 6 mg 6 mg 6 mg   Alt Week 6 mg 6 mg 6 mg 6 mg 6 mg 6 mg 6 mg    Description   9 mg today, then 6 mg every day, F/U 1 week

## 2017-02-07 ENCOUNTER — Ambulatory Visit (INDEPENDENT_AMBULATORY_CARE_PROVIDER_SITE_OTHER): Payer: BC Managed Care – PPO

## 2017-02-07 DIAGNOSIS — I050 Rheumatic mitral stenosis: Secondary | ICD-10-CM

## 2017-02-07 LAB — POCT INR
INR: 3.5
PT: 42.2

## 2017-02-07 NOTE — Progress Notes (Signed)
Anticoagulation Dose Instructions as of 02/07/2017      Glynis Smiles Tue Wed Thu Fri Sat   New Dose 6 mg 6 mg 6 mg Hold 6 mg 6 mg 6 mg   Alt Week 6 mg 6 mg 6 mg 6 mg 6 mg 6 mg 6 mg    Description   Hold today, then 6 mg every day, F/U 1 week

## 2017-02-16 ENCOUNTER — Ambulatory Visit (INDEPENDENT_AMBULATORY_CARE_PROVIDER_SITE_OTHER): Payer: BC Managed Care – PPO

## 2017-02-16 DIAGNOSIS — I050 Rheumatic mitral stenosis: Secondary | ICD-10-CM | POA: Diagnosis not present

## 2017-02-16 LAB — POCT INR
INR: 3.3
PT: 39.2

## 2017-02-16 NOTE — Patient Instructions (Signed)
Anticoagulation Dose Instructions as of 02/16/2017      Victoria Davila Tue Wed Thu Fri Sat   New Dose 6 mg 3 mg 6 mg 6 mg 6 mg 6 mg 6 mg    Description    6 mg every day except 3 mg on mondays, F/U 2 week

## 2017-02-28 ENCOUNTER — Ambulatory Visit (INDEPENDENT_AMBULATORY_CARE_PROVIDER_SITE_OTHER): Payer: BC Managed Care – PPO

## 2017-02-28 ENCOUNTER — Other Ambulatory Visit: Payer: Self-pay

## 2017-02-28 DIAGNOSIS — B002 Herpesviral gingivostomatitis and pharyngotonsillitis: Secondary | ICD-10-CM

## 2017-02-28 DIAGNOSIS — I050 Rheumatic mitral stenosis: Secondary | ICD-10-CM

## 2017-02-28 LAB — POCT INR
INR: 2.8
PT: 33.4

## 2017-02-28 NOTE — Patient Instructions (Signed)
Continue 6mg  daily except 3mg  on Mondays. Recheck in three weeks.

## 2017-03-01 MED ORDER — VALACYCLOVIR HCL 1 G PO TABS: 1000.0000 mg | ORAL_TABLET | Freq: Two times a day (BID) | ORAL | 5 refills | Status: DC

## 2017-03-01 NOTE — Telephone Encounter (Signed)
Ok to rf times 11. thx

## 2017-03-01 NOTE — Telephone Encounter (Signed)
Okay to refill as passed prescriptions. I am not sure what this is for or how it was written in the past. Thank you

## 2017-03-21 ENCOUNTER — Ambulatory Visit (INDEPENDENT_AMBULATORY_CARE_PROVIDER_SITE_OTHER): Payer: BC Managed Care – PPO

## 2017-03-21 DIAGNOSIS — I050 Rheumatic mitral stenosis: Secondary | ICD-10-CM | POA: Diagnosis not present

## 2017-03-21 LAB — POCT INR
INR: 2
PT: 23.8

## 2017-03-21 NOTE — Patient Instructions (Addendum)
Anticoagulation Warfarin Dose Instructions as of 03/21/2017      Victoria Davila Tue Wed Thu Fri Sat   New Dose 6 mg 6 mg 6 mg 6 mg 6 mg 6 mg 6 mg    Description   Take 6mg  daily. Recheck in 2-3 weeks.

## 2017-04-11 ENCOUNTER — Ambulatory Visit (INDEPENDENT_AMBULATORY_CARE_PROVIDER_SITE_OTHER): Payer: BC Managed Care – PPO

## 2017-04-11 DIAGNOSIS — I050 Rheumatic mitral stenosis: Secondary | ICD-10-CM | POA: Diagnosis not present

## 2017-04-11 DIAGNOSIS — I6789 Other cerebrovascular disease: Secondary | ICD-10-CM

## 2017-04-11 DIAGNOSIS — G40909 Epilepsy, unspecified, not intractable, without status epilepticus: Secondary | ICD-10-CM

## 2017-04-11 LAB — POCT INR
INR: 3.1
PT: 36.9

## 2017-04-11 MED ORDER — POTASSIUM CHLORIDE ER 10 MEQ PO TBCR: 10.0000 meq | EXTENDED_RELEASE_TABLET | Freq: Every day | ORAL | 3 refills | Status: DC

## 2017-04-11 MED ORDER — DILANTIN 100 MG PO CAPS: 400.0000 mg | ORAL_CAPSULE | Freq: Every day | ORAL | 5 refills | Status: DC

## 2017-04-11 NOTE — Patient Instructions (Signed)
Anticoagulation Warfarin Dose Instructions as of 04/11/2017      Victoria Davila Tue Wed Thu Fri Sat   New Dose 6 mg 6 mg 6 mg 6 mg 6 mg 6 mg 6 mg    Description   Hold 1 day, then take 6mg  daily. Recheck in 2 weeks.

## 2017-04-25 ENCOUNTER — Ambulatory Visit (INDEPENDENT_AMBULATORY_CARE_PROVIDER_SITE_OTHER): Payer: BC Managed Care – PPO | Admitting: Family Medicine

## 2017-04-25 ENCOUNTER — Encounter: Payer: Self-pay | Admitting: Family Medicine

## 2017-04-25 VITALS — BP 122/66 | HR 58 | Temp 98.0°F | Resp 16 | Wt 278.0 lb

## 2017-04-25 DIAGNOSIS — E7800 Pure hypercholesterolemia, unspecified: Secondary | ICD-10-CM | POA: Diagnosis not present

## 2017-04-25 DIAGNOSIS — M25542 Pain in joints of left hand: Secondary | ICD-10-CM

## 2017-04-25 DIAGNOSIS — I6789 Other cerebrovascular disease: Secondary | ICD-10-CM | POA: Diagnosis not present

## 2017-04-25 DIAGNOSIS — I050 Rheumatic mitral stenosis: Secondary | ICD-10-CM

## 2017-04-25 DIAGNOSIS — I10 Essential (primary) hypertension: Secondary | ICD-10-CM

## 2017-04-25 DIAGNOSIS — G40909 Epilepsy, unspecified, not intractable, without status epilepticus: Secondary | ICD-10-CM

## 2017-04-25 LAB — POCT INR
INR: 3
PT: 36.5

## 2017-04-25 NOTE — Progress Notes (Signed)
Subjective:  HPI   Pt now has 47 month old granddaughter--Louise.  Hypertension, follow-up:  BP Readings from Last 3 Encounters:  04/25/17 122/66  08/15/16 140/70  02/29/16 120/80    She was last seen for hypertension 6 months ago.  BP at that visit was 122/66. Management since that visit includes none. She reports good compliance with treatment. She is not having side effects.  She is exercising. She is adherent to low salt diet.   Outside blood pressures are not being checked. She is experiencing none.  Patient denies chest pain, chest pressure/discomfort, claudication, dyspnea, exertional chest pressure/discomfort, fatigue, irregular heart beat, lower extremity edema, near-syncope, orthopnea, palpitations, paroxysmal nocturnal dyspnea and syncope.    Wt Readings from Last 3 Encounters:  04/25/17 278 lb (126.1 kg)  08/15/16 268 lb (121.6 kg)  02/29/16 260 lb (117.9 kg)   ------------------------------------------------------------------------ Pt also due for a PT/INR check today. Her INR was 3.0 and PT 36.5. She is taking 6 mg daily. 2 weeks ago she was told to hold for 1 day then continue 6 mg daily because her INR was 3.1   Prior to Admission medications   Medication Sig Start Date End Date Taking? Authorizing Provider  acetaminophen (TYLENOL) 500 MG tablet Take 1,000 mg by mouth every 6 (six) hours as needed for mild pain or moderate pain.    [provider]  Calcium Carbonate-Vitamin D (CALTRATE 600+D PO) Take 1 tablet by mouth 2 (two) times daily.    [provider]  DILANTIN 100 MG ER capsule Take 4 capsules (400 mg total) by mouth daily. 04/11/17   Maple Hudson., MD  lidocaine (LIDODERM) 5 % PLACE 1 PATCH ONTO THE SKIN DAILY. REMOVE AND DISCARD PATCH WITHIN 12HOURSOR AS DIRECTED BY MD. 10/12/16   Maple Hudson., MD  losartan (COZAAR) 100 MG tablet Take 0.5 tablets (50 mg total) by mouth daily. 06/21/16   Maple Hudson., MD    metoprolol tartrate (LOPRESSOR) 25 MG tablet Take 25 mg by mouth 2 (two) times daily.  12/23/14   [provider]  Multiple Vitamin (MULTIVITAMIN WITH MINERALS) TABS tablet Take 1 tablet by mouth at bedtime.    [provider]  Omega-3 Fatty Acids (FISH OIL) 1200 MG CAPS Take 1 capsule by mouth 2 (two) times daily.  11/09/10   [provider]  potassium chloride (K-DUR) 10 MEQ tablet Take 1 tablet (10 mEq total) by mouth at bedtime. 04/11/17   Maple Hudson., MD  psyllium (REGULOID) 0.52 G capsule Take 0.52 g by mouth 2 (two) times daily.    [provider]  sulfamethoxazole-trimethoprim (BACTRIM,SEPTRA) 400-80 MG tablet Take 1 tablet by mouth 2 (two) times daily. 08/16/16   Maple Hudson., MD  torsemide (DEMADEX) 20 MG tablet Take 1 tablet (20 mg total) by mouth daily. Patient taking differently: Take 40 mg by mouth daily.  06/21/16   Maple Hudson., MD  valACYclovir (VALTREX) 1000 MG tablet Take 1 tablet (1,000 mg total) by mouth 2 (two) times daily. For one day. 03/01/17   Maple Hudson., MD  warfarin (COUMADIN) 3 MG tablet Take 2 tablets (6 mg total) by mouth daily. 05/26/15   Lorie Phenix, MD  warfarin (COUMADIN) 4 MG tablet TAKE 1 TABLET EVERY DAY WITH A 3MG  TABLET 05/10/15   07/11/15, MD  warfarin (COUMADIN) 6 MG tablet Take 1 tablet (6 mg total) by mouth daily. 05/12/16   05/14/16,  Leonette Monarch., MD    Patient Active Problem List   Diagnosis Date Noted  . Chronic bilateral low back pain 10/13/2016  . Recurrent oral herpes simplex 04/30/2015  . Arthritis 04/15/2015  . Gonalgia 04/15/2015  . Cervical polyp 04/15/2015  . Encounter for screening for diabetes mellitus 04/15/2015  . Edema 04/15/2015  . Abnormal liver enzymes 04/15/2015  . Essential (primary) hypertension 04/15/2015  . H/O head injury 04/15/2015  . Hypercholesteremia 04/15/2015  . Mitral regurgitation and mitral stenosis 04/15/2015  . Adiposity  04/15/2015  . AF (paroxysmal atrial fibrillation) (HCC) 04/15/2015  . Bundle branch block, right 04/15/2015  . Cerebral seizure 04/15/2015  . 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 Urge incontinence 04/15/2015  . Avitaminosis D 04/15/2015  . Arthritis of right hip 11/06/2014  . 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 Breath shortness 07/28/2014    Past Medical History:  Diagnosis Date  . Dysrhythmia    A FIB / FOLLOWED BY DR Nolon Rod AT [facility]  . Edema of both legs    CHRONIC  . Hyperlipidemia   . Hypertension   . Mitral valve disorder    "deformed"  . Neuromuscular disorder (HCC)   . Rheumatoid arthritis (HCC)    RECENT DX - HAS NOT YET SEEN RHEUMATOLOGIST  . Seizures (HCC)    LAST SEIZURE JAN 1995  . Shortness of breath dyspnea    WITH EXERTION    Social History   Social History  . Marital status: Married    Spouse name: N/A  . Number of children: N/A  . Years of education: N/A   Occupational History  . Not on file.   Social History Main Topics  . Smoking status: Never Smoker  . Smokeless tobacco: Never Used  . Alcohol use No     Comment: OCCASIONAL WINE ONLY  . Drug use: No  . Sexual activity: Not on file   Other Topics Concern  . Not on file   Social History Narrative  . No narrative on file    No Known Allergies  Review of Systems  Constitutional: Negative.   HENT: Negative.   Eyes: Negative.   Respiratory: Negative.   Cardiovascular: Negative.   Gastrointestinal: Negative.   Genitourinary: Negative.   Musculoskeletal: Positive for joint pain.  Skin: Negative.   Endo/Heme/Allergies: Negative.   Psychiatric/Behavioral: Negative.     Immunization History  Administered Date(s) Administered  . Influenza,inj,Quad PF,36+ Mos 08/11/2016  . Tdap 11/02/2010    Objective:  BP 122/66 (BP Location: Left Arm, Patient Position: Sitting, Cuff Size: Large)   Pulse (!) 58   Temp 98 F (36.7 C) (Oral)   Resp 16   Wt 278 lb (126.1 kg)    SpO2 98%   BMI 47.72 kg/m   Physical Exam  Constitutional: She is oriented to person, place, and time and well-developed, well-nourished, and in no distress.  HENT:  Head: Normocephalic and atraumatic.  Right Ear: External ear normal.  Left Ear: External ear normal.  Nose: Nose normal.  Eyes: Conjunctivae are normal. No scleral icterus.  Neck: Neck supple. No thyromegaly present.  Cardiovascular: Normal rate, regular rhythm and normal heart sounds.   Pulmonary/Chest: Effort normal and breath sounds normal.  Abdominal: Soft.  Musculoskeletal: She exhibits edema (1+ edema with support hose. ).  Neurological: She is alert and oriented to person, place, and time.  Skin: Skin is warm and dry.  Psychiatric: Mood, memory, affect and judgment normal.    Lab Results  Component Value Date   WBC 4.0 03/01/2016   HGB 15.5 03/01/2016   HCT 44.6 03/01/2016   PLT 183 03/01/2016   GLUCOSE 92 08/18/2016   CHOL 287 (H) 03/01/2016   TRIG 178 (H) 03/01/2016   HDL 68 03/01/2016   LDLCALC 183 (H) 03/01/2016   TSH 2.590 03/01/2016   INR 3.0 04/25/2017   HGBA1C 5.6 03/01/2016    CMP     Component Value Date/Time   NA 142 08/18/2016 0940   K 3.9 08/18/2016 0940   CL 96 08/18/2016 0940   CO2 28 08/18/2016 0940   GLUCOSE 92 08/18/2016 0940   GLUCOSE 115 (H) 11/07/2014 0500   BUN 17 08/18/2016 0940   CREATININE 0.94 08/18/2016 0940   CALCIUM 9.1 08/18/2016 0940   PROT 7.5 03/01/2016 1008   ALBUMIN 4.5 03/01/2016 1008   AST 27 03/01/2016 1008   ALT 31 03/01/2016 1008   ALKPHOS 133 (H) 03/01/2016 1008   BILITOT 0.4 03/01/2016 1008   GFRNONAA 66 08/18/2016 0940   GFRAA 76 08/18/2016 0940    Assessment and Plan :  1. Mitral valve stenosis, unspecified etiology  - POCT INR 3.0 today. Continue current dose. Follow up in 3-4 weeks.    2. Essential (primary) hypertension  - CBC with Differential/Platelet - TSH  3. Hypercholesteremia  - Lipid Panel With LDL/HDL Ratio -  Comprehensive metabolic panel  4. Cerebral seizure  - Phenytoin level, total  5. Arthralgia of left hand Possible gout vs OA. If continues will gt x rays.  - Uric acid - Sedimentation rate   Follow up in the fall for CPE.   HPI, Exam, and A&P Transcribed under the direction and in the presence of Richard L. Wendelyn Breslow, MD  Electronically Signed: Silvio Pate, CMA  Julieanne Manson MD [facility] 04/25/2017 12:07 PM

## 2017-05-23 ENCOUNTER — Ambulatory Visit (INDEPENDENT_AMBULATORY_CARE_PROVIDER_SITE_OTHER): Payer: BC Managed Care – PPO

## 2017-05-23 DIAGNOSIS — I050 Rheumatic mitral stenosis: Secondary | ICD-10-CM

## 2017-05-23 LAB — POCT INR
INR: 4.7
PT: 56.6

## 2017-05-23 MED ORDER — WARFARIN SODIUM 5 MG PO TABS: 5.0000 mg | ORAL_TABLET | Freq: Every day | ORAL | 3 refills | Status: DC

## 2017-05-23 NOTE — Patient Instructions (Signed)
Anticoagulation Warfarin Dose Instructions as of 05/23/2017      Glynis Smiles Tue Wed Thu Fri Sat   New Dose 5 mg 5 mg 5 mg 5 mg 5 mg 5 mg 5 mg    Description   Hold 1 day, then take 5 mg daily. Recheck in 1 week.

## 2017-05-26 LAB — CBC WITH DIFFERENTIAL/PLATELET
BASOS ABS: 0 10*3/uL (ref 0.0–0.2)
Basos: 1 %
EOS (ABSOLUTE): 0.1 10*3/uL (ref 0.0–0.4)
Eos: 2 %
HEMATOCRIT: 43.2 % (ref 34.0–46.6)
HEMOGLOBIN: 14.3 g/dL (ref 11.1–15.9)
Immature Grans (Abs): 0 10*3/uL (ref 0.0–0.1)
Immature Granulocytes: 0 %
LYMPHS ABS: 2 10*3/uL (ref 0.7–3.1)
Lymphs: 38 %
MCH: 30.8 pg (ref 26.6–33.0)
MCHC: 33.1 g/dL (ref 31.5–35.7)
MCV: 93 fL (ref 79–97)
MONOCYTES: 9 %
MONOS ABS: 0.5 10*3/uL (ref 0.1–0.9)
NEUTROS ABS: 2.6 10*3/uL (ref 1.4–7.0)
Neutrophils: 50 %
Platelets: 197 10*3/uL (ref 150–379)
RBC: 4.65 x10E6/uL (ref 3.77–5.28)
RDW: 14.9 % (ref 12.3–15.4)
WBC: 5.2 10*3/uL (ref 3.4–10.8)

## 2017-05-26 LAB — COMPREHENSIVE METABOLIC PANEL
ALBUMIN: 4.3 g/dL (ref 3.6–4.8)
ALT: 37 IU/L — ABNORMAL HIGH (ref 0–32)
AST: 27 IU/L (ref 0–40)
Albumin/Globulin Ratio: 1.4 (ref 1.2–2.2)
Alkaline Phosphatase: 131 IU/L — ABNORMAL HIGH (ref 39–117)
BUN / CREAT RATIO: 16 (ref 12–28)
BUN: 15 mg/dL (ref 8–27)
Bilirubin Total: 0.3 mg/dL (ref 0.0–1.2)
CALCIUM: 9.4 mg/dL (ref 8.7–10.3)
CO2: 27 mmol/L (ref 20–29)
CREATININE: 0.91 mg/dL (ref 0.57–1.00)
Chloride: 100 mmol/L (ref 96–106)
GFR, EST AFRICAN AMERICAN: 79 mL/min/{1.73_m2} (ref >59)
GFR, EST NON AFRICAN AMERICAN: 68 mL/min/{1.73_m2} (ref >59)
GLOBULIN, TOTAL: 3 g/dL (ref 1.5–4.5)
Glucose: 105 mg/dL — ABNORMAL HIGH (ref 65–99)
Potassium: 4 mmol/L (ref 3.5–5.2)
SODIUM: 145 mmol/L — AB (ref 134–144)
Total Protein: 7.3 g/dL (ref 6.0–8.5)

## 2017-05-26 LAB — URIC ACID: Uric Acid: 8.1 mg/dL — ABNORMAL HIGH (ref 2.5–7.1)

## 2017-05-26 LAB — LIPID PANEL WITH LDL/HDL RATIO
CHOLESTEROL TOTAL: 266 mg/dL — AB (ref 100–199)
HDL: 80 mg/dL (ref >39)
LDL Calculated: 154 mg/dL — ABNORMAL HIGH (ref 0–99)
LDl/HDL Ratio: 1.9 ratio (ref 0.0–3.2)
TRIGLYCERIDES: 161 mg/dL — AB (ref 0–149)
VLDL Cholesterol Cal: 32 mg/dL (ref 5–40)

## 2017-05-26 LAB — TSH: TSH: 4.64 u[IU]/mL — ABNORMAL HIGH (ref 0.450–4.500)

## 2017-05-26 LAB — PHENYTOIN LEVEL, TOTAL: PHENYTOIN (DILANTIN), SERUM: 16.6 ug/mL (ref 10.0–20.0)

## 2017-05-26 LAB — SEDIMENTATION RATE: SED RATE: 16 mm/h (ref 0–40)

## 2017-05-30 ENCOUNTER — Ambulatory Visit (INDEPENDENT_AMBULATORY_CARE_PROVIDER_SITE_OTHER): Payer: BC Managed Care – PPO

## 2017-05-30 DIAGNOSIS — I050 Rheumatic mitral stenosis: Secondary | ICD-10-CM | POA: Diagnosis not present

## 2017-05-30 LAB — POCT INR
INR: 1.9
PT: 22.6

## 2017-05-30 NOTE — Progress Notes (Signed)
Anticoagulation Warfarin Dose Instructions as of 05/30/2017      Victoria Davila Tue Wed Thu Fri Sat   New Dose 5 mg 6 mg 5 mg 6 mg 5 mg 6 mg 5 mg    Description   5 mg every day except 6 mg on Monday, Wednesday and Friday. Recheck in 2 weeks.

## 2017-06-15 ENCOUNTER — Other Ambulatory Visit: Payer: Self-pay

## 2017-06-15 ENCOUNTER — Ambulatory Visit (INDEPENDENT_AMBULATORY_CARE_PROVIDER_SITE_OTHER): Payer: BC Managed Care – PPO

## 2017-06-15 DIAGNOSIS — I050 Rheumatic mitral stenosis: Secondary | ICD-10-CM

## 2017-06-15 LAB — POCT INR
INR: 2.6
PT: 31.2

## 2017-06-15 MED ORDER — WARFARIN SODIUM 6 MG PO TABS: 6.0000 mg | ORAL_TABLET | Freq: Every day | ORAL | 12 refills | Status: DC

## 2017-07-09 ENCOUNTER — Other Ambulatory Visit: Payer: Self-pay | Admitting: Family Medicine

## 2017-07-09 DIAGNOSIS — I10 Essential (primary) hypertension: Secondary | ICD-10-CM

## 2017-07-11 ENCOUNTER — Ambulatory Visit: Payer: BC Managed Care – PPO

## 2017-07-25 ENCOUNTER — Ambulatory Visit (INDEPENDENT_AMBULATORY_CARE_PROVIDER_SITE_OTHER): Payer: BC Managed Care – PPO

## 2017-07-25 DIAGNOSIS — I050 Rheumatic mitral stenosis: Secondary | ICD-10-CM | POA: Diagnosis not present

## 2017-07-25 LAB — POCT INR
INR: 4.1
PT: 48.7

## 2017-07-25 NOTE — Progress Notes (Signed)
Anticoagulation Warfarin Dose Instructions as of 07/25/2017      Victoria Davila Tue Wed Thu Fri Sat   New Dose 5 mg 5 mg 5 mg Hold Hold 5 mg 5 mg    Description   Hold for 2 days then 5 mg every day . Recheck in 1 week.

## 2017-08-01 ENCOUNTER — Ambulatory Visit (INDEPENDENT_AMBULATORY_CARE_PROVIDER_SITE_OTHER): Payer: BC Managed Care – PPO

## 2017-08-01 ENCOUNTER — Telehealth: Payer: Self-pay

## 2017-08-01 DIAGNOSIS — I050 Rheumatic mitral stenosis: Secondary | ICD-10-CM

## 2017-08-01 DIAGNOSIS — Z23 Encounter for immunization: Secondary | ICD-10-CM

## 2017-08-01 LAB — POCT INR
INR: 2.1
Prothrombin Time: 25.6

## 2017-08-01 NOTE — Telephone Encounter (Signed)
Pt asking about PT results and if she needs to change dose. PT note from today is not done; please advise.

## 2017-08-01 NOTE — Telephone Encounter (Signed)
Victoria Davila did you have this patient today for PT? I do not see a note but see she had appointment today. Please review and advise patient. Thank Neta Mends, RMA

## 2017-08-01 NOTE — Patient Instructions (Signed)
Anticoagulation Warfarin Dose Instructions as of 08/01/2017      Victoria Davila Tue Wed Thu Fri Sat   New Dose 5 mg 5 mg 5 mg 5 mg 5 mg 5 mg 5 mg   Alt Week 5 mg 5 mg 5 mg 5 mg 5 mg 5 mg 5 mg    Description    5 mg every day . Recheck in 2 week.

## 2017-08-02 NOTE — Telephone Encounter (Signed)
Yes I saw her and documented. KW

## 2017-08-02 NOTE — Telephone Encounter (Signed)
Ok thank you-Devin Foskey V Tequan Redmon, RMA  

## 2017-08-15 ENCOUNTER — Ambulatory Visit (INDEPENDENT_AMBULATORY_CARE_PROVIDER_SITE_OTHER): Payer: BC Managed Care – PPO

## 2017-08-15 DIAGNOSIS — I050 Rheumatic mitral stenosis: Secondary | ICD-10-CM

## 2017-08-15 LAB — POCT INR
INR: 2.6
PT: 31.7

## 2017-08-15 NOTE — Patient Instructions (Signed)
INR as of 08/15/2017 and Previous Warfarin Dosing Information    INR Dt INR Goal Jacki Cones Sun Mon Tue Wed Thu Fri Sat   08/15/2017 2.6 2.0-3.0 35 mg 5 mg 5 mg 5 mg 5 mg 5 mg 5 mg 5 mg   Alternate week 35 mg 5 mg 5 mg 5 mg 5 mg 5 mg 5 mg 5 mg    Previous description    5 mg every day . Recheck in 2 week.   Anticoagulation Warfarin Dose Instructions as of 08/15/2017      Total Sun Mon Tue Wed Thu Fri Sat   New Dose 35 mg 5 mg 5 mg 5 mg 5 mg 5 mg 5 mg 5 mg     (5 mg x 1)  (5 mg x 1)  (5 mg x 1)  (5 mg x 1)  (5 mg x 1)  (5 mg x 1)  (5 mg x 1)               Alternate week 35 mg 5 mg 5 mg 5 mg 5 mg 5 mg 5 mg 5 mg     (5 mg x 1)  (5 mg x 1)  (5 mg x 1)  (5 mg x 1)  (5 mg x 1)  (5 mg x 1)  (5 mg x 1)                Description    5 mg every day . Recheck in 3 week.

## 2017-08-30 DIAGNOSIS — I5032 Chronic diastolic (congestive) heart failure: Secondary | ICD-10-CM | POA: Insufficient documentation

## 2017-09-05 ENCOUNTER — Other Ambulatory Visit: Payer: Self-pay

## 2017-09-05 ENCOUNTER — Ambulatory Visit: Payer: Self-pay

## 2017-09-05 DIAGNOSIS — I052 Rheumatic mitral stenosis with insufficiency: Secondary | ICD-10-CM

## 2017-09-05 LAB — PROTIME-INR
INR: 2.7 — ABNORMAL HIGH
PROTHROMBIN TIME: 28.8 s — AB (ref 9.0–11.5)

## 2017-09-12 ENCOUNTER — Ambulatory Visit (INDEPENDENT_AMBULATORY_CARE_PROVIDER_SITE_OTHER): Payer: BC Managed Care – PPO | Admitting: Family Medicine

## 2017-09-12 ENCOUNTER — Encounter: Payer: Self-pay | Admitting: Family Medicine

## 2017-09-12 VITALS — BP 110/62 | HR 88 | Temp 98.1°F | Resp 16 | Ht 64.0 in | Wt 270.0 lb

## 2017-09-12 DIAGNOSIS — Z1231 Encounter for screening mammogram for malignant neoplasm of breast: Secondary | ICD-10-CM | POA: Diagnosis not present

## 2017-09-12 DIAGNOSIS — Z1211 Encounter for screening for malignant neoplasm of colon: Secondary | ICD-10-CM

## 2017-09-12 DIAGNOSIS — Z1239 Encounter for other screening for malignant neoplasm of breast: Secondary | ICD-10-CM

## 2017-09-12 DIAGNOSIS — Z0000 Encounter for general adult medical examination without abnormal findings: Secondary | ICD-10-CM | POA: Diagnosis not present

## 2017-09-12 NOTE — Progress Notes (Signed)
Patient: Victoria Davila, Female    DOB: 09-30-1956, 60 y.o.   MRN: [id number] Visit Date: 09/12/2017  Today's Provider: Megan Mans, MD   Chief Complaint  Patient presents with  . Annual Exam   Subjective:    Annual physical exam Victoria Davila is a 61 y.o. female who presents today for health maintenance and complete physical. She feels fairly well. She reports she is not exercising. She reports she is sleeping fairly well.  ----------------------------------------------------------------- Colonoscopy- 05/15/11 Dr. Mechele Collin, hemorrhoids repeat 10 years Pap- 01/19/11 normal Mammogram- 02/20/11 negative  Review of Systems  Constitutional: Positive for fatigue.  HENT: Negative.   Eyes: Negative.   Respiratory: Positive for shortness of breath.   Cardiovascular: Positive for leg swelling.  Gastrointestinal: Negative.   Endocrine: Negative.   Genitourinary: Positive for enuresis.  Musculoskeletal: Positive for arthralgias, back pain, joint swelling, neck pain and neck stiffness.  Skin: Negative.   Allergic/Immunologic: Negative.   Neurological: Positive for seizures.  Hematological: Negative.   Psychiatric/Behavioral: Positive for decreased concentration.    Social History      She  reports that  has never smoked. she has never used smokeless tobacco. She reports that she does not drink alcohol or use drugs.       Social History   Socioeconomic History  . Marital status: Married    Spouse name: None  . Number of children: None  . Years of education: None  . Highest education level: None  Social Needs  . Financial resource strain: None  . Food insecurity - worry: None  . Food insecurity - inability: None  . Transportation needs - medical: None  . Transportation needs - non-medical: None  Occupational History  . None  Tobacco Use  . Smoking status: Never Smoker  . Smokeless tobacco: Never Used  Substance and Sexual Activity  . Alcohol  use: No    Comment: OCCASIONAL WINE ONLY  . Drug use: No  . Sexual activity: None  Other Topics Concern  . None  Social History Narrative  . None    Past Medical History:  Diagnosis Date  . Dysrhythmia    A FIB / FOLLOWED BY DR Nolon Rod AT [facility]  . Edema of both legs    CHRONIC  . Hyperlipidemia   . Hypertension   . Mitral valve disorder    "deformed"  . Neuromuscular disorder (HCC)   . Rheumatoid arthritis (HCC)    RECENT DX - HAS NOT YET SEEN RHEUMATOLOGIST  . Seizures (HCC)    LAST SEIZURE JAN 1995  . Shortness of breath dyspnea    WITH EXERTION     Patient Active Problem List   Diagnosis Date Noted  . Chronic bilateral low back pain 10/13/2016  . Recurrent oral herpes simplex 04/30/2015  . Arthritis 04/15/2015  . Gonalgia 04/15/2015  . Cervical polyp 04/15/2015  . Encounter for screening for diabetes mellitus 04/15/2015  . Edema 04/15/2015  . Abnormal liver enzymes 04/15/2015  . Essential (primary) hypertension 04/15/2015  . H/O head injury 04/15/2015  . Hypercholesteremia 04/15/2015  . Mitral regurgitation and mitral stenosis 04/15/2015  . Adiposity 04/15/2015  . AF (paroxysmal atrial fibrillation) (HCC) 04/15/2015  . Bundle branch block, right 04/15/2015  . Cerebral seizure 04/15/2015  . 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 Urge incontinence 04/15/2015  . Avitaminosis D 04/15/2015  . Arthritis of right hip 11/06/2014  . 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 Breath shortness 07/28/2014  Past Surgical History:  Procedure Laterality Date  . ABDOMINAL HYSTERECTOMY  2012  . CARDIAC ELECTROPHYSIOLOGY MAPPING AND ABLATION    . CARDIAC VALVE SURGERY  2001   "heart valve stretched" for mitral valve stenosis  . CERVICAL POLYPECTOMY    . KNEE ARTHROSCOPY  2012   left    Family History        Family Status  Relation Name Status  . Mother  Deceased at age 21       A  MI, ERSD/Dialysis  . Father  Deceased at age 35       arrhythmia   . Sister  Alive  . PGF  Alive  . MGF  Deceased at age 68's       MI  . PGM  Deceased       Natural Causes  . Sister  Alive        Her family history includes Arthritis in her father; Cancer in her sister; Dementia in her father; Diabetes in her mother; Hip fracture in her mother; Lung disease in her paternal grandfather.     No Known Allergies   Current Outpatient Medications:  .  acetaminophen (TYLENOL) 500 MG tablet, Take 1,000 mg by mouth every 6 (six) hours as needed for mild pain or moderate pain., Disp: , Rfl:  .  Calcium Carbonate-Vitamin D (CALTRATE 600+D PO), Take 1 tablet by mouth 2 (two) times daily., Disp: , Rfl:  .  DILANTIN 100 MG ER capsule, Take 4 capsules (400 mg total) by mouth daily., Disp: 120 capsule, Rfl: 5 .  losartan (COZAAR) 100 MG tablet, TAKE 1/2 TABLET BY MOUTH ONCE DAILY AS DIRECTED BY PHYSICIAN, Disp: 30 tablet, Rfl: 11 .  Multiple Vitamin (MULTIVITAMIN WITH MINERALS) TABS tablet, Take 1 tablet by mouth at bedtime., Disp: , Rfl:  .  Omega-3 Fatty Acids (FISH OIL) 1200 MG CAPS, Take 1 capsule by mouth 2 (two) times daily. , Disp: , Rfl:  .  potassium chloride (K-DUR) 10 MEQ tablet, Take 1 tablet (10 mEq total) by mouth at bedtime., Disp: 90 tablet, Rfl: 3 .  psyllium (REGULOID) 0.52 G capsule, Take 0.52 g by mouth 2 (two) times daily., Disp: , Rfl:  .  torsemide (DEMADEX) 20 MG tablet, Take 1 tablet (20 mg total) by mouth daily. (Patient taking differently: Take 40 mg by mouth daily. ), Disp: 30 tablet, Rfl: 5 .  valACYclovir (VALTREX) 1000 MG tablet, Take 1 tablet (1,000 mg total) by mouth 2 (two) times daily. For one day., Disp: 60 tablet, Rfl: 5 .  warfarin (COUMADIN) 5 MG tablet, Take 1 tablet (5 mg total) by mouth daily., Disp: 30 tablet, Rfl: 3 .  Ascorbic Acid (VITAMIN C ER PO), Take by mouth., Disp: , Rfl:  .  lidocaine (LIDODERM) 5 %, PLACE 1 PATCH ONTO THE SKIN DAILY. REMOVE AND DISCARD PATCH WITHIN 12HOURSOR AS DIRECTED BY MD. (Patient not taking:  Reported on 04/25/2017), Disp: 30 patch, Rfl: 5 .  metoprolol tartrate (LOPRESSOR) 50 MG tablet, Take by mouth., Disp: , Rfl:  .  warfarin (COUMADIN) 3 MG tablet, Take 2 tablets (6 mg total) by mouth daily. (Patient not taking: Reported on 09/12/2017), Disp: 60 tablet, Rfl: 5 .  warfarin (COUMADIN) 4 MG tablet, TAKE 1 TABLET EVERY DAY WITH A 3MG  TABLET (Patient not taking: Reported on 09/12/2017), Disp: 30 tablet, Rfl: 5 .  warfarin (COUMADIN) 6 MG tablet, Take 1 tablet (6 mg total) by mouth daily. (Patient not taking: Reported on 09/12/2017), Disp:  30 tablet, Rfl: 12   Patient Care Team: Maple Hudson., MD as PCP - General (Family Medicine)      Objective:   Vitals: BP 110/62 (BP Location: Left Arm, Patient Position: Sitting, Cuff Size: Large)   Pulse 88   Temp 98.1 F (36.7 C) (Oral)   Resp 16   Ht 5\' 4"  (1.626 m)   Wt 270 lb (122.5 kg)   SpO2 98%   BMI 46.35 kg/m    Vitals:   09/12/17 1011  BP: 110/62  Pulse: 88  Resp: 16  Temp: 98.1 F (36.7 C)  TempSrc: Oral  SpO2: 98%  Weight: 270 lb (122.5 kg)  Height: 5\' 4"  (1.626 m)     Physical Exam   Depression Screen PHQ 2/9 Scores 04/25/2017 02/29/2016  PHQ - 2 Score 0 0  PHQ- 9 Score 0 -      Assessment & Plan:     Routine Health Maintenance and Physical Exam  Exercise Activities and Dietary recommendations Goals    None      Immunization History  Administered Date(s) Administered  . H1N1 08/28/2008  . Influenza,inj,Quad PF,6+ Mos 08/11/2016, 08/01/2017  . Influenza-Unspecified 08/11/2013  . Tdap 07/02/2008, 11/02/2010    Health Maintenance  Topic Date Due  . HIV Screening  06/19/1970  . MAMMOGRAM  02/19/2010  . PAP SMEAR  07/03/2011  . TETANUS/TDAP  11/02/2020  . COLONOSCOPY  05/14/2021  . INFLUENZA VACCINE  Completed  . Hepatitis C Screening  Completed     Discussed health benefits of physical activity, and encouraged her to engage in regular exercise appropriate for her age and  condition.  S/p Left THR,TAH,L knee Arthroscopy AFib Seizure Disorder Mitral regurge/stenosis   --------------------------------------------------------------------   I have done the exam and reviewed the above chart and it is accurate to the best of my knowledge. 12/31/2020 has been used in this note in any air is in the dictation or transcription are unintentional.  05/16/2021, MD  [facility]

## 2017-10-05 ENCOUNTER — Ambulatory Visit: Payer: BC Managed Care – PPO

## 2017-10-05 DIAGNOSIS — I050 Rheumatic mitral stenosis: Secondary | ICD-10-CM

## 2017-10-05 LAB — CBC WITH DIFFERENTIAL/PLATELET
BASOS ABS: 50 {cells}/uL (ref 0–200)
BASOS PCT: 0.8 %
Eosinophils Absolute: 93 cells/uL (ref 15–500)
Eosinophils Relative: 1.5 %
HEMATOCRIT: 39.7 % (ref 35.0–45.0)
HEMOGLOBIN: 13.9 g/dL (ref 11.7–15.5)
LYMPHS ABS: 1748 {cells}/uL (ref 850–3900)
MCH: 31.2 pg (ref 27.0–33.0)
MCHC: 35 g/dL (ref 32.0–36.0)
MCV: 89.2 fL (ref 80.0–100.0)
MPV: 10.9 fL (ref 7.5–12.5)
Monocytes Relative: 9.2 %
NEUTROS ABS: 3739 {cells}/uL (ref 1500–7800)
Neutrophils Relative %: 60.3 %
Platelets: 208 10*3/uL (ref 140–400)
RBC: 4.45 10*6/uL (ref 3.80–5.10)
RDW: 13.7 % (ref 11.0–15.0)
Total Lymphocyte: 28.2 %
WBC mixed population: 570 cells/uL (ref 200–950)
WBC: 6.2 10*3/uL (ref 3.8–10.8)

## 2017-10-05 LAB — COMPLETE METABOLIC PANEL WITH GFR
AG RATIO: 1.3 (calc) (ref 1.0–2.5)
ALBUMIN MSPROF: 4 g/dL (ref 3.6–5.1)
ALT: 25 U/L (ref 6–29)
AST: 24 U/L (ref 10–35)
Alkaline phosphatase (APISO): 135 U/L — ABNORMAL HIGH (ref 33–130)
BILIRUBIN TOTAL: 0.5 mg/dL (ref 0.2–1.2)
BUN/Creatinine Ratio: 18 (calc) (ref 6–22)
BUN: 18 mg/dL (ref 7–25)
CHLORIDE: 100 mmol/L (ref 98–110)
CO2: 30 mmol/L (ref 20–32)
Calcium: 9.3 mg/dL (ref 8.6–10.4)
Creat: 1.01 mg/dL — ABNORMAL HIGH (ref 0.50–0.99)
GFR, EST AFRICAN AMERICAN: 69 mL/min/{1.73_m2} (ref >=60)
GFR, Est Non African American: 60 mL/min/{1.73_m2} (ref >=60)
GLOBULIN: 3.2 g/dL (ref 1.9–3.7)
Glucose, Bld: 101 mg/dL — ABNORMAL HIGH (ref 65–99)
POTASSIUM: 3.8 mmol/L (ref 3.5–5.3)
SODIUM: 142 mmol/L (ref 135–146)
TOTAL PROTEIN: 7.2 g/dL (ref 6.1–8.1)

## 2017-10-05 LAB — LIPID PANEL
CHOL/HDL RATIO: 3.8 (calc) (ref <5.0)
Cholesterol: 259 mg/dL — ABNORMAL HIGH (ref <200)
HDL: 68 mg/dL (ref >50)
LDL CHOLESTEROL (CALC): 161 mg/dL — AB
NON-HDL CHOLESTEROL (CALC): 191 mg/dL — AB (ref <130)
TRIGLYCERIDES: 155 mg/dL — AB (ref <150)

## 2017-10-05 LAB — POCT INR
INR: 2.7
PT: 31.9

## 2017-10-05 LAB — TSH: TSH: 2.92 mIU/L (ref 0.40–4.50)

## 2017-10-16 ENCOUNTER — Other Ambulatory Visit: Payer: Self-pay | Admitting: Family Medicine

## 2017-11-02 ENCOUNTER — Ambulatory Visit: Payer: BC Managed Care – PPO

## 2017-11-02 ENCOUNTER — Other Ambulatory Visit: Payer: Self-pay | Admitting: Family Medicine

## 2017-11-02 DIAGNOSIS — I6789 Other cerebrovascular disease: Principal | ICD-10-CM

## 2017-11-02 DIAGNOSIS — I050 Rheumatic mitral stenosis: Secondary | ICD-10-CM

## 2017-11-02 DIAGNOSIS — I052 Rheumatic mitral stenosis with insufficiency: Secondary | ICD-10-CM | POA: Diagnosis not present

## 2017-11-02 DIAGNOSIS — G40909 Epilepsy, unspecified, not intractable, without status epilepticus: Secondary | ICD-10-CM

## 2017-11-02 LAB — POCT INR
INR: 3.1
PT: 37

## 2017-11-02 NOTE — Patient Instructions (Signed)
Description    5 mg every day . Patient is having surgery on 11/09/2017, and will stop coumadin on 1/6 and start on ASA 81mg . No changes today due to upcoming surgery.

## 2017-11-06 DIAGNOSIS — Z9229 Personal history of other drug therapy: Secondary | ICD-10-CM | POA: Insufficient documentation

## 2017-11-09 DIAGNOSIS — Z952 Presence of prosthetic heart valve: Secondary | ICD-10-CM | POA: Insufficient documentation

## 2017-11-09 DIAGNOSIS — M47812 Spondylosis without myelopathy or radiculopathy, cervical region: Secondary | ICD-10-CM | POA: Insufficient documentation

## 2017-11-09 DIAGNOSIS — Z9889 Other specified postprocedural states: Secondary | ICD-10-CM | POA: Insufficient documentation

## 2017-11-12 ENCOUNTER — Telehealth: Payer: Self-pay

## 2017-11-12 DIAGNOSIS — I444 Left anterior fascicular block: Secondary | ICD-10-CM | POA: Insufficient documentation

## 2017-11-12 NOTE — Telephone Encounter (Signed)
Kaleen Mask is calling from [facility] that wants to offer case management for patient and wants the PCP to know.She reports that patient is admitted at [facility]  For A-fib and Mitral Stenosis. Patient admitted on 01/10-present. [facility] [employer] Direct Line is [phone]  Thanks,  -Joseline

## 2017-11-12 NOTE — Telephone Encounter (Signed)
See below-Anastasiya V Hopkins, RMA  

## 2017-11-15 NOTE — Telephone Encounter (Signed)
Lauren advised on her voicemail that it is ok to offer case management services-Zachariah Pavek V Macaiah Mangal, RMA

## 2017-11-15 NOTE — Telephone Encounter (Signed)
ok 

## 2017-11-16 ENCOUNTER — Ambulatory Visit: Payer: BC Managed Care – PPO

## 2017-11-16 DIAGNOSIS — I050 Rheumatic mitral stenosis: Secondary | ICD-10-CM | POA: Diagnosis not present

## 2017-11-16 LAB — POCT INR
INR: 2.8
PT: 34.2

## 2017-11-16 NOTE — Patient Instructions (Signed)
Description    5 mg every day . Patient had surgery on 11/09/2017,she has been on 5 mg of Warfarin since the surgery.  New target range is 2.5-3.5.  No changes after today's testing

## 2017-11-23 ENCOUNTER — Ambulatory Visit: Payer: BC Managed Care – PPO | Admitting: *Deleted

## 2017-11-23 DIAGNOSIS — I050 Rheumatic mitral stenosis: Secondary | ICD-10-CM | POA: Diagnosis not present

## 2017-11-23 LAB — POCT INR
INR: 2.4
PT: 28.3

## 2017-11-23 NOTE — Patient Instructions (Signed)
5 mg every day . Patient had surgery on 11/09/2017,she has been on 5 mg of Warfarin since the surgery.  New target range is 2.5-3.5.  No changes after today's testing. Recheck 12/07/2017.

## 2017-12-05 ENCOUNTER — Ambulatory Visit (INDEPENDENT_AMBULATORY_CARE_PROVIDER_SITE_OTHER): Payer: BC Managed Care – PPO

## 2017-12-05 DIAGNOSIS — I050 Rheumatic mitral stenosis: Secondary | ICD-10-CM | POA: Diagnosis not present

## 2017-12-05 DIAGNOSIS — I052 Rheumatic mitral stenosis with insufficiency: Secondary | ICD-10-CM

## 2017-12-05 LAB — POCT INR
INR: 2.2
PT: 26.1

## 2017-12-05 NOTE — Patient Instructions (Signed)
Increase to 6mg  a day until Monday (12/10/2017).  Pt has a follow up appointment with Dr. 02/07/2018 at that time.

## 2017-12-07 ENCOUNTER — Ambulatory Visit: Payer: Self-pay

## 2017-12-08 ENCOUNTER — Ambulatory Visit (INDEPENDENT_AMBULATORY_CARE_PROVIDER_SITE_OTHER): Payer: BC Managed Care – PPO | Admitting: Physician Assistant

## 2017-12-08 ENCOUNTER — Encounter: Payer: Self-pay | Admitting: Physician Assistant

## 2017-12-08 VITALS — BP 118/82 | HR 100 | Temp 99.4°F | Resp 16 | Wt 258.0 lb

## 2017-12-08 DIAGNOSIS — S21001A Unspecified open wound of right breast, initial encounter: Secondary | ICD-10-CM | POA: Diagnosis not present

## 2017-12-08 DIAGNOSIS — Z7901 Long term (current) use of anticoagulants: Secondary | ICD-10-CM

## 2017-12-08 DIAGNOSIS — Z952 Presence of prosthetic heart valve: Secondary | ICD-10-CM

## 2017-12-08 LAB — POCT INR
INR: 2.5
PT: 29.9

## 2017-12-08 NOTE — Patient Instructions (Signed)

## 2017-12-08 NOTE — Addendum Note (Signed)
Addended by: Kavin Leech E on: 12/08/2017 10:26 AM   Modules accepted: Orders

## 2017-12-08 NOTE — Progress Notes (Signed)
Patient: Victoria Davila Female    DOB: Feb 16, 1956   62 y.o.   MRN: [id number] Visit Date: 12/08/2017  Today's Provider: Trey Sailors, PA-C   Chief Complaint  Patient presents with  . Wound Check   Subjective:    Victoria Davila is a 62 y/o woman with history of mitral valve replacement surgery on 11/09/2017 who presents today for wound infection. She had surgery with [facility] and received a mechanical mitral valve. Since then she has been on warfarin therapy for anticoagulation.   On 11/29/2017 during follow up with [facility] and after she had her staples removed, she reported with elevated HR and elevated temperature. She was placed on Cefdinir x 10 days and has 1.5 days left of this therapy. Wound culture dated 12/02/2017 reveals Staph aureus that is sensitive to Cefazolin.   She reports today that her temperature has been around 99.8. She is not short of breath. Her heart rate is mildly elevated. She has baseline lower extremity edema. She does not report exposure to flu, respiratory symptoms, nausea, vomiting, confusion. Her INR on today's visit is 2.5.   Wound Check  Prior ED Treatment: Surgery. Maximum temperature: Last night 99.8 yesterday and 99.5 this morning.  The temperature was taken using an oral thermometer. There has been bloody discharge from the wound. There is no swelling present.      No Known Allergies   Current Outpatient Medications:  .  acetaminophen (TYLENOL) 500 MG tablet, Take 1,000 mg by mouth every 6 (six) hours as needed for mild pain or moderate pain., Disp: , Rfl:  .  Ascorbic Acid (VITAMIN C ER PO), Take by mouth., Disp: , Rfl:  .  Calcium Carbonate-Vitamin D (CALTRATE 600+D PO), Take 1 tablet by mouth 2 (two) times daily., Disp: , Rfl:  .  DILANTIN 100 MG ER capsule, TAKE 4 CAPSULES BY MOUTH DAILY, Disp: 120 capsule, Rfl: 5 .  lidocaine (LIDODERM) 5 %, PLACE 1 PATCH ONTO THE SKIN DAILY. REMOVE AND DISCARD PATCH WITHIN 12HOURSOR AS  DIRECTED BY MD., Disp: 30 patch, Rfl: 5 .  losartan (COZAAR) 100 MG tablet, TAKE 1/2 TABLET BY MOUTH ONCE DAILY AS DIRECTED BY PHYSICIAN, Disp: 30 tablet, Rfl: 11 .  Multiple Vitamin (MULTIVITAMIN WITH MINERALS) TABS tablet, Take 1 tablet by mouth at bedtime., Disp: , Rfl:  .  Omega-3 Fatty Acids (FISH OIL) 1200 MG CAPS, Take 1 capsule by mouth 2 (two) times daily. , Disp: , Rfl:  .  potassium chloride (K-DUR) 10 MEQ tablet, Take 1 tablet (10 mEq total) by mouth at bedtime., Disp: 90 tablet, Rfl: 3 .  psyllium (REGULOID) 0.52 G capsule, Take 0.52 g by mouth 2 (two) times daily., Disp: , Rfl:  .  torsemide (DEMADEX) 20 MG tablet, Take 1 tablet (20 mg total) by mouth daily. (Patient taking differently: Take 40 mg by mouth daily. ), Disp: 30 tablet, Rfl: 5 .  valACYclovir (VALTREX) 1000 MG tablet, Take 1 tablet (1,000 mg total) by mouth 2 (two) times daily. For one day., Disp: 60 tablet, Rfl: 5 .  warfarin (COUMADIN) 3 MG tablet, Take 2 tablets (6 mg total) by mouth daily., Disp: 60 tablet, Rfl: 5 .  warfarin (COUMADIN) 4 MG tablet, TAKE 1 TABLET EVERY DAY WITH A 3MG  TABLET, Disp: 30 tablet, Rfl: 5 .  warfarin (COUMADIN) 5 MG tablet, TAKE 1 TABLET BY MOUTH DAILY, Disp: 30 tablet, Rfl: 3 .  warfarin (COUMADIN) 6 MG tablet, Take 1  tablet (6 mg total) by mouth daily., Disp: 30 tablet, Rfl: 12 .  metoprolol tartrate (LOPRESSOR) 50 MG tablet, Take by mouth., Disp: , Rfl:   Review of Systems  Social History   Tobacco Use  . Smoking status: Never Smoker  . Smokeless tobacco: Never Used  Substance Use Topics  . Alcohol use: No    Comment: OCCASIONAL WINE ONLY   Objective:   BP 118/82 (BP Location: Right Arm, Patient Position: Sitting, Cuff Size: Large)   Pulse 100   Temp 99.4 F (37.4 C) (Oral)   Resp 16   Wt 258 lb (117 kg)   SpO2 95%   BMI 44.29 kg/m  Vitals:   12/08/17 0903  BP: 118/82  Pulse: 100  Resp: 16  Temp: 99.4 F (37.4 C)  TempSrc: Oral  SpO2: 95%  Weight: 258 lb (117 kg)      Physical Exam  Constitutional: She is oriented to person, place, and time. She appears well-developed and well-nourished.  Cardiovascular: Regular rhythm.  No murmur heard. Mechanical heart valve present.   Pulmonary/Chest: Effort normal and breath sounds normal. She has no wheezes. She has no rales.  Musculoskeletal: She exhibits edema.  1+ pitting edema in lower extremities.  Neurological: She is alert and oriented to person, place, and time.  Skin: Skin is warm and dry.  There is a wound on her right breast that is 2 cm in diameter by 1/2 cm deep. There is some serosanguinous but not purulent drainage on her gauze. There is some erythema surrounding staple insertions but no warmth or tenderness. The wound itself does not display any purulent drainage.   Psychiatric: She has a normal mood and affect. Her behavior is normal.    Media Information   Document Information   Photos  Right breast  12/08/2017 09:26  Attached To:  Office Visit on 12/08/17 with Trey Sailors, PA-C  Source Information   Maryella Shivers  [facility]        Assessment & Plan:     1. Wound of right breast, initial encounter  Patient with mildly elevated temperature, nontoxic, well compensated. No adverse heart or lung sounds. Serosanguinous drainage from wound but no significant erythema or purulent drainage. I have reviewed wound culture from [facility] on 12/02/2017 which revealed Staph Aureus sensitive to Cefazolin. She is currently taking Cefdinir and has 1.5 days left. I have reswabbed wound to check for new growth. INR today is 2.5 and is therapeutic. Plant to continue Cefdinir and have patient follow up with Dr. Sullivan Lone on Monday as scheduled. Have consulted with Dr. Sullivan Lone who is agreeable to this plan. Patient and husband aware of instructions to call [facility] with fever > 101F.   - Wound culture  2. H/O mitral valve replacement with mechanical valve   3.  Warfarin anticoagulation  INR 2.5 today.   - INR/PT  Return in about 2 days (around 12/10/2017) for scheduled visit with Dr. Sullivan Lone.  The entirety of the information documented in the History of Present Illness, Review of Systems and Physical Exam were personally obtained by me. Portions of this information were initially documented by Kavin Leech, CMA and reviewed by me for thoroughness and accuracy.        Trey Sailors, PA-C  [facility]

## 2017-12-10 ENCOUNTER — Ambulatory Visit (INDEPENDENT_AMBULATORY_CARE_PROVIDER_SITE_OTHER): Payer: BC Managed Care – PPO | Admitting: Family Medicine

## 2017-12-10 ENCOUNTER — Other Ambulatory Visit: Payer: Self-pay

## 2017-12-10 VITALS — BP 118/78 | HR 94 | Temp 99.7°F | Resp 16 | Wt 256.0 lb

## 2017-12-10 DIAGNOSIS — I6789 Other cerebrovascular disease: Secondary | ICD-10-CM | POA: Diagnosis not present

## 2017-12-10 DIAGNOSIS — G40909 Epilepsy, unspecified, not intractable, without status epilepticus: Secondary | ICD-10-CM

## 2017-12-10 DIAGNOSIS — I050 Rheumatic mitral stenosis: Secondary | ICD-10-CM

## 2017-12-10 DIAGNOSIS — R6889 Other general symptoms and signs: Secondary | ICD-10-CM

## 2017-12-10 LAB — POCT INFLUENZA A/B
INFLUENZA A, POC: NEGATIVE
Influenza B, POC: NEGATIVE

## 2017-12-10 LAB — POCT INR
INR: 2.5
PT: 30.3

## 2017-12-10 MED ORDER — DILANTIN 100 MG PO CAPS: 400.0000 mg | ORAL_CAPSULE | Freq: Every day | ORAL | 3 refills | Status: DC

## 2017-12-10 NOTE — Progress Notes (Signed)
Victoria Davila  MRN: [id number] DOB: 07-May-1956  Subjective:  HPI   The patient is a 62 year old female who presents for follow up of her surgical wound.  She was seen on Saturday with complaint of fever s/p cardiac valve replacement.  She was at the time on antibiotic and has since finished.  She has appointment with [facility] later today.  She reports that her temperature has been slightly higher getting reading of 101.5.  Other than the fever she is stable and has not had any other changes  Patient Active Problem List   Diagnosis Date Noted  . Chronic bilateral low back pain 10/13/2016  . Recurrent oral herpes simplex 04/30/2015  . Arthritis 04/15/2015  . Gonalgia 04/15/2015  . Cervical polyp 04/15/2015  . Encounter for screening for diabetes mellitus 04/15/2015  . Edema 04/15/2015  . Abnormal liver enzymes 04/15/2015  . Essential (primary) hypertension 04/15/2015  . H/O head injury 04/15/2015  . Hypercholesteremia 04/15/2015  . Mitral regurgitation and mitral stenosis 04/15/2015  . Adiposity 04/15/2015  . AF (paroxysmal atrial fibrillation) (HCC) 04/15/2015  . Bundle branch block, right 04/15/2015  . Cerebral seizure 04/15/2015  . 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 Urge incontinence 04/15/2015  . Avitaminosis D 04/15/2015  . Arthritis of right hip 11/06/2014  . 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 Breath shortness 07/28/2014    Past Medical History:  Diagnosis Date  . Dysrhythmia    A FIB / FOLLOWED BY DR Nolon Rod AT [facility]  . Edema of both legs    CHRONIC  . Hyperlipidemia   . Hypertension   . Mitral valve disorder    "deformed"  . Neuromuscular disorder (HCC)   . Rheumatoid arthritis (HCC)    RECENT DX - HAS NOT YET SEEN RHEUMATOLOGIST  . Seizures (HCC)    LAST SEIZURE JAN 1995  . Shortness of breath dyspnea    WITH EXERTION    Social History   Socioeconomic History  . Marital status: Married    Spouse name: Not on file  . Number  of children: Not on file  . Years of education: Not on file  . Highest education level: Not on file  Social Needs  . Financial resource strain: Not on file  . Food insecurity - worry: Not on file  . Food insecurity - inability: Not on file  . Transportation needs - medical: Not on file  . Transportation needs - non-medical: Not on file  Occupational History  . Not on file  Tobacco Use  . Smoking status: Never Smoker  . Smokeless tobacco: Never Used  Substance and Sexual Activity  . Alcohol use: No    Comment: OCCASIONAL WINE ONLY  . Drug use: No  . Sexual activity: Not on file  Other Topics Concern  . Not on file  Social History Narrative  . Not on file    Outpatient Encounter Medications as of 12/10/2017  Medication Sig Note  . acetaminophen (TYLENOL) 500 MG tablet Take 1,000 mg by mouth every 6 (six) hours as needed for mild pain or moderate pain.   Marland Kitchen DILANTIN 100 MG ER capsule TAKE 4 CAPSULES BY MOUTH DAILY   . folic acid (FOLVITE) 1 MG tablet Take 1 mg by mouth daily.   Marland Kitchen gabapentin (NEURONTIN) 100 MG capsule Take 100 mg by mouth 3 (three) times daily.   . metoprolol succinate (TOPROL-XL) 25 MG 24 hr tablet Take 25 mg by mouth daily.   Marland Kitchen  Multiple Vitamin (MULTIVITAMIN WITH MINERALS) TABS tablet Take 1 tablet by mouth at bedtime.   . Omega-3 Fatty Acids (FISH OIL) 1200 MG CAPS Take 1 capsule by mouth 2 (two) times daily.  04/13/2015: Received from: [employer]  . potassium chloride (K-DUR) 10 MEQ tablet Take 1 tablet (10 mEq total) by mouth at bedtime. (Patient taking differently: Take 30 mEq by mouth 2 (two) times daily. )   . psyllium (REGULOID) 0.52 G capsule Take 0.52 g by mouth 2 (two) times daily.   Marland Kitchen torsemide (DEMADEX) 20 MG tablet Take 1 tablet (20 mg total) by mouth daily. (Patient taking differently: Take 40 mg by mouth 2 (two) times daily. )   . valACYclovir (VALTREX) 1000 MG tablet Take 1 tablet (1,000 mg total) by mouth 2 (two) times daily. For  one day.   . warfarin (COUMADIN) 3 MG tablet Take 2 tablets (6 mg total) by mouth daily.   Marland Kitchen warfarin (COUMADIN) 4 MG tablet TAKE 1 TABLET EVERY DAY WITH A 3MG  TABLET   . warfarin (COUMADIN) 5 MG tablet TAKE 1 TABLET BY MOUTH DAILY   . warfarin (COUMADIN) 6 MG tablet Take 1 tablet (6 mg total) by mouth daily.   . [DISCONTINUED] Ascorbic Acid (VITAMIN C ER PO) Take by mouth.   . [DISCONTINUED] Calcium Carbonate-Vitamin D (CALTRATE 600+D PO) Take 1 tablet by mouth 2 (two) times daily.   . [DISCONTINUED] lidocaine (LIDODERM) 5 % PLACE 1 PATCH ONTO THE SKIN DAILY. REMOVE AND DISCARD PATCH WITHIN 12HOURSOR AS DIRECTED BY MD.   . [DISCONTINUED] losartan (COZAAR) 100 MG tablet TAKE 1/2 TABLET BY MOUTH ONCE DAILY AS DIRECTED BY PHYSICIAN   . [DISCONTINUED] metoprolol tartrate (LOPRESSOR) 25 MG tablet Take 25 mg by mouth.     No facility-administered encounter medications on file as of 12/10/2017.     No Known Allergies  Review of Systems  Constitutional: Positive for fever. Negative for malaise/fatigue.  Eyes: Negative.   Respiratory: Positive for cough. Negative for sputum production, shortness of breath and wheezing.   Cardiovascular: Positive for chest pain (soreness from surgery) and leg swelling. Negative for palpitations, orthopnea and claudication.  Skin: Negative.   Neurological: Positive for weakness.  Endo/Heme/Allergies: Negative.   Psychiatric/Behavioral: Negative.     Objective:  BP 118/78 (BP Location: Right Arm, Patient Position: Sitting, Cuff Size: Large)   Pulse 94   Temp 99.7 F (37.6 C) (Oral)   Resp 16   Wt 256 lb (116.1 kg)   BMI 43.94 kg/m   Physical Exam  Constitutional: She is oriented to person, place, and time and well-developed, well-nourished, and in no distress.  HENT:  Head: Normocephalic and atraumatic.  Eyes: Conjunctivae are normal. No scleral icterus.  Neck: No thyromegaly present.  Cardiovascular: Normal rate, regular rhythm and normal heart sounds.   Pulmonary/Chest: Effort normal and breath sounds normal.  Abdominal: Soft.  Neurological: She is alert and oriented to person, place, and time. Gait normal. GCS score is 15.  Skin: Skin is warm and dry.  Psychiatric: Mood, memory, affect and judgment normal.    Assessment and Plan :  1. Cerebral seizure  - DILANTIN 100 MG ER capsule; Take 4 capsules (400 mg total) by mouth daily.  Dispense: 360 capsule; Refill: 3  2. Flu-like symptoms  - POCT Influenza A/B  3. Mitral valve stenosis, unspecified etiology INR now 2.5-3.5. - POCT INR 4.Recent surgical wound infection Improving.  I have done the exam and reviewed the chart and it is  accurate to the best of my knowledge. Dentist has been used and  any errors in dictation or transcription are unintentional. Julieanne Manson M.D. [facility]

## 2017-12-10 NOTE — Patient Instructions (Signed)
Description   Increase to 6mg  a day until Monday (12/10/2017).  Pt has a follow up appointment with Dr. 02/07/2018 at that time.

## 2017-12-12 ENCOUNTER — Other Ambulatory Visit: Payer: Self-pay | Admitting: Physician Assistant

## 2017-12-12 ENCOUNTER — Telehealth: Payer: Self-pay | Admitting: Family Medicine

## 2017-12-12 LAB — WOUND CULTURE

## 2017-12-12 NOTE — Telephone Encounter (Signed)
Victoria Davila with [facility] stated they faxed a referral form on 12/04/17 for Dr. Sullivan Lone to complete and send back to their office. Victoria Davila wanted to make sure our office received the faxed because pt is scheduled to start with [facility] on 12/27/17 but they need the form completed before they can see the pt. Please advise. Thanks TNP

## 2017-12-17 ENCOUNTER — Telehealth: Payer: Self-pay

## 2017-12-17 NOTE — Telephone Encounter (Signed)
-----   Message from Trey Sailors, [place] sent at 12/17/2017  1:12 PM EST ----- Can we let this patient know the wound culture was canceled because the lab did not accept the tube I sent it in? I reviewed her chart visit with [facility] and saw that they put her on another round of cefdinir and her blood cultures and CXR were negative. I hope she is continuing to do well. My apologies for the inconvenience.

## 2017-12-17 NOTE — Telephone Encounter (Signed)
Patient was advised. KW 

## 2017-12-19 ENCOUNTER — Encounter: Payer: Self-pay | Admitting: Family Medicine

## 2017-12-19 ENCOUNTER — Ambulatory Visit: Payer: Self-pay

## 2017-12-19 ENCOUNTER — Ambulatory Visit (INDEPENDENT_AMBULATORY_CARE_PROVIDER_SITE_OTHER): Payer: BC Managed Care – PPO | Admitting: Family Medicine

## 2017-12-19 VITALS — BP 108/62 | HR 98 | Temp 98.0°F | Resp 14 | Wt 252.0 lb

## 2017-12-19 DIAGNOSIS — I050 Rheumatic mitral stenosis: Secondary | ICD-10-CM | POA: Diagnosis not present

## 2017-12-19 DIAGNOSIS — Z952 Presence of prosthetic heart valve: Secondary | ICD-10-CM | POA: Diagnosis not present

## 2017-12-19 DIAGNOSIS — S21001A Unspecified open wound of right breast, initial encounter: Secondary | ICD-10-CM | POA: Diagnosis not present

## 2017-12-19 LAB — POCT INR
INR: 2.7
PT: 33

## 2017-12-19 NOTE — Patient Instructions (Addendum)
Get a probiotic over the counter. Follow up in 2 weeks for INR and 2 months to see Dr. Sullivan Lone.

## 2017-12-19 NOTE — Progress Notes (Signed)
Patient: Victoria Davila Female    DOB: Jan 15, 1956   62 y.o.   MRN: [id number] Visit Date: 12/19/2017  Today's Provider: Megan Mans, MD   Chief Complaint  Patient presents with  . Follow-up   Subjective:    HPI Pt is here today for a follow up. She was seen on 12/10/17 for possible post op wound infection, mitral valve stenosis, flu like symptoms. MD at [facility] gave her Ceftin when he took her sutures out. She has not been running any more fevers and feeling better except she has been having diarrhea 4-5 times a day, small amounts, and watery. Her abdomen does not hurt but she gets a "pain to go". She has since stopped the antibiotic and has gone less so she thinks it may be related to the antibiotic. She reports that she does feel a little weakness from having the diarrhea.    INR today was 2.7 PT 33.0. She is taking 6 mg daily.      No Known Allergies   Current Outpatient Medications:  .  acetaminophen (TYLENOL) 500 MG tablet, Take 1,000 mg by mouth every 6 (six) hours as needed for mild pain or moderate pain., Disp: , Rfl:  .  aspirin 81 MG chewable tablet, Chew 81 mg by mouth daily., Disp: , Rfl:  .  DILANTIN 100 MG ER capsule, Take 4 capsules (400 mg total) by mouth daily., Disp: 360 capsule, Rfl: 3 .  gabapentin (NEURONTIN) 100 MG capsule, Take 100 mg by mouth 3 (three) times daily., Disp: , Rfl:  .  metoprolol succinate (TOPROL-XL) 25 MG 24 hr tablet, Take 25 mg by mouth daily., Disp: , Rfl:  .  Multiple Vitamin (MULTIVITAMIN WITH MINERALS) TABS tablet, Take 1 tablet by mouth at bedtime., Disp: , Rfl:  .  Omega-3 Fatty Acids (FISH OIL) 1200 MG CAPS, Take 1 capsule by mouth 2 (two) times daily. , Disp: , Rfl:  .  potassium chloride (K-DUR) 10 MEQ tablet, Take 1 tablet (10 mEq total) by mouth at bedtime. (Patient taking differently: Take 30 mEq by mouth 2 (two) times daily. ), Disp: 90 tablet, Rfl: 3 .  torsemide (DEMADEX) 20 MG tablet, Take 1 tablet (20 mg total)  by mouth daily. (Patient taking differently: Take 40 mg by mouth 2 (two) times daily. ), Disp: 30 tablet, Rfl: 5 .  valACYclovir (VALTREX) 1000 MG tablet, Take 1 tablet (1,000 mg total) by mouth 2 (two) times daily. For one day., Disp: 60 tablet, Rfl: 5 .  warfarin (COUMADIN) 3 MG tablet, Take 2 tablets (6 mg total) by mouth daily., Disp: 60 tablet, Rfl: 5 .  warfarin (COUMADIN) 4 MG tablet, TAKE 1 TABLET EVERY DAY WITH A 3MG  TABLET, Disp: 30 tablet, Rfl: 5 .  warfarin (COUMADIN) 5 MG tablet, TAKE 1 TABLET BY MOUTH DAILY, Disp: 30 tablet, Rfl: 3 .  warfarin (COUMADIN) 6 MG tablet, Take 1 tablet (6 mg total) by mouth daily., Disp: 30 tablet, Rfl: 12 .  cefUROXime (CEFTIN) 500 MG tablet, Take by mouth., Disp: , Rfl:  .  folic acid (FOLVITE) 1 MG tablet, Take 1 mg by mouth daily., Disp: , Rfl:  .  psyllium (REGULOID) 0.52 G capsule, Take 0.52 g by mouth 2 (two) times daily., Disp: , Rfl:   Review of Systems  HENT: Negative.   Eyes: Negative.   Respiratory: Negative.   Cardiovascular: Negative.   Gastrointestinal: Positive for diarrhea.  Endocrine: Negative.   Genitourinary: Negative.  Musculoskeletal: Negative.   Skin: Negative.   Allergic/Immunologic: Negative.   Neurological: Positive for weakness.  Hematological: Negative.   Psychiatric/Behavioral: Negative.     Social History   Tobacco Use  . Smoking status: Never Smoker  . Smokeless tobacco: Never Used  Substance Use Topics  . Alcohol use: No    Comment: OCCASIONAL WINE ONLY   Objective:   BP 108/62 (BP Location: Left Arm, Patient Position: Sitting, Cuff Size: Large)   Pulse 98   Temp 98 F (36.7 C) (Oral)   Resp 14   Wt 252 lb (114.3 kg)   SpO2 97%   BMI 43.26 kg/m  Vitals:   12/19/17 1049  BP: 108/62  Pulse: 98  Resp: 14  Temp: 98 F (36.7 C)  TempSrc: Oral  SpO2: 97%  Weight: 252 lb (114.3 kg)     Physical Exam  Constitutional: She is oriented to person, place, and time. She appears well-developed and  well-nourished.  Eyes: Conjunctivae and EOM are normal. Pupils are equal, round, and reactive to light.  Neck: Normal range of motion. Neck supple.  Cardiovascular: Normal rate, regular rhythm, normal heart sounds and intact distal pulses.  Pulmonary/Chest: Effort normal and breath sounds normal.  Musculoskeletal: Normal range of motion.  Neurological: She is alert and oriented to person, place, and time. She has normal reflexes.  Skin: Skin is warm and dry.  Psychiatric: She has a normal mood and affect. Her behavior is normal. Judgment and thought content normal.        Assessment & Plan:     1. Mitral valve stenosis, unspecified etiology  - POCT INR 2.7 today. Follow up in 2 weeks.   2. Wound of right breast, initial encounter Improving.  3. H/O mitral valve replacement with mechanical valve      HPI, Exam, and A&P Transcribed under the direction and in the presence of Venessa Wickham L. Wendelyn Breslow, MD  Electronically Signed: Silvio Pate, CMA  I have done the exam and reviewed the above chart and it is accurate to the best of my knowledge. Dentist has been used in this note in any air is in the dictation or transcription are unintentional.  Megan Mans, MD  [facility]

## 2017-12-26 ENCOUNTER — Ambulatory Visit: Payer: BC Managed Care – PPO | Admitting: Family Medicine

## 2017-12-27 ENCOUNTER — Ambulatory Visit: Payer: No Typology Code available for payment source

## 2018-01-03 ENCOUNTER — Encounter: Payer: BC Managed Care – PPO | Attending: Family Medicine | Admitting: *Deleted

## 2018-01-03 ENCOUNTER — Encounter: Payer: Self-pay | Admitting: *Deleted

## 2018-01-03 VITALS — Ht 63.0 in | Wt 249.9 lb

## 2018-01-03 DIAGNOSIS — Z79899 Other long term (current) drug therapy: Secondary | ICD-10-CM | POA: Insufficient documentation

## 2018-01-03 DIAGNOSIS — I10 Essential (primary) hypertension: Secondary | ICD-10-CM | POA: Diagnosis not present

## 2018-01-03 DIAGNOSIS — Z952 Presence of prosthetic heart valve: Secondary | ICD-10-CM | POA: Diagnosis not present

## 2018-01-03 DIAGNOSIS — E785 Hyperlipidemia, unspecified: Secondary | ICD-10-CM | POA: Insufficient documentation

## 2018-01-03 DIAGNOSIS — I4891 Unspecified atrial fibrillation: Secondary | ICD-10-CM | POA: Diagnosis not present

## 2018-01-03 DIAGNOSIS — G709 Myoneural disorder, unspecified: Secondary | ICD-10-CM | POA: Insufficient documentation

## 2018-01-03 DIAGNOSIS — Z7901 Long term (current) use of anticoagulants: Secondary | ICD-10-CM | POA: Insufficient documentation

## 2018-01-03 DIAGNOSIS — M069 Rheumatoid arthritis, unspecified: Secondary | ICD-10-CM | POA: Diagnosis not present

## 2018-01-03 DIAGNOSIS — Z48812 Encounter for surgical aftercare following surgery on the circulatory system: Secondary | ICD-10-CM | POA: Diagnosis present

## 2018-01-03 DIAGNOSIS — T8189XA Other complications of procedures, not elsewhere classified, initial encounter: Secondary | ICD-10-CM | POA: Insufficient documentation

## 2018-01-03 NOTE — Progress Notes (Signed)
Daily Session Note  Patient Details  Name: Victoria Davila MRN: [id number] Date of Birth: 1956/09/16 Referring Provider:     Cardiac Rehab from 01/03/2018 in [facility]  Referring Provider  [name]      Encounter Date: 01/03/2018  Check In: Session Check In - 01/03/18 1346      Check-In   Staff Present  Renita Papa, RN BSN;Amanda Oletta Darter, BA, ACSM CEP, Exercise Physiologist    Supervising physician immediately available to respond to emergencies  See telemetry face sheet for immediately available ER MD    Medication changes reported      No    Fall or balance concerns reported     No    Tobacco Cessation  No Change non smoker    Warm-up and Cool-down  Performed as group-led instruction    Resistance Training Performed  Yes    VAD Patient?  No      Pain Assessment   Currently in Pain?  No/denies        Exercise Prescription Changes - 01/03/18 1400      Response to Exercise   Blood Pressure (Admit)  118/84    Blood Pressure (Exercise)  138/88    Blood Pressure (Exit)  136/84    Heart Rate (Admit)  97 bpm    Heart Rate (Exercise)  121 bpm    Heart Rate (Exit)  107 bpm    Oxygen Saturation (Admit)  99 %    Oxygen Saturation (Exit)  95 %    Rating of Perceived Exertion (Exercise)  15       Social History   Tobacco Use  Smoking Status Never Smoker  Smokeless Tobacco Never Used    Goals Met:  Proper associated with RPD/PD & O2 Sat Exercise tolerated well Personal goals reviewed No report of cardiac concerns or symptoms Strength training completed today  Goals Unmet:  Not Applicable  Comments: Med Review completed    Dr. Emily Filbert is Medical Director for [facility].

## 2018-01-03 NOTE — Patient Instructions (Signed)
Patient Instructions  Patient Details  Name: Victoria Davila MRN: [id number] Date of Birth: 1956/09/13 Referring Provider:  Maple Davila.,*  Below are your personal goals for exercise, nutrition, and risk factors. Our goal is to help you stay on track towards obtaining and maintaining these goals. We will be discussing your progress on these goals with you throughout the program.  Initial Exercise Prescription: Initial Exercise Prescription - 01/03/18 1400      Date of Initial Exercise RX and Referring Provider   Date  01/03/18    Referring Provider  Victoria Davila      Treadmill   MPH  1    Grade  0    Minutes  15    METs  1.5      NuStep   Level  1    SPM  80    Minutes  15    METs  1.5      Biostep-RELP   Level  1    SPM  50    Minutes  15    METs  1.5      Track   Laps  11    Minutes  15    METs  1.5      Prescription Details   Frequency (times per week)  3    Duration  Progress to 30 minutes of continuous aerobic without signs/symptoms of physical distress      Intensity   THRR 40-80% of Max Heartrate  116-144    Ratings of Perceived Exertion  11-15    Perceived Dyspnea  0-4      Resistance Training   Training Prescription  Yes    Weight  2 lb    Reps  10-15       Exercise Goals: Frequency: Be able to perform aerobic exercise two to three times per week in program working toward 2-5 days per week of home exercise.  Intensity: Work with a perceived exertion of 11 (fairly light) - 15 (hard) while following your exercise prescription.  We will make changes to your prescription with you as you progress through the program.   Duration: Be able to do 30 to 45 minutes of continuous aerobic exercise in addition to a 5 minute warm-up and a 5 minute cool-down routine.   Nutrition Goals: Your personal nutrition goals will be established when you do your nutrition analysis with the dietician.  The following are general nutrition guidelines to  follow: Cholesterol < 200mg /day Sodium < 1500mg /day Fiber: Women over 50 yrs - 21 grams per day  Personal Goals: Personal Goals and Risk Factors at Admission - 01/03/18 1352      Core Components/Risk Factors/Patient Goals on Admission    Weight Management  Yes;Obesity;Weight Loss    Intervention  Weight Management: Develop a combined nutrition and exercise program designed to reach desired caloric intake, while maintaining appropriate intake of nutrient and fiber, sodium and fats, and appropriate energy expenditure required for the weight goal.;Weight Management: Provide education and appropriate resources to help participant work on and attain dietary goals.;Weight Management/Obesity: Establish reasonable short term and long term weight goals.;Obesity: Provide education and appropriate resources to help participant work on and attain dietary goals.    Admit Weight  250 lb (113.4 kg)    Goal Weight: Short Term  245 lb (111.1 kg)    Goal Weight: Long Term  200 lb (90.7 kg)    Expected Outcomes  Short Term: Continue to assess and modify interventions until short  term weight is achieved;Long Term: Adherence to nutrition and physical activity/exercise program aimed toward attainment of established weight goal    Heart Failure  Yes    Intervention  Provide a combined exercise and nutrition program that is supplemented with education, support and counseling about heart failure. Directed toward relieving symptoms such as shortness of breath, decreased exercise tolerance, and extremity edema.    Expected Outcomes  Improve functional capacity of life;Short term: Attendance in program 2-3 days a week with increased exercise capacity. Reported lower sodium intake. Reported increased fruit and vegetable intake. Reports medication compliance.;Short term: Daily weights obtained and reported for increase. Utilizing diuretic protocols set by physician.;Long term: Adoption of self-care skills and reduction of  barriers for early signs and symptoms recognition and intervention leading to self-care maintenance.    Hypertension  Yes    Intervention  Provide education on lifestyle modifcations including regular physical activity/exercise, weight management, moderate sodium restriction and increased consumption of fresh fruit, vegetables, and low fat dairy, alcohol moderation, and smoking cessation.;Monitor prescription use compliance.    Expected Outcomes  Short Term: Continued assessment and intervention until BP is < 140/97mm HG in hypertensive participants. < 130/47mm HG in hypertensive participants with diabetes, heart failure or chronic kidney disease.;Long Term: Maintenance of blood pressure at goal levels.    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 Teach and support self-help strategies.;Refer participants experiencing significant psychosocial distress to appropriate mental health specialists for further evaluation and treatment. When possible, include family members and significant others in education/counseling sessions.    Expected Outcomes  Long Term: Emotional wellbeing is indicated by absence of clinically significant psychosocial distress or social isolation.;Short Term: Participant demonstrates changes in health-related behavior, relaxation and other stress management skills, ability to obtain effective social support, and compliance with psychotropic medications if prescribed.       Tobacco Use Initial Evaluation: Social History   Tobacco Use  Smoking Status Never Smoker  Smokeless Tobacco Never Used    Exercise Goals and Review: Exercise Goals    Row Name 01/03/18 1419             Exercise Goals   Increase Physical Activity  Yes       Intervention  Provide advice, education, support  and counseling about physical activity/exercise needs.;Develop an individualized exercise prescription for aerobic and resistive training based on initial evaluation findings, risk stratification, comorbidities and participant's personal goals.       Expected Outcomes  Short Term: Attend rehab on a regular basis to increase amount of physical activity.;Long Term: Add in home exercise to make exercise part of routine and to increase amount of physical activity.;Long Term: Exercising regularly at least 3-5 days a week.       Increase Strength and Stamina  Yes       Intervention  Provide advice, education, support and counseling about physical activity/exercise needs.;Develop an individualized exercise prescription for aerobic and resistive training based on initial evaluation findings, risk stratification, comorbidities and participant's personal goals.       Expected Outcomes  Short Term: Increase workloads from initial exercise prescription for resistance, speed, and METs.;Short Term: Perform resistance training exercises routinely during rehab and add in resistance training at home;Long Term: Improve cardiorespiratory fitness, muscular endurance and strength as measured by increased METs  and functional capacity ( )       Able to understand and use rate of perceived exertion (RPE) scale  Yes       Intervention  Provide education and explanation on how to use RPE scale       Expected Outcomes  Short Term: Able to use RPE daily in rehab to express subjective intensity level;Long Term:  Able to use RPE to guide intensity level when exercising independently       Able to understand and use Dyspnea scale  Yes       Intervention  Provide education and explanation on how to use Dyspnea scale       Expected Outcomes  Short Term: Able to use Dyspnea scale daily in rehab to express subjective sense of shortness of breath during exertion;Long Term: Able to use Dyspnea scale to guide intensity level when exercising  independently       Knowledge and understanding of Target Heart Rate Range (THRR)  Yes       Intervention  Provide education and explanation of THRR including how the numbers were predicted and where they are located for reference       Expected Outcomes  Short Term: Able to state/look up THRR;Long Term: Able to use THRR to govern intensity when exercising independently;Short Term: Able to use daily as guideline for intensity in rehab       Able to check pulse independently  Yes       Intervention  Provide education and demonstration on how to check pulse in carotid and radial arteries.;Review the importance of being able to check your own pulse for safety during independent exercise       Expected Outcomes  Short Term: Able to explain why pulse checking is important during independent exercise;Long Term: Able to check pulse independently and accurately       Understanding of Exercise Prescription  Yes       Intervention  Provide education, explanation, and written materials on patient's individual exercise prescription       Expected Outcomes  Short Term: Able to explain program exercise prescription;Long Term: Able to explain home exercise prescription to exercise independently          Copy of goals given to participant.

## 2018-01-03 NOTE — Progress Notes (Signed)
Cardiac Individual Treatment Plan  Patient Details  Name: Victoria Davila MRN: [id number] Date of Birth: 04-02-1956 Referring Provider:     Cardiac Rehab from 01/03/2018 in [facility] Cardiac and Pulmonary Rehab  Referring Provider  [name]      Initial Encounter Date:    Cardiac Rehab from 01/03/2018 in [facility] Cardiac and Pulmonary Rehab  Date  01/03/18  Referring Provider  [name]      Visit Diagnosis: S/P mitral valve replacement  Patient's Home Medications on Admission:  Current Outpatient Medications:  .  acetaminophen (TYLENOL) 500 MG tablet, Take 1,000 mg by mouth every 6 (six) hours as needed for mild pain or moderate pain., Disp: , Rfl:  .  aspirin 81 MG chewable tablet, Chew 81 mg by mouth daily., Disp: , Rfl:  .  DILANTIN 100 MG ER capsule, Take 4 capsules (400 mg total) by mouth daily., Disp: 360 capsule, Rfl: 3 .  folic acid (FOLVITE) 1 MG tablet, Take 1 mg by mouth daily., Disp: , Rfl:  .  gabapentin (NEURONTIN) 100 MG capsule, Take 100 mg by mouth 3 (three) times daily., Disp: , Rfl:  .  metoprolol succinate (TOPROL-XL) 25 MG 24 hr tablet, Take 25 mg by mouth daily., Disp: , Rfl:  .  Multiple Vitamin (MULTIVITAMIN WITH MINERALS) TABS tablet, Take 1 tablet by mouth at bedtime., Disp: , Rfl:  .  Omega-3 Fatty Acids (FISH OIL) 1200 MG CAPS, Take 1 capsule by mouth 2 (two) times daily. , Disp: , Rfl:  .  potassium chloride (K-DUR) 10 MEQ tablet, Take 1 tablet (10 mEq total) by mouth at bedtime. (Patient taking differently: Take 30 mEq by mouth 2 (two) times daily. ), Disp: 90 tablet, Rfl: 3 .  psyllium (REGULOID) 0.52 G capsule, Take 0.52 g by mouth 2 (two) times daily., Disp: , Rfl:  .  torsemide (DEMADEX) 20 MG tablet, Take 1 tablet (20 mg total) by mouth daily. (Patient taking differently: Take 40 mg by mouth 2 (two) times daily. ), Disp: 30 tablet, Rfl: 5 .  valACYclovir (VALTREX) 1000 MG tablet, Take 1 tablet (1,000 mg total) by mouth 2 (two) times daily. For one day., Disp:  60 tablet, Rfl: 5 .  warfarin (COUMADIN) 6 MG tablet, Take 1 tablet (6 mg total) by mouth daily., Disp: 30 tablet, Rfl: 12 .  warfarin (COUMADIN) 3 MG tablet, Take 2 tablets (6 mg total) by mouth daily., Disp: 60 tablet, Rfl: 5 .  warfarin (COUMADIN) 4 MG tablet, TAKE 1 TABLET EVERY DAY WITH A '3MG'$  TABLET, Disp: 30 tablet, Rfl: 5 .  warfarin (COUMADIN) 5 MG tablet, TAKE 1 TABLET BY MOUTH DAILY, Disp: 30 tablet, Rfl: 3  Past Medical History: Past Medical History:  Diagnosis Date  . Dysrhythmia    A FIB / FOLLOWED BY DR [place]  . Edema of both legs    CHRONIC  . Hyperlipidemia   . Hypertension   . Mitral valve disorder    "deformed"  . Neuromuscular disorder ([place])   . Rheumatoid arthritis ([place])    RECENT DX - HAS NOT YET SEEN RHEUMATOLOGIST  . Seizures ([place])    LAST SEIZURE JAN 1995  . Shortness of breath dyspnea    WITH EXERTION    Tobacco Use: Social History   Tobacco Use  Smoking Status Never Smoker  Smokeless Tobacco Never Used    Labs: Recent Review Scientist, physiological    Labs for ITP Cardiac and Pulmonary Rehab Latest Ref Rng & Units 09/28/2014  03/01/2016 05/25/2017 10/05/2017   Cholestrol <200 mg/dL - 287(H) 266(H) 259(H)   LDLCALC 0 - 99 mg/dL - 183(H) 154(H) -   HDL >50 mg/dL - 68 80 68   Trlycerides <150 mg/dL - 178(H) 161(H) 155(H)   Hemoglobin A1c 4.8 - 5.6 % 5.7 5.6 - -       Exercise Target Goals: Date: 01/03/18  Exercise Program Goal: Individual exercise prescription set using results from initial 6 min walk test and THRR while considering  patient's activity barriers and safety.   Exercise Prescription Goal: Initial exercise prescription builds to 30-45 minutes a day of aerobic activity, 2-3 days per week.  Home exercise guidelines will be given to patient during program as part of exercise prescription that the participant will acknowledge.  Activity Barriers & Risk Stratification: Activity Barriers & Cardiac Risk Stratification - 01/03/18  1508      Activity Barriers & Cardiac Risk Stratification   Activity Barriers  Arthritis;Back Problems;Joint Problems;Left Knee Replacement;Incisional Pain;Shortness of Breath    Cardiac Risk Stratification  Moderate       6 Minute Walk: 6 Minute Walk    Row Name 01/03/18 1420         6 Minute Walk   Distance  600 feet     Walk Time  4.5 minutes     # of Rest Breaks  2     MPH  1.5     METS  1.5     RPE  15     Perceived Dyspnea   2     VO2 Peak  5.12     Symptoms  Yes (comment)     Comments  leg tightness (quads) 5/10 - afraid of falling      Resting HR  88 bpm     Resting BP  118/84     Resting Oxygen Saturation   99 %     Exercise Oxygen Saturation  during 6 min walk  95 %     Max Ex. HR  123 bpm     Max Ex. BP  138/88     2 Minute Post BP  136/84        Oxygen Initial Assessment:   Oxygen Re-Evaluation:   Oxygen Discharge (Final Oxygen Re-Evaluation):   Initial Exercise Prescription: Initial Exercise Prescription - 01/03/18 1400      Date of Initial Exercise RX and Referring Provider   Date  01/03/18    Referring Provider  [name]      Treadmill   MPH  1    Grade  0    Minutes  15    METs  1.5      NuStep   Level  1    SPM  80    Minutes  15    METs  1.5      Biostep-RELP   Level  1    SPM  50    Minutes  15    METs  1.5      Track   Laps  11    Minutes  15    METs  1.5      Prescription Details   Frequency (times per week)  3    Duration  Progress to 30 minutes of continuous aerobic without signs/symptoms of physical distress      Intensity   THRR 40-80% of Max Heartrate  116-144    Ratings of Perceived Exertion  11-15    Perceived Dyspnea  0-4  Resistance Training   Training Prescription  Yes    Weight  2 lb    Reps  10-15       Perform Capillary Blood Glucose checks as needed.  Exercise Prescription Changes: Exercise Prescription Changes    Row Name 01/03/18 1400             Response to Exercise   Blood  Pressure (Admit)  118/84       Blood Pressure (Exercise)  138/88       Blood Pressure (Exit)  136/84       Heart Rate (Admit)  97 bpm       Heart Rate (Exercise)  121 bpm       Heart Rate (Exit)  107 bpm       Oxygen Saturation (Admit)  99 %       Oxygen Saturation (Exit)  95 %       Rating of Perceived Exertion (Exercise)  15          Exercise Comments:   Exercise Goals and Review: Exercise Goals    Row Name 01/03/18 1419             Exercise Goals   Increase Physical Activity  Yes       Intervention  Provide advice, education, support and counseling about physical activity/exercise needs.;Develop an individualized exercise prescription for aerobic and resistive training based on initial evaluation findings, risk stratification, comorbidities and participant's personal goals.       Expected Outcomes  Short Term: Attend rehab on a regular basis to increase amount of physical activity.;Long Term: Add in home exercise to make exercise part of routine and to increase amount of physical activity.;Long Term: Exercising regularly at least 3-5 days a week.       Increase Strength and Stamina  Yes       Intervention  Provide advice, education, support and counseling about physical activity/exercise needs.;Develop an individualized exercise prescription for aerobic and resistive training based on initial evaluation findings, risk stratification, comorbidities and participant's personal goals.       Expected Outcomes  Short Term: Increase workloads from initial exercise prescription for resistance, speed, and METs.;Short Term: Perform resistance training exercises routinely during rehab and add in resistance training at home;Long Term: Improve cardiorespiratory fitness, muscular endurance and strength as measured by increased METs and functional capacity (6MWT)       Able to understand and use rate of perceived exertion (RPE) scale  Yes       Intervention  Provide education and explanation on  how to use RPE scale       Expected Outcomes  Short Term: Able to use RPE daily in rehab to express subjective intensity level;Long Term:  Able to use RPE to guide intensity level when exercising independently       Able to understand and use Dyspnea scale  Yes       Intervention  Provide education and explanation on how to use Dyspnea scale       Expected Outcomes  Short Term: Able to use Dyspnea scale daily in rehab to express subjective sense of shortness of breath during exertion;Long Term: Able to use Dyspnea scale to guide intensity level when exercising independently       Knowledge and understanding of Target Heart Rate Range (THRR)  Yes       Intervention  Provide education and explanation of THRR including how the numbers were predicted and where they are located for reference  Expected Outcomes  Short Term: Able to state/look up THRR;Long Term: Able to use THRR to govern intensity when exercising independently;Short Term: Able to use daily as guideline for intensity in rehab       Able to check pulse independently  Yes       Intervention  Provide education and demonstration on how to check pulse in carotid and radial arteries.;Review the importance of being able to check your own pulse for safety during independent exercise       Expected Outcomes  Short Term: Able to explain why pulse checking is important during independent exercise;Long Term: Able to check pulse independently and accurately       Understanding of Exercise Prescription  Yes       Intervention  Provide education, explanation, and written materials on patient's individual exercise prescription       Expected Outcomes  Short Term: Able to explain program exercise prescription;Long Term: Able to explain home exercise prescription to exercise independently          Exercise Goals Re-Evaluation :   Discharge Exercise Prescription (Final Exercise Prescription Changes): Exercise Prescription Changes - 01/03/18 1400       Response to Exercise   Blood Pressure (Admit)  118/84    Blood Pressure (Exercise)  138/88    Blood Pressure (Exit)  136/84    Heart Rate (Admit)  97 bpm    Heart Rate (Exercise)  121 bpm    Heart Rate (Exit)  107 bpm    Oxygen Saturation (Admit)  99 %    Oxygen Saturation (Exit)  95 %    Rating of Perceived Exertion (Exercise)  15       Nutrition:  Target Goals: Understanding of nutrition guidelines, daily intake of sodium '1500mg'$ , cholesterol '200mg'$ , calories 30% from fat and 7% or less from saturated fats, daily to have 5 or more servings of fruits and vegetables.  Biometrics: Pre Biometrics - 01/03/18 1419      Pre Biometrics   Height  '5\' 3"'$  (1.6 m)    Weight  249 lb 14.4 oz (113.4 kg)    Waist Circumference  43 inches    Hip Circumference  57 inches    Waist to Hip Ratio  0.75 %    BMI (Calculated)  44.28        Nutrition Therapy Plan and Nutrition Goals: Nutrition Therapy & Goals - 01/03/18 1513      Intervention Plan   Intervention  Prescribe, educate and counsel regarding individualized specific dietary modifications aiming towards targeted core components such as weight, hypertension, lipid management, diabetes, heart failure and other comorbidities.;Nutrition handout(s) given to patient.    Expected Outcomes  Short Term Goal: Understand basic principles of dietary content, such as calories, fat, sodium, cholesterol and nutrients.;Short Term Goal: A plan has been developed with personal nutrition goals set during dietitian appointment.;Long Term Goal: Adherence to prescribed nutrition plan.       Nutrition Assessments: Nutrition Assessments - 01/03/18 1353      MEDFICTS Scores   Pre Score  15       Nutrition Goals Re-Evaluation:   Nutrition Goals Discharge (Final Nutrition Goals Re-Evaluation):   Psychosocial: Target Goals: Acknowledge presence or absence of significant depression and/or stress, maximize coping skills, provide positive support system.  Participant is able to verbalize types and ability to use techniques and skills needed for reducing stress and depression.   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  Yes husband      Screening Interventions   Interventions  Encouraged to exercise;Program counselor consult;To provide support and resources with identified psychosocial needs;Provide feedback about the scores to participant    Expected Outcomes  Short Term goal: Utilizing psychosocial counselor, staff and physician to assist with identification of specific Stressors or current issues interfering with healing process. Setting desired goal for each stressor or current issue identified.;Long Term Goal: Stressors or current issues are controlled or eliminated.;Long Term goal: The participant improves quality of Life and PHQ9 Scores as seen by post scores and/or verbalization of changes;Short Term goal: Identification and review with participant of any Quality of Life or Depression concerns found by scoring the questionnaire.       Quality of Life Scores:  Quality of Life - 01/03/18 1509      Quality of Life Scores   Health/Function Pre  20.03 %    Socioeconomic Pre  26.5 %    Psych/Spiritual Pre  26.79 %    Family Pre  28.8 %    GLOBAL Pre  24.04 %      Scores of 19 and below usually indicate a poorer quality of life in these areas.  A difference of  2-3 points is a clinically meaningful difference.  A difference of 2-3 points in the total score of the Quality of Life Index has been associated with significant improvement in overall quality of life, self-image, physical symptoms, and general health in studies assessing change in  quality of life.  PHQ-9: Recent Review Flowsheet Data    Depression screen [facility] 2/9 01/03/2018 04/25/2017 02/29/2016   Decreased Interest 0 0 0   Down, Depressed, Hopeless 0 0 0   PHQ - 2 Score 0 0 0   Altered sleeping 0 0 -   Tired, decreased energy 3 0 -   Change in appetite 0 0 -   Feeling bad or failure about yourself  0 0 -   Trouble concentrating 0 0 -   Moving slowly or fidgety/restless 0 0 -   Suicidal thoughts 0 0 -   PHQ-9 Score 3 0 -   Difficult doing work/chores Not difficult at all - -     Interpretation of Total Score  Total Score Depression Severity:  1-4 = Minimal depression, 5-9 = Mild depression, 10-14 = Moderate depression, 15-19 = Moderately severe depression, 20-27 = Severe depression   Psychosocial Evaluation and Intervention:   Psychosocial Re-Evaluation:   Psychosocial Discharge (Final Psychosocial Re-Evaluation):   Vocational Rehabilitation: Provide vocational rehab assistance to qualifying candidates.   Vocational Rehab Evaluation & Intervention: Vocational Rehab - 01/03/18 1511      Initial Vocational Rehab Evaluation & Intervention   Assessment shows need for Vocational Rehabilitation  No       Education: Education Goals: Education classes will be provided on a variety of topics geared toward better understanding of heart health and risk factor modification. Participant will state understanding/return demonstration of topics presented as noted by education test scores.  Learning Barriers/Preferences: Learning Barriers/Preferences - 01/03/18 1513      Learning Barriers/Preferences   Learning Barriers  None    Learning Preferences  Individual Instruction       Education Topics:  AED/CPR: - Group verbal and written instruction with the use of models to demonstrate the basic use of the AED with the basic ABC's of resuscitation.   General Nutrition Guidelines/Fats and Fiber: -Group instruction provided by verbal, written material, models  and posters to present the general guidelines for heart healthy nutrition. Gives an explanation and review of dietary fats and fiber.   Controlling Sodium/Reading Food Labels: -Group verbal and written material supporting the discussion of sodium use in heart healthy nutrition. Review and explanation with models, verbal and written materials for utilization of the food label.   Exercise Physiology & General Exercise Guidelines: - Group verbal and written instruction with models to review the exercise physiology of the cardiovascular system and associated critical values. Provides general exercise guidelines with specific guidelines to those with heart or lung disease.    Aerobic Exercise & Resistance Training: - Gives group verbal and written instruction on the various components of exercise. Focuses on aerobic and resistive training programs and the benefits of this training and how to safely progress through these programs..   Flexibility, Balance, Mind/Body Relaxation: Provides group verbal/written instruction on the benefits of flexibility and balance training, including mind/body exercise modes such as yoga, pilates and tai chi.  Demonstration and skill practice provided.   Stress and Anxiety: - Provides group verbal and written instruction about the health risks of elevated stress and causes of high stress.  Discuss the correlation between heart/lung disease and anxiety and treatment options. Review healthy ways to manage with stress and anxiety.   Depression: - Provides group verbal and written instruction on the correlation between heart/lung disease and depressed mood, treatment options, and the stigmas associated with seeking treatment.   Anatomy & Physiology of the Heart: - Group verbal and written instruction and models provide basic cardiac anatomy and physiology, with the coronary electrical and arterial systems. Review of Valvular disease and Heart Failure   Cardiac  Procedures: - Group verbal and written instruction to review commonly prescribed medications for heart disease. Reviews the medication, class of the drug, and side effects. Includes the steps to properly store meds and maintain the prescription regimen. (beta blockers and nitrates)   Cardiac Medications I: - Group verbal and written instruction to review commonly prescribed medications for heart disease. Reviews the medication, class of the drug, and side effects. Includes the steps to properly store meds and maintain the prescription regimen.   Cardiac Medications II: -Group verbal and written instruction to review commonly prescribed medications for heart disease. Reviews the medication, class of the drug, and side effects. (all other drug classes)    Go Sex-Intimacy & Heart Disease, Get SMART - Goal Setting: - Group verbal and written instruction through game format to discuss heart disease and the return to sexual intimacy. Provides group verbal and written material to discuss and apply goal setting through the application of the S.M.A.R.T. Method.   Other Matters of the Heart: - Provides group verbal, written materials and models to describe Stable Angina and Peripheral Artery. Includes description of the disease process and treatment options available to the cardiac patient.   Exercise & Equipment Safety: - Individual verbal instruction and demonstration of equipment use and safety with use of the equipment.   Cardiac Rehab from 01/03/2018 in [facility] Cardiac and Pulmonary Rehab  Date  01/03/18  Educator  [facility]  Instruction Review Code  1- Verbalizes Understanding      Infection  Prevention: - Provides verbal and written material to individual with discussion of infection control including proper hand washing and proper equipment cleaning during exercise session.   Cardiac Rehab from 01/03/2018 in [facility] Cardiac and Pulmonary Rehab  Date  01/03/18  Educator  [facility]  Instruction Review Code  1-  Verbalizes Understanding      Falls Prevention: - Provides verbal and written material to individual with discussion of falls prevention and safety.   Cardiac Rehab from 01/03/2018 in [facility] Cardiac and Pulmonary Rehab  Date  01/03/18  Educator  [facility]  Instruction Review Code  1- Verbalizes Understanding      Diabetes: - Individual verbal and written instruction to review signs/symptoms of diabetes, desired ranges of glucose level fasting, after meals and with exercise. Acknowledge that pre and post exercise glucose checks will be done for 3 sessions at entry of program.   Know Your Numbers and Risk Factors: -Group verbal and written instruction about important numbers in your health.  Discussion of what are risk factors and how they play a role in the disease process.  Review of Cholesterol, Blood Pressure, Diabetes, and BMI and the role they play in your overall health.   Sleep Hygiene: -Provides group verbal and written instruction about how sleep can affect your health.  Define sleep hygiene, discuss sleep cycles and impact of sleep habits. Review good sleep hygiene tips.    Other: -Provides group and verbal instruction on various topics (see comments)   Knowledge Questionnaire Score: Knowledge Questionnaire Score - 01/03/18 1510      Knowledge Questionnaire Score   Pre Score  26/28       Core Components/Risk Factors/Patient Goals at Admission: Personal Goals and Risk Factors at Admission - 01/03/18 1352      Core Components/Risk Factors/Patient Goals on Admission    Weight Management  Yes;Obesity;Weight Loss    Intervention  Weight Management: Develop a combined nutrition and exercise program designed to reach desired caloric intake, while maintaining appropriate intake of nutrient and fiber, sodium and fats, and appropriate energy expenditure required for the weight goal.;Weight Management: Provide education and appropriate resources to help participant work on and attain  dietary goals.;Weight Management/Obesity: Establish reasonable short term and long term weight goals.;Obesity: Provide education and appropriate resources to help participant work on and attain dietary goals.    Admit Weight  250 lb (113.4 kg)    Goal Weight: Short Term  245 lb (111.1 kg)    Goal Weight: Long Term  200 lb (90.7 kg)    Expected Outcomes  Short Term: Continue to assess and modify interventions until short term weight is achieved;Long Term: Adherence to nutrition and physical activity/exercise program aimed toward attainment of established weight goal    Heart Failure  Yes    Intervention  Provide a combined exercise and nutrition program that is supplemented with education, support and counseling about heart failure. Directed toward relieving symptoms such as shortness of breath, decreased exercise tolerance, and extremity edema.    Expected Outcomes  Improve functional capacity of life;Short term: Attendance in program 2-3 days a week with increased exercise capacity. Reported lower sodium intake. Reported increased fruit and vegetable intake. Reports medication compliance.;Short term: Daily weights obtained and reported for increase. Utilizing diuretic protocols set by physician.;Long term: Adoption of self-care skills and reduction of barriers for early signs and symptoms recognition and intervention leading to self-care maintenance.    Hypertension  Yes    Intervention  Provide education on lifestyle modifcations including  regular physical activity/exercise, weight management, moderate sodium restriction and increased consumption of fresh fruit, vegetables, and low fat dairy, alcohol moderation, and smoking cessation.;Monitor prescription use compliance.    Expected Outcomes  Short Term: Continued assessment and intervention until BP is < 140/57m HG in hypertensive participants. < 130/885mHG in hypertensive participants with diabetes, heart failure or chronic kidney disease.;Long Term:  Maintenance of blood pressure at goal levels.    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 Teach and support self-help strategies.;Refer participants experiencing significant psychosocial distress to appropriate mental health specialists for further evaluation and treatment. When possible, include family members and significant others in education/counseling sessions.    Expected Outcomes  Long Term: Emotional wellbeing is indicated by absence of clinically significant psychosocial distress or social isolation.;Short Term: Participant demonstrates changes in health-related behavior, relaxation and other stress management skills, ability to obtain effective social support, and compliance with psychotropic medications if prescribed.       Core Components/Risk Factors/Patient Goals Review:    Core Components/Risk Factors/Patient Goals at Discharge (Final Review):    ITP Comments: ITP Comments    Row Name 01/03/18 1347           ITP Comments  Med Review completed. Initial ITP created. Diagnosis can be found in Care Everywhere 11/15/17          Comments: Initial ITP

## 2018-01-04 ENCOUNTER — Ambulatory Visit (INDEPENDENT_AMBULATORY_CARE_PROVIDER_SITE_OTHER): Payer: BC Managed Care – PPO | Admitting: *Deleted

## 2018-01-04 DIAGNOSIS — I052 Rheumatic mitral stenosis with insufficiency: Secondary | ICD-10-CM | POA: Diagnosis not present

## 2018-01-04 DIAGNOSIS — I050 Rheumatic mitral stenosis: Secondary | ICD-10-CM

## 2018-01-04 LAB — POCT INR
INR: 2.5
PT: 30.2

## 2018-01-04 NOTE — Patient Instructions (Signed)
Continue 6 mg daily. Follow up in 4 weeks.

## 2018-01-15 ENCOUNTER — Encounter: Payer: BC Managed Care – PPO | Admitting: *Deleted

## 2018-01-15 DIAGNOSIS — Z952 Presence of prosthetic heart valve: Secondary | ICD-10-CM

## 2018-01-15 DIAGNOSIS — Z48812 Encounter for surgical aftercare following surgery on the circulatory system: Secondary | ICD-10-CM | POA: Diagnosis not present

## 2018-01-15 NOTE — Progress Notes (Signed)
Daily Session Note  Patient Details  Name: Victoria Davila MRN: [id number] Date of Birth: November 27, 1955 Referring Provider:     Cardiac Rehab from 01/03/2018 in [facility]  Referring Provider  [name]      Encounter Date: 01/15/2018  Check In: Session Check In - 01/15/18 0907      Check-In   Location  [facility]-Cardiac & Pulmonary Rehab    Staff Present  Alberteen Sam, MA, RCEP, CCRP, Exercise Physiologist;Amanda Oletta Darter, BA, ACSM CEP, Exercise Physiologist;Susanne Bice, RN, BSN, CCRP    Supervising physician immediately available to respond to emergencies  See telemetry face sheet for immediately available ER MD    Medication changes reported      No    Fall or balance concerns reported     No    Warm-up and Cool-down  Performed on first and last piece of equipment    Resistance Training Performed  Yes    VAD Patient?  No      Pain Assessment   Currently in Pain?  No/denies          Social History   Tobacco Use  Smoking Status Never Smoker  Smokeless Tobacco Never Used    Goals Met:  Exercise tolerated well No report of cardiac concerns or symptoms Strength training completed today  Goals Unmet:  Not Applicable  Comments: First full day of exercise!  Patient was oriented to gym and equipment including functions, settings, policies, and procedures.  Patient's individual exercise prescription and treatment plan were reviewed.  All starting workloads were established based on the results of the 6 minute walk test done at initial orientation visit.  The plan for exercise progression was also introduced and progression will be customized based on patient's performance and goals.    Dr. Emily Filbert is Medical Director for [facility].

## 2018-01-17 DIAGNOSIS — Z952 Presence of prosthetic heart valve: Secondary | ICD-10-CM

## 2018-01-17 DIAGNOSIS — Z48812 Encounter for surgical aftercare following surgery on the circulatory system: Secondary | ICD-10-CM | POA: Diagnosis not present

## 2018-01-17 NOTE — Progress Notes (Signed)
Daily Session Note  Patient Details  Name: Victoria Davila MRN: [id number] Date of Birth: 1956/10/28 Referring Provider:     Cardiac Rehab from 01/03/2018 in [facility]  Referring Provider  [name]      Encounter Date: 01/17/2018  Check In: Session Check In - 01/17/18 0827      Check-In   Location  [facility]    Staff Present  Alberteen Sam, MA, RCEP, CCRP, Exercise Physiologist;Amanda Oletta Darter, BA, ACSM CEP, Exercise Physiologist;Carroll Enterkin, RN, BSN    Supervising physician immediately available to respond to emergencies  See telemetry face sheet for immediately available ER MD    Medication changes reported      No    Fall or balance concerns reported     No    Warm-up and Cool-down  Performed on first and last piece of equipment    Resistance Training Performed  Yes    VAD Patient?  No      Pain Assessment   Currently in Pain?  No/denies    Multiple Pain Sites  No          Social History   Tobacco Use  Smoking Status Never Smoker  Smokeless Tobacco Never Used    Goals Met:  Independence with exercise equipment Exercise tolerated well No report of cardiac concerns or symptoms Strength training completed today  Goals Unmet:  Not Applicable  Comments: Pt able to follow exercise prescription today without complaint.  Will continue to monitor for progression.    Dr. Emily Filbert is Medical Director for [facility].

## 2018-01-21 ENCOUNTER — Telehealth: Payer: Self-pay | Admitting: *Deleted

## 2018-01-21 NOTE — Telephone Encounter (Signed)
Victoria Davila's husband called to report that she has had a fever and virus over the weekend. She is not planning on attending class tomorrow (3/26).

## 2018-01-22 ENCOUNTER — Ambulatory Visit (INDEPENDENT_AMBULATORY_CARE_PROVIDER_SITE_OTHER): Payer: BC Managed Care – PPO | Admitting: Family Medicine

## 2018-01-22 ENCOUNTER — Other Ambulatory Visit: Payer: Self-pay

## 2018-01-22 VITALS — BP 130/88 | HR 90 | Temp 98.6°F | Resp 18 | Wt 247.0 lb

## 2018-01-22 DIAGNOSIS — I050 Rheumatic mitral stenosis: Secondary | ICD-10-CM | POA: Diagnosis not present

## 2018-01-22 DIAGNOSIS — I052 Rheumatic mitral stenosis with insufficiency: Secondary | ICD-10-CM | POA: Diagnosis not present

## 2018-01-22 DIAGNOSIS — D649 Anemia, unspecified: Secondary | ICD-10-CM

## 2018-01-22 DIAGNOSIS — R0602 Shortness of breath: Secondary | ICD-10-CM

## 2018-01-22 DIAGNOSIS — Z952 Presence of prosthetic heart valve: Secondary | ICD-10-CM | POA: Diagnosis not present

## 2018-01-22 DIAGNOSIS — N390 Urinary tract infection, site not specified: Secondary | ICD-10-CM

## 2018-01-22 DIAGNOSIS — E875 Hyperkalemia: Secondary | ICD-10-CM

## 2018-01-22 DIAGNOSIS — I10 Essential (primary) hypertension: Secondary | ICD-10-CM | POA: Diagnosis not present

## 2018-01-22 NOTE — Progress Notes (Signed)
Victoria Davila  MRN: [id number] DOB: Sep 15, 1956  Subjective:  HPI   The patient is a 62 year old female that is 2 1/2 months post op from cardiac surgery at [facility].  Since her surgery she has had episodes of fever and fatigue. This current one started 5 days ago.  She was seen by the surgeon one time and her cardiologist one time.  She has had cardiac echo, blood work and blood cultures, EKG and chest x ray.  Patient reports that everything has been negative.  Neither have given her any cause for the fever.  In January when she had the first episode she was given Ceftin and she took one round and started to improve.  She started the second round and had GI upset and the medicine was stopped.  This episode started after she had been to cardiac rehab.  She was leaving the hospital and when she would take a deep breath she noticed pain in the roof of her mouth.  She then had some tightness in her chest, no pain just tight.  She later then had fatigue and some shortness of breath.   She has been able to work since the episode.  She noticed getting more tired and out of breath when she leaves work and has to walk through the building and to the car.  However, she and her husband state that this is improved today.  Patient Active Problem List   Diagnosis Date Noted  . 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 Cervical spine arthritis 11/09/2017  . Chronic diastolic heart failure, NYHA class 2 (HCC) 08/30/2017  . Chronic bilateral low back pain 10/13/2016  . Recurrent oral herpes simplex 04/30/2015  . Arthritis 04/15/2015  . Gonalgia 04/15/2015  . Cervical polyp 04/15/2015  . Encounter for screening for diabetes mellitus 04/15/2015  . Edema 04/15/2015  . Abnormal liver enzymes 04/15/2015  . Essential (primary) hypertension 04/15/2015  . H/O head injury 04/15/2015  . Hypercholesteremia 04/15/2015  . Mitral regurgitation and mitral stenosis 04/15/2015  . Adiposity 04/15/2015  . AF  (paroxysmal atrial fibrillation) (HCC) 04/15/2015  . Bundle branch block, right 04/15/2015  . Cerebral seizure 04/15/2015  . 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 Urge incontinence 04/15/2015  . Avitaminosis D 04/15/2015  . Arthritis of right hip 11/06/2014  . 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 Breath shortness 07/28/2014    Past Medical History:  Diagnosis Date  . Dysrhythmia    A FIB / FOLLOWED BY DR Nolon Rod AT [facility]  . Edema of both legs    CHRONIC  . Hyperlipidemia   . Hypertension   . Mitral valve disorder    "deformed"  . Neuromuscular disorder (HCC)   . Rheumatoid arthritis (HCC)    RECENT DX - HAS NOT YET SEEN RHEUMATOLOGIST  . Seizures (HCC)    LAST SEIZURE JAN 1995  . Shortness of breath dyspnea    WITH EXERTION    Social History   Socioeconomic History  . Marital status: Married    Spouse name: Not on file  . Number of children: Not on file  . Years of education: Not on file  . Highest education level: Not on file  Occupational History  . Not on file  Social Needs  . Financial resource strain: Not on file  . Food insecurity:    Worry: Not on file    Inability: Not on file  . Transportation needs:    Medical:  Not on file    Non-medical: Not on file  Tobacco Use  . Smoking status: Never Smoker  . Smokeless tobacco: Never Used  Substance and Sexual Activity  . Alcohol use: No    Comment: OCCASIONAL WINE ONLY  . Drug use: No  . Sexual activity: Not on file  Lifestyle  . Physical activity:    Days per week: Not on file    Minutes per session: Not on file  . Stress: Not on file  Relationships  . Social connections:    Talks on phone: Not on file    Gets together: Not on file    Attends religious service: Not on file    Active member of club or organization: Not on file    Attends meetings of clubs or organizations: Not on file    Relationship status: Not on file  . Intimate partner violence:    Fear of current or ex  partner: Not on file    Emotionally abused: Not on file    Physically abused: Not on file    Forced sexual activity: Not on file  Other Topics Concern  . Not on file  Social History Narrative  . Not on file    Outpatient Encounter Medications as of 01/22/2018  Medication Sig Note  . acetaminophen (TYLENOL) 500 MG tablet Take 1,000 mg by mouth every 6 (six) hours as needed for mild pain or moderate pain.   Marland Kitchen aspirin 81 MG chewable tablet Chew 81 mg by mouth daily.   Marland Kitchen DILANTIN 100 MG ER capsule Take 4 capsules (400 mg total) by mouth daily.   . folic acid (FOLVITE) 1 MG tablet Take 1 mg by mouth daily.   Marland Kitchen gabapentin (NEURONTIN) 100 MG capsule Take 100 mg by mouth 3 (three) times daily.   . metoprolol succinate (TOPROL-XL) 25 MG 24 hr tablet Take 25 mg by mouth daily.   . Multiple Vitamin (MULTIVITAMIN WITH MINERALS) TABS tablet Take 1 tablet by mouth at bedtime.   . Omega-3 Fatty Acids (FISH OIL) 1200 MG CAPS Take 1 capsule by mouth 2 (two) times daily.  04/13/2015: Received from: [employer]  . potassium chloride (K-DUR) 10 MEQ tablet Take 1 tablet (10 mEq total) by mouth at bedtime. (Patient taking differently: Take 30 mEq by mouth 2 (two) times daily. )   . psyllium (REGULOID) 0.52 G capsule Take 0.52 g by mouth 2 (two) times daily.   Marland Kitchen torsemide (DEMADEX) 20 MG tablet Take 1 tablet (20 mg total) by mouth daily. (Patient taking differently: Take 40 mg by mouth 2 (two) times daily. )   . valACYclovir (VALTREX) 1000 MG tablet Take 1 tablet (1,000 mg total) by mouth 2 (two) times daily. For one day.   . warfarin (COUMADIN) 3 MG tablet Take 2 tablets (6 mg total) by mouth daily.   Marland Kitchen warfarin (COUMADIN) 4 MG tablet TAKE 1 TABLET EVERY DAY WITH A 3MG  TABLET   . warfarin (COUMADIN) 5 MG tablet TAKE 1 TABLET BY MOUTH DAILY   . warfarin (COUMADIN) 6 MG tablet Take 1 tablet (6 mg total) by mouth daily.    No facility-administered encounter medications on file as of 01/22/2018.       No Known Allergies  Review of Systems  Constitutional: Positive for fever and malaise/fatigue.  HENT: Positive for congestion (head) and sinus pain. Negative for ear discharge, ear pain, hearing loss, nosebleeds, sore throat and tinnitus.        Runny nose-clear.  She has also had fever blister.  Eyes: Negative.   Respiratory: Positive for cough (dry), shortness of breath and wheezing.   Cardiovascular: Positive for leg swelling. Negative for chest pain, palpitations and orthopnea.  Skin: Negative.   Neurological: Negative.   Endo/Heme/Allergies: Negative.   Psychiatric/Behavioral: Negative.     Objective:  BP 130/88 (BP Location: Right Arm, Patient Position: Sitting, Cuff Size: Large)   Pulse 90   Temp 98.6 F (37 C) (Oral)   Resp 18   Wt 247 lb (112 kg)   SpO2 99%   BMI 43.75 kg/m   Physical Exam  Constitutional: She is oriented to person, place, and time and well-developed, well-nourished, and in no distress.  HENT:  Head: Normocephalic and atraumatic.  Right Ear: External ear normal.  Left Ear: External ear normal.  Nose: Nose normal.  Mouth/Throat: Oropharynx is clear and moist.  Eyes: Conjunctivae are normal. No scleral icterus.  Neck: No thyromegaly present.  Cardiovascular: Normal rate, regular rhythm and normal heart sounds.  Pulmonary/Chest: Effort normal and breath sounds normal.  Abdominal: Soft.  Neurological: She is alert and oriented to person, place, and time. Gait normal. GCS score is 15.  Skin: Skin is warm and dry.  Psychiatric: Mood, memory, affect and judgment normal.    Assessment and Plan :  1. Shortness of breath Multifactorial. - EKG 12-Lead - TSH - Sedimentation rate  2. Mitral valve stenosis, unspecified etiology   3. Mitral stenosis with insufficiency, unspecified etiology   4. Essential (primary) hypertension   5. Anemia, unspecified type  - CBC with Differential/Platelet - TSH  6. Mechanical heart valve present  -  INR/PT  7. High potassium  - Renal function panel  8. Urinary tract infection without hematuria, site unspecified  - Urine Culture; Future - Urine Culture  I have done the exam and reviewed the chart and it is accurate to the best of my knowledge. Dentist has been used and  any errors in dictation or transcription are unintentional. Julieanne Manson M.D. [facility]

## 2018-01-23 ENCOUNTER — Encounter: Payer: Self-pay | Admitting: *Deleted

## 2018-01-23 DIAGNOSIS — Z952 Presence of prosthetic heart valve: Secondary | ICD-10-CM

## 2018-01-23 LAB — RENAL FUNCTION PANEL
ALBUMIN: 3.9 g/dL (ref 3.6–4.8)
BUN/Creatinine Ratio: 14 (ref 12–28)
BUN: 10 mg/dL (ref 8–27)
CHLORIDE: 98 mmol/L (ref 96–106)
CO2: 23 mmol/L (ref 20–29)
CREATININE: 0.74 mg/dL (ref 0.57–1.00)
Calcium: 9.4 mg/dL (ref 8.7–10.3)
GFR calc non Af Amer: 87 mL/min/{1.73_m2} (ref >59)
GFR, EST AFRICAN AMERICAN: 100 mL/min/{1.73_m2} (ref >59)
Glucose: 96 mg/dL (ref 65–99)
Phosphorus: 3.8 mg/dL (ref 2.5–4.5)
Potassium: 3.8 mmol/L (ref 3.5–5.2)
Sodium: 142 mmol/L (ref 134–144)

## 2018-01-23 LAB — SEDIMENTATION RATE: SED RATE: 65 mm/h — AB (ref 0–40)

## 2018-01-23 LAB — CBC WITH DIFFERENTIAL/PLATELET
BASOS ABS: 0 10*3/uL (ref 0.0–0.2)
Basos: 0 %
EOS (ABSOLUTE): 0.1 10*3/uL (ref 0.0–0.4)
Eos: 2 %
HEMATOCRIT: 38.2 % (ref 34.0–46.6)
Hemoglobin: 12.9 g/dL (ref 11.1–15.9)
IMMATURE GRANULOCYTES: 0 %
Immature Grans (Abs): 0 10*3/uL (ref 0.0–0.1)
LYMPHS ABS: 1.9 10*3/uL (ref 0.7–3.1)
Lymphs: 26 %
MCH: 30.1 pg (ref 26.6–33.0)
MCHC: 33.8 g/dL (ref 31.5–35.7)
MCV: 89 fL (ref 79–97)
MONOS ABS: 0.7 10*3/uL (ref 0.1–0.9)
Monocytes: 9 %
NEUTROS PCT: 63 %
Neutrophils Absolute: 4.7 10*3/uL (ref 1.4–7.0)
PLATELETS: 260 10*3/uL (ref 150–379)
RBC: 4.28 x10E6/uL (ref 3.77–5.28)
RDW: 15.1 % (ref 12.3–15.4)
WBC: 7.4 10*3/uL (ref 3.4–10.8)

## 2018-01-23 LAB — URINE CULTURE

## 2018-01-23 LAB — PROTIME-INR
INR: 4.3 — AB (ref 0.8–1.2)
Prothrombin Time: 41.1 s — ABNORMAL HIGH (ref 9.1–12.0)

## 2018-01-23 LAB — TSH: TSH: 3.32 u[IU]/mL (ref 0.450–4.500)

## 2018-01-23 NOTE — Progress Notes (Signed)
Cardiac Individual Treatment Plan  Patient Details  Name: Victoria Davila MRN: [id number] Date of Birth: 08/28/1956 Referring Provider:     Cardiac Rehab from 01/03/2018 in [facility] [place] Cardiac and Pulmonary Rehab  Referring Provider  [name]      Initial Encounter Date:    Cardiac Rehab from 01/03/2018 in [facility] Cardiac and Pulmonary Rehab  Date  01/03/18  Referring Provider  [name]      Visit Diagnosis: S/P mitral valve replacement  Patient's Home Medications on Admission:  Current Outpatient Medications:  .  acetaminophen (TYLENOL) 500 MG tablet, Take 1,000 mg by mouth every 6 (six) hours as needed for mild pain or moderate pain., Disp: , Rfl:  .  aspirin 81 MG chewable tablet, Chew 81 mg by mouth daily., Disp: , Rfl:  .  DILANTIN 100 MG ER capsule, Take 4 capsules (400 mg total) by mouth daily., Disp: 360 capsule, Rfl: 3 .  folic acid (FOLVITE) 1 MG tablet, Take 1 mg by mouth daily., Disp: , Rfl:  .  gabapentin (NEURONTIN) 100 MG capsule, Take 100 mg by mouth 3 (three) times daily., Disp: , Rfl:  .  metoprolol succinate (TOPROL-XL) 25 MG 24 hr tablet, Take 25 mg by mouth daily., Disp: , Rfl:  .  Multiple Vitamin (MULTIVITAMIN WITH MINERALS) TABS tablet, Take 1 tablet by mouth at bedtime., Disp: , Rfl:  .  Omega-3 Fatty Acids (FISH OIL) 1200 MG CAPS, Take 1 capsule by mouth 2 (two) times daily. , Disp: , Rfl:  .  potassium chloride (K-DUR) 10 MEQ tablet, Take 1 tablet (10 mEq total) by mouth at bedtime. (Patient taking differently: Take 30 mEq by mouth 2 (two) times daily. ), Disp: 90 tablet, Rfl: 3 .  psyllium (REGULOID) 0.52 G capsule, Take 0.52 g by mouth 2 (two) times daily., Disp: , Rfl:  .  torsemide (DEMADEX) 20 MG tablet, Take 1 tablet (20 mg total) by mouth daily. (Patient taking differently: Take 40 mg by mouth 2 (two) times daily. ), Disp: 30 tablet, Rfl: 5 .  valACYclovir (VALTREX) 1000 MG tablet, Take 1 tablet (1,000 mg total) by mouth 2 (two) times daily. For one day., Disp:  60 tablet, Rfl: 5 .  warfarin (COUMADIN) 3 MG tablet, Take 2 tablets (6 mg total) by mouth daily., Disp: 60 tablet, Rfl: 5 .  warfarin (COUMADIN) 4 MG tablet, TAKE 1 TABLET EVERY DAY WITH A '3MG'$  TABLET, Disp: 30 tablet, Rfl: 5 .  warfarin (COUMADIN) 5 MG tablet, TAKE 1 TABLET BY MOUTH DAILY, Disp: 30 tablet, Rfl: 3 .  warfarin (COUMADIN) 6 MG tablet, Take 1 tablet (6 mg total) by mouth daily., Disp: 30 tablet, Rfl: 12  Past Medical History: Past Medical History:  Diagnosis Date  . Dysrhythmia    A FIB / FOLLOWED BY DR [place]  . Edema of both legs    CHRONIC  . Hyperlipidemia   . Hypertension   . Mitral valve disorder    "deformed"  . Neuromuscular disorder ([place])   . Rheumatoid arthritis ([place])    RECENT DX - HAS NOT YET SEEN RHEUMATOLOGIST  . Seizures ([place])    LAST SEIZURE JAN 1995  . Shortness of breath dyspnea    WITH EXERTION    Tobacco Use: Social History   Tobacco Use  Smoking Status Never Smoker  Smokeless Tobacco Never Used    Labs: Recent Review Scientist, physiological    Labs for ITP Cardiac and Pulmonary Rehab Latest Ref Rng & Units 09/28/2014  03/01/2016 05/25/2017 10/05/2017   Cholestrol <200 mg/dL - 287(H) 266(H) 259(H)   LDLCALC mg/dL (calc) - 183(H) 154(H) 161(H)   HDL >50 mg/dL - 68 80 68   Trlycerides <150 mg/dL - 178(H) 161(H) 155(H)   Hemoglobin A1c 4.8 - 5.6 % 5.7 5.6 - -       Exercise Target Goals:    Exercise Program Goal: Individual exercise prescription set using results from initial 6 min walk test and THRR while considering  patient's activity barriers and safety.   Exercise Prescription Goal: Initial exercise prescription builds to 30-45 minutes a day of aerobic activity, 2-3 days per week.  Home exercise guidelines will be given to patient during program as part of exercise prescription that the participant will acknowledge.  Activity Barriers & Risk Stratification: Activity Barriers & Cardiac Risk Stratification - 01/03/18 1508       Activity Barriers & Cardiac Risk Stratification   Activity Barriers  Arthritis;Back Problems;Joint Problems;Left Knee Replacement;Incisional Pain;Shortness of Breath    Cardiac Risk Stratification  Moderate       6 Minute Walk: 6 Minute Walk    Row Name 01/03/18 1420         6 Minute Walk   Distance  600 feet     Walk Time  4.5 minutes     # of Rest Breaks  2     MPH  1.5     METS  1.5     RPE  15     Perceived Dyspnea   2     VO2 Peak  5.12     Symptoms  Yes (comment)     Comments  leg tightness (quads) 5/10 - afraid of falling      Resting HR  88 bpm     Resting BP  118/84     Resting Oxygen Saturation   99 %     Exercise Oxygen Saturation  during 6 min walk  95 %     Max Ex. HR  123 bpm     Max Ex. BP  138/88     2 Minute Post BP  136/84        Oxygen Initial Assessment:   Oxygen Re-Evaluation:   Oxygen Discharge (Final Oxygen Re-Evaluation):   Initial Exercise Prescription: Initial Exercise Prescription - 01/03/18 1400      Date of Initial Exercise RX and Referring Provider   Date  01/03/18    Referring Provider  [name]      Treadmill   MPH  1    Grade  0    Minutes  15    METs  1.5      NuStep   Level  1    SPM  80    Minutes  15    METs  1.5      Biostep-RELP   Level  1    SPM  50    Minutes  15    METs  1.5      Track   Laps  11    Minutes  15    METs  1.5      Prescription Details   Frequency (times per week)  3    Duration  Progress to 30 minutes of continuous aerobic without signs/symptoms of physical distress      Intensity   THRR 40-80% of Max Heartrate  116-144    Ratings of Perceived Exertion  11-15    Perceived Dyspnea  0-4  Resistance Training   Training Prescription  Yes    Weight  2 lb    Reps  10-15       Perform Capillary Blood Glucose checks as needed.  Exercise Prescription Changes: Exercise Prescription Changes    Row Name 01/03/18 1400 01/15/18 1500           Response to Exercise   Blood  Pressure (Admit)  118/84  122/70      Blood Pressure (Exercise)  138/88  132/74      Blood Pressure (Exit)  136/84  114/60      Heart Rate (Admit)  97 bpm  77 bpm      Heart Rate (Exercise)  121 bpm  103 bpm      Heart Rate (Exit)  107 bpm  86 bpm      Oxygen Saturation (Admit)  99 %  -      Oxygen Saturation (Exit)  95 %  -      Rating of Perceived Exertion (Exercise)  15  15      Symptoms  -  fatigue      Comments  -  first full day of exercise      Duration  -  Progress to 30 minutes of  aerobic without signs/symptoms of physical distress      Intensity  -  THRR unchanged        Progression   Progression  -  Continue to progress workloads to maintain intensity without signs/symptoms of physical distress.      Average METs  -  1.79        Resistance Training   Training Prescription  -  Yes      Weight  -  2 lb      Reps  -  10-15        Interval Training   Interval Training  -  No        Treadmill   MPH  -  1      Grade  -  0      Minutes  -  1      METs  -  1.77        NuStep   Level  -  1      Minutes  -  15      METs  -  1.8         Exercise Comments: Exercise Comments    Row Name 01/15/18 0909           Exercise Comments   First full day of exercise!  Patient was oriented to gym and equipment including functions, settings, policies, and procedures.  Patient's individual exercise prescription and treatment plan were reviewed.  All starting workloads were established based on the results of the 6 minute walk test done at initial orientation visit.  The plan for exercise progression was also introduced and progression will be customized based on patient's performance and goals.          Exercise Goals and Review: Exercise Goals    Row Name 01/03/18 1419             Exercise Goals   Increase Physical Activity  Yes       Intervention  Provide advice, education, support and counseling about physical activity/exercise needs.;Develop an individualized  exercise prescription for aerobic and resistive training based on initial evaluation findings, risk stratification, comorbidities and participant's personal goals.       Expected  Outcomes  Short Term: Attend rehab on a regular basis to increase amount of physical activity.;Long Term: Add in home exercise to make exercise part of routine and to increase amount of physical activity.;Long Term: Exercising regularly at least 3-5 days a week.       Increase Strength and Stamina  Yes       Intervention  Provide advice, education, support and counseling about physical activity/exercise needs.;Develop an individualized exercise prescription for aerobic and resistive training based on initial evaluation findings, risk stratification, comorbidities and participant's personal goals.       Expected Outcomes  Short Term: Increase workloads from initial exercise prescription for resistance, speed, and METs.;Short Term: Perform resistance training exercises routinely during rehab and add in resistance training at home;Long Term: Improve cardiorespiratory fitness, muscular endurance and strength as measured by increased METs and functional capacity (6MWT)       Able to understand and use rate of perceived exertion (RPE) scale  Yes       Intervention  Provide education and explanation on how to use RPE scale       Expected Outcomes  Short Term: Able to use RPE daily in rehab to express subjective intensity level;Long Term:  Able to use RPE to guide intensity level when exercising independently       Able to understand and use Dyspnea scale  Yes       Intervention  Provide education and explanation on how to use Dyspnea scale       Expected Outcomes  Short Term: Able to use Dyspnea scale daily in rehab to express subjective sense of shortness of breath during exertion;Long Term: Able to use Dyspnea scale to guide intensity level when exercising independently       Knowledge and understanding of Target Heart Rate Range  (THRR)  Yes       Intervention  Provide education and explanation of THRR including how the numbers were predicted and where they are located for reference       Expected Outcomes  Short Term: Able to state/look up THRR;Long Term: Able to use THRR to govern intensity when exercising independently;Short Term: Able to use daily as guideline for intensity in rehab       Able to check pulse independently  Yes       Intervention  Provide education and demonstration on how to check pulse in carotid and radial arteries.;Review the importance of being able to check your own pulse for safety during independent exercise       Expected Outcomes  Short Term: Able to explain why pulse checking is important during independent exercise;Long Term: Able to check pulse independently and accurately       Understanding of Exercise Prescription  Yes       Intervention  Provide education, explanation, and written materials on patient's individual exercise prescription       Expected Outcomes  Short Term: Able to explain program exercise prescription;Long Term: Able to explain home exercise prescription to exercise independently          Exercise Goals Re-Evaluation : Exercise Goals Re-Evaluation    Row Name 01/15/18 0908             Exercise Goal Re-Evaluation   Exercise Goals Review  Understanding of Exercise Prescription;Knowledge and understanding of Target Heart Rate Range (THRR);Able to understand and use rate of perceived exertion (RPE) scale       Comments  Reviewed RPE scale, THR and program prescription with pt  today.  Pt voiced understanding and was given a copy of goals to take home.        Expected Outcomes  Short: Use RPE daily to regulate intensity.  Long: Follow program prescription in THR.          Discharge Exercise Prescription (Final Exercise Prescription Changes): Exercise Prescription Changes - 01/15/18 1500      Response to Exercise   Blood Pressure (Admit)  122/70    Blood Pressure  (Exercise)  132/74    Blood Pressure (Exit)  114/60    Heart Rate (Admit)  77 bpm    Heart Rate (Exercise)  103 bpm    Heart Rate (Exit)  86 bpm    Rating of Perceived Exertion (Exercise)  15    Symptoms  fatigue    Comments  first full day of exercise    Duration  Progress to 30 minutes of  aerobic without signs/symptoms of physical distress    Intensity  THRR unchanged      Progression   Progression  Continue to progress workloads to maintain intensity without signs/symptoms of physical distress.    Average METs  1.79      Resistance Training   Training Prescription  Yes    Weight  2 lb    Reps  10-15      Interval Training   Interval Training  No      Treadmill   MPH  1    Grade  0    Minutes  1    METs  1.77      NuStep   Level  1    Minutes  15    METs  1.8       Nutrition:  Target Goals: Understanding of nutrition guidelines, daily intake of sodium <1559m, cholesterol <2050m calories 30% from fat and 7% or less from saturated fats, daily to have 5 or more servings of fruits and vegetables.  Biometrics: Pre Biometrics - 01/03/18 1419      Pre Biometrics   Height  _0  (1.6 m)    Weight  249 lb 14.4 oz (113.4 kg)    Waist Circumference  43 inches    Hip Circumference  57 inches    Waist to Hip Ratio  0.75 %    BMI (Calculated)  44.28        Nutrition Therapy Plan and Nutrition Goals: Nutrition Therapy & Goals - 01/03/18 1513      Intervention Plan   Intervention  Prescribe, educate and counsel regarding individualized specific dietary modifications aiming towards targeted core components such as weight, hypertension, lipid management, diabetes, heart failure and other comorbidities.;Nutrition handout(s) given to patient.    Expected Outcomes  Short Term Goal: Understand basic principles of dietary content, such as calories, fat, sodium, cholesterol and nutrients.;Short Term Goal: A plan has been developed with personal nutrition goals set during  dietitian appointment.;Long Term Goal: Adherence to prescribed nutrition plan.       Nutrition Assessments: Nutrition Assessments - 01/03/18 1353      MEDFICTS Scores   Pre Score  15       Nutrition Goals Re-Evaluation:   Nutrition Goals Discharge (Final Nutrition Goals Re-Evaluation):   Psychosocial: Target Goals: Acknowledge presence or absence of significant depression and/or stress, maximize coping skills, provide positive support system. Participant is able to verbalize types and ability to use techniques and skills needed for reducing stress and depression.   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  Yes husband      Screening Interventions   Interventions  Encouraged to exercise;Program counselor consult;To provide support and resources with identified psychosocial needs;Provide feedback about the scores to participant    Expected Outcomes  Short Term goal: Utilizing psychosocial counselor, staff and physician to assist with identification of specific Stressors or current issues interfering with healing process. Setting desired goal for each stressor or current issue identified.;Long Term Goal: Stressors or current issues are controlled or eliminated.;Long Term goal: The participant improves quality of Life and PHQ9 Scores as seen by post scores and/or verbalization of changes;Short Term goal: Identification and review with participant of any Quality of Life or Depression concerns found by scoring the questionnaire.       Quality of Life Scores:  Quality of Life - 01/03/18 1509      Quality of Life Scores   Health/Function Pre  20.03 %    Socioeconomic Pre  26.5 %     Psych/Spiritual Pre  26.79 %    Family Pre  28.8 %    GLOBAL Pre  24.04 %      Scores of 19 and below usually indicate a poorer quality of life in these areas.  A difference of  2-3 points is a clinically meaningful difference.  A difference of 2-3 points in the total score of the Quality of Life Index has been associated with significant improvement in overall quality of life, self-image, physical symptoms, and general health in studies assessing change in quality of life.  PHQ-9: Recent Review Flowsheet Data    Depression screen [facility] 2/9 01/03/2018 04/25/2017 02/29/2016   Decreased Interest 0 0 0   Down, Depressed, Hopeless 0 0 0   PHQ - 2 Score 0 0 0   Altered sleeping 0 0 -   Tired, decreased energy 3 0 -   Change in appetite 0 0 -   Feeling bad or failure about yourself  0 0 -   Trouble concentrating 0 0 -   Moving slowly or fidgety/restless 0 0 -   Suicidal thoughts 0 0 -   PHQ-9 Score 3 0 -   Difficult doing work/chores Not difficult at all - -     Interpretation of Total Score  Total Score Depression Severity:  1-4 = Minimal depression, 5-9 = Mild depression, 10-14 = Moderate depression, 15-19 = Moderately severe depression, 20-27 = Severe depression   Psychosocial Evaluation and Intervention:   Psychosocial Re-Evaluation:   Psychosocial Discharge (Final Psychosocial Re-Evaluation):   Vocational Rehabilitation: Provide vocational rehab assistance to qualifying candidates.   Vocational Rehab Evaluation & Intervention: Vocational Rehab - 01/03/18 1511      Initial Vocational Rehab Evaluation & Intervention   Assessment shows need for Vocational Rehabilitation  No       Education: Education Goals: Education classes will be provided on a variety of topics geared toward better understanding of heart health and risk factor modification. Participant will state understanding/return demonstration of topics presented as noted by education test scores.  Learning  Barriers/Preferences: Learning Barriers/Preferences - 01/03/18 1513      Learning Barriers/Preferences  Learning Barriers  None    Learning Preferences  Individual Instruction       Education Topics:  AED/CPR: - Group verbal and written instruction with the use of models to demonstrate the basic use of the AED with the basic ABC's of resuscitation.   General Nutrition Guidelines/Fats and Fiber: -Group instruction provided by verbal, written material, models and posters to present the general guidelines for heart healthy nutrition. Gives an explanation and review of dietary fats and fiber.   Controlling Sodium/Reading Food Labels: -Group verbal and written material supporting the discussion of sodium use in heart healthy nutrition. Review and explanation with models, verbal and written materials for utilization of the food label.   Exercise Physiology & General Exercise Guidelines: - Group verbal and written instruction with models to review the exercise physiology of the cardiovascular system and associated critical values. Provides general exercise guidelines with specific guidelines to those with heart or lung disease.    Aerobic Exercise & Resistance Training: - Gives group verbal and written instruction on the various components of exercise. Focuses on aerobic and resistive training programs and the benefits of this training and how to safely progress through these programs..   Flexibility, Balance, Mind/Body Relaxation: Provides group verbal/written instruction on the benefits of flexibility and balance training, including mind/body exercise modes such as yoga, pilates and tai chi.  Demonstration and skill practice provided.   Stress and Anxiety: - Provides group verbal and written instruction about the health risks of elevated stress and causes of high stress.  Discuss the correlation between heart/lung disease and anxiety and treatment options. Review healthy ways to  manage with stress and anxiety.   Depression: - Provides group verbal and written instruction on the correlation between heart/lung disease and depressed mood, treatment options, and the stigmas associated with seeking treatment.   Anatomy & Physiology of the Heart: - Group verbal and written instruction and models provide basic cardiac anatomy and physiology, with the coronary electrical and arterial systems. Review of Valvular disease and Heart Failure   Cardiac Rehab from 01/17/2018 in [facility] Cardiac and Pulmonary Rehab  Date  01/17/18  Educator  CE  Instruction Review Code  1- Verbalizes Understanding      Cardiac Procedures: - Group verbal and written instruction to review commonly prescribed medications for heart disease. Reviews the medication, class of the drug, and side effects. Includes the steps to properly store meds and maintain the prescription regimen. (beta blockers and nitrates)   Cardiac Medications I: - Group verbal and written instruction to review commonly prescribed medications for heart disease. Reviews the medication, class of the drug, and side effects. Includes the steps to properly store meds and maintain the prescription regimen.   Cardiac Medications II: -Group verbal and written instruction to review commonly prescribed medications for heart disease. Reviews the medication, class of the drug, and side effects. (all other drug classes)    Go Sex-Intimacy & Heart Disease, Get SMART - Goal Setting: - Group verbal and written instruction through game format to discuss heart disease and the return to sexual intimacy. Provides group verbal and written material to discuss and apply goal setting through the application of the S.M.A.R.T. Method.   Other Matters of the Heart: - Provides group verbal, written materials and models to describe Stable Angina and Peripheral Artery. Includes description of the disease process and treatment options available to the cardiac  patient.   Exercise & Equipment Safety: - Individual verbal instruction and demonstration of equipment use  and safety with use of the equipment.   Cardiac Rehab from 01/17/2018 in [facility] Cardiac and Pulmonary Rehab  Date  01/03/18  Educator  [facility]  Instruction Review Code  1- Verbalizes Understanding      Infection Prevention: - Provides verbal and written material to individual with discussion of infection control including proper hand washing and proper equipment cleaning during exercise session.   Cardiac Rehab from 01/17/2018 in [facility] Cardiac and Pulmonary Rehab  Date  01/03/18  Educator  [facility]  Instruction Review Code  1- Verbalizes Understanding      Falls Prevention: - Provides verbal and written material to individual with discussion of falls prevention and safety.   Cardiac Rehab from 01/17/2018 in [facility] Cardiac and Pulmonary Rehab  Date  01/03/18  Educator  [facility]  Instruction Review Code  1- Verbalizes Understanding      Diabetes: - Individual verbal and written instruction to review signs/symptoms of diabetes, desired ranges of glucose level fasting, after meals and with exercise. Acknowledge that pre and post exercise glucose checks will be done for 3 sessions at entry of program.   Know Your Numbers and Risk Factors: -Group verbal and written instruction about important numbers in your health.  Discussion of what are risk factors and how they play a role in the disease process.  Review of Cholesterol, Blood Pressure, Diabetes, and BMI and the role they play in your overall health.   Sleep Hygiene: -Provides group verbal and written instruction about how sleep can affect your health.  Define sleep hygiene, discuss sleep cycles and impact of sleep habits. Review good sleep hygiene tips.    Cardiac Rehab from 01/17/2018 in [facility] Cardiac and Pulmonary Rehab  Date  01/15/18  Educator  [facility]  Instruction Review Code  1- Verbalizes Understanding      Other: -Provides group and verbal  instruction on various topics (see comments)   Knowledge Questionnaire Score: Knowledge Questionnaire Score - 01/03/18 1510      Knowledge Questionnaire Score   Pre Score  26/28       Core Components/Risk Factors/Patient Goals at Admission: Personal Goals and Risk Factors at Admission - 01/03/18 1352      Core Components/Risk Factors/Patient Goals on Admission    Weight Management  Yes;Obesity;Weight Loss    Intervention  Weight Management: Develop a combined nutrition and exercise program designed to reach desired caloric intake, while maintaining appropriate intake of nutrient and fiber, sodium and fats, and appropriate energy expenditure required for the weight goal.;Weight Management: Provide education and appropriate resources to help participant work on and attain dietary goals.;Weight Management/Obesity: Establish reasonable short term and long term weight goals.;Obesity: Provide education and appropriate resources to help participant work on and attain dietary goals.    Admit Weight  250 lb (113.4 kg)    Goal Weight: Short Term  245 lb (111.1 kg)    Goal Weight: Long Term  200 lb (90.7 kg)    Expected Outcomes  Short Term: Continue to assess and modify interventions until short term weight is achieved;Long Term: Adherence to nutrition and physical activity/exercise program aimed toward attainment of established weight goal    Heart Failure  Yes    Intervention  Provide a combined exercise and nutrition program that is supplemented with education, support and counseling about heart failure. Directed toward relieving symptoms such as shortness of breath, decreased exercise tolerance, and extremity edema.    Expected Outcomes  Improve functional capacity of life;Short term: Attendance in program 2-3 days  a week with increased exercise capacity. Reported lower sodium intake. Reported increased fruit and vegetable intake. Reports medication compliance.;Short term: Daily weights obtained and  reported for increase. Utilizing diuretic protocols set by physician.;Long term: Adoption of self-care skills and reduction of barriers for early signs and symptoms recognition and intervention leading to self-care maintenance.    Hypertension  Yes    Intervention  Provide education on lifestyle modifcations including regular physical activity/exercise, weight management, moderate sodium restriction and increased consumption of fresh fruit, vegetables, and low fat dairy, alcohol moderation, and smoking cessation.;Monitor prescription use compliance.    Expected Outcomes  Short Term: Continued assessment and intervention until BP is < 140/86m HG in hypertensive participants. < 130/864mHG in hypertensive participants with diabetes, heart failure or chronic kidney disease.;Long Term: Maintenance of blood pressure at goal levels.    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 Teach and support self-help strategies.;Refer participants experiencing significant psychosocial distress to appropriate mental health specialists for further evaluation and treatment. When possible, include family members and significant others in education/counseling sessions.    Expected Outcomes  Long Term: Emotional wellbeing is indicated by absence of clinically significant psychosocial distress or social isolation.;Short Term: Participant demonstrates changes in health-related behavior, relaxation and other stress management skills, ability to obtain effective social support, and compliance with psychotropic medications if prescribed.       Core Components/Risk Factors/Patient Goals Review:    Core Components/Risk Factors/Patient Goals at Discharge (Final Review):    ITP Comments: ITP Comments    Row Name 01/03/18 1347  01/23/18 0631         ITP Comments  Med Review completed. Initial ITP created. Diagnosis can be found in Care Everywhere 11/15/17  30 Day review. Continue with ITP unless directed changes per Medical Director review.   New to program         Comments:

## 2018-01-24 ENCOUNTER — Telehealth: Payer: Self-pay | Admitting: *Deleted

## 2018-01-24 DIAGNOSIS — Z952 Presence of prosthetic heart valve: Secondary | ICD-10-CM

## 2018-01-24 DIAGNOSIS — Z48812 Encounter for surgical aftercare following surgery on the circulatory system: Secondary | ICD-10-CM | POA: Diagnosis not present

## 2018-01-24 MED ORDER — WARFARIN SODIUM 5 MG PO TABS: 5.0000 mg | ORAL_TABLET | Freq: Every day | ORAL | 3 refills | Status: DC

## 2018-01-24 NOTE — Progress Notes (Signed)
Daily Session Note  Patient Details  Name: Victoria Davila MRN: [id number] Date of Birth: Mar 11, 1956 Referring Provider:     Cardiac Rehab from 01/03/2018 in [facility] Cardiac and Pulmonary Rehab  Referring Provider  [name]      Encounter Date: 01/24/2018  Check In: Session Check In - 01/24/18 0834      Check-In   Location  [facility]-Cardiac & Pulmonary Rehab    Staff Present  Alberteen Sam, MA, RCEP, CCRP, Exercise Physiologist;Amanda Oletta Darter, BA, ACSM CEP, Exercise Physiologist;Carroll Enterkin, RN, BSN    Supervising physician immediately available to respond to emergencies  See telemetry face sheet for immediately available ER MD    Medication changes reported      No    Fall or balance concerns reported     No    Warm-up and Cool-down  Performed on first and last piece of equipment    Resistance Training Performed  Yes    VAD Patient?  No      Pain Assessment   Currently in Pain?  No/denies    Multiple Pain Sites  No          Social History   Tobacco Use  Smoking Status Never Smoker  Smokeless Tobacco Never Used    Goals Met:  Independence with exercise equipment Exercise tolerated well No report of cardiac concerns or symptoms Strength training completed today  Goals Unmet:  Not Applicable  Comments: Pt able to follow exercise prescription today without complaint.  Will continue to monitor for progression.    Dr. Emily Filbert is Medical Director for [place] and [facility].

## 2018-01-24 NOTE — Telephone Encounter (Signed)
Patient was notified of results. Expressed understanding. Rx sent to pharmacy. 

## 2018-01-24 NOTE — Telephone Encounter (Signed)
-----  Message from Richard L Gilbert Jr., MD sent at 01/23/2018  4:50 PM EDT ----- Present coumadin dose--decrease by 1 mg daily. Repeat ESR 3-4 weeks. INR 1 week.  

## 2018-01-29 ENCOUNTER — Encounter: Payer: BC Managed Care – PPO | Attending: Family Medicine | Admitting: *Deleted

## 2018-01-29 DIAGNOSIS — Z952 Presence of prosthetic heart valve: Secondary | ICD-10-CM | POA: Insufficient documentation

## 2018-01-29 DIAGNOSIS — I4891 Unspecified atrial fibrillation: Secondary | ICD-10-CM | POA: Insufficient documentation

## 2018-01-29 DIAGNOSIS — Z79899 Other long term (current) drug therapy: Secondary | ICD-10-CM | POA: Insufficient documentation

## 2018-01-29 DIAGNOSIS — M069 Rheumatoid arthritis, unspecified: Secondary | ICD-10-CM | POA: Insufficient documentation

## 2018-01-29 DIAGNOSIS — E785 Hyperlipidemia, unspecified: Secondary | ICD-10-CM | POA: Insufficient documentation

## 2018-01-29 DIAGNOSIS — Z7901 Long term (current) use of anticoagulants: Secondary | ICD-10-CM | POA: Diagnosis not present

## 2018-01-29 DIAGNOSIS — Z48812 Encounter for surgical aftercare following surgery on the circulatory system: Secondary | ICD-10-CM | POA: Diagnosis present

## 2018-01-29 DIAGNOSIS — I10 Essential (primary) hypertension: Secondary | ICD-10-CM | POA: Insufficient documentation

## 2018-01-29 DIAGNOSIS — G709 Myoneural disorder, unspecified: Secondary | ICD-10-CM | POA: Diagnosis not present

## 2018-01-29 NOTE — Progress Notes (Signed)
Daily Session Note  Patient Details  Name: Victoria Davila MRN: [id number] Date of Birth: [birth year] Referring Provider:     Cardiac Rehab from 01/03/2018 in [facility] Cardiac and Pulmonary Rehab  Referring Provider  [name]      Encounter Date: 01/29/2018  Check In: Session Check In - 01/29/18 0821      Check-In   Location  [facility]Cardiac [facility] Pulmonary Rehab    Staff Present  Heath Lark, RN, BSN, CCRP;Jessica [place]ford, MA, RCEP, CCRP, Exercise Physiologist;Kathline Banbury Oletta Darter, [place], ACSM CEP, Exercise Physiologist    Supervising physician immediately available to respond to emergencies  See telemetry face sheet for immediately available ER MD    Medication changes reported      No    Fall or balance concerns reported     No    Warm-up and Cool-down  Performed on first and last piece of equipment    Resistance Training Performed  Yes    VAD Patient?  No      Pain Assessment   Currently in Pain?  No/denies        Exercise Prescription Changes - 01/29/18 1000      Home Exercise Plan   Plans to continue exercise at  [facility] (comment) water exercise     Frequency  Add 1 additional day to program exercise sessions.    Initial Home Exercises Provided  01/29/18       Social History   Tobacco Use  Smoking Status Never Smoker  Smokeless Tobacco Never Used    Goals Met:  Independence with exercise equipment Exercise tolerated well No report of cardiac concerns or symptoms Strength training completed today  Goals Unmet:  Not Applicable  Comments: Reviewed home exercise with pt today.  Pt plans to do water exercise for exercise.  Reviewed THR, pulse, RPE, sign and symptoms, NTG use, and when to call 911 or MD.  Also discussed weather considerations and indoor options.  Pt voiced understanding.   Dr. Emily Filbert is Medical Director for [place] and [facility].

## 2018-01-29 NOTE — Progress Notes (Signed)
Daily Session Note  Patient Details  Name: Victoria Davila MRN: [id number] Date of Birth: [birth year] Referring Provider:     Cardiac Rehab from 01/03/2018 in [facility] Cardiac and Pulmonary Rehab  Referring Provider  [name]      Encounter Date: 01/29/2018  Check In: Session Check In - 01/29/18 0821      Check-In   Location  [facility]    Staff Present  Heath Lark, RN, BSN, CCRP;Audley Hinojos [place], MA, RCEP, CCRP, Exercise Physiologist;Amanda Oletta Darter, [place], ACSM CEP, Exercise Physiologist    Supervising physician immediately available to respond to emergencies  See telemetry face sheet for immediately available ER MD    Medication changes reported      No    Fall or balance concerns reported     No    Warm-up and Cool-down  Performed on first and last piece of equipment    Resistance Training Performed  Yes    VAD Patient?  No      Pain Assessment   Currently in Pain?  No/denies          Social History   Tobacco Use  Smoking Status Never Smoker  Smokeless Tobacco Never Used    Goals Met:  Independence with exercise equipment Exercise tolerated well No report of cardiac concerns or symptoms Strength training completed today  Goals Unmet:  Not Applicable  Comments: Pt able to follow exercise prescription today without complaint.  Will continue to monitor for progression.    Dr. Emily Filbert is Medical Director for [place] and [facility].

## 2018-01-30 ENCOUNTER — Other Ambulatory Visit: Payer: Self-pay | Admitting: Family Medicine

## 2018-01-30 DIAGNOSIS — R609 Edema, unspecified: Secondary | ICD-10-CM

## 2018-01-30 NOTE — Telephone Encounter (Signed)
[facility] faxed a refill request for the following medication. Thanks CC  torsemide (DEMADEX) 20 MG tablet

## 2018-01-30 NOTE — Telephone Encounter (Signed)
Please review. Thanks!  

## 2018-01-31 ENCOUNTER — Encounter: Payer: BC Managed Care – PPO | Admitting: *Deleted

## 2018-01-31 DIAGNOSIS — Z48812 Encounter for surgical aftercare following surgery on the circulatory system: Secondary | ICD-10-CM | POA: Diagnosis not present

## 2018-01-31 DIAGNOSIS — Z952 Presence of prosthetic heart valve: Secondary | ICD-10-CM

## 2018-01-31 MED ORDER — TORSEMIDE 20 MG PO TABS: 40.0000 mg | ORAL_TABLET | Freq: Two times a day (BID) | ORAL | 4 refills | Status: AC

## 2018-01-31 NOTE — Progress Notes (Signed)
Daily Session Note  Patient Details  Name: Victoria Davila MRN: [id number] Date of Birth: 08-06-1956 Referring Provider:     Cardiac Rehab from 01/03/2018 in [facility] Cardiac and Pulmonary Rehab  Referring Provider  [name]      Encounter Date: 01/31/2018  Check In: Session Check In - 01/31/18 0824      Check-In   Location  [facility]    Staff Present  Alberteen Sam, MA, RCEP, CCRP, Exercise Physiologist;Amanda Oletta Darter, BA, ACSM CEP, Exercise Physiologist;Carroll Enterkin, RN, BSN    Supervising physician immediately available to respond to emergencies  See telemetry face sheet for immediately available ER MD    Medication changes reported      No    Fall or balance concerns reported     No    Warm-up and Cool-down  Performed on first and last piece of equipment    Resistance Training Performed  Yes    VAD Patient?  No      Pain Assessment   Currently in Pain?  No/denies        Exercise Prescription Changes - 01/30/18 1400      Response to Exercise   Blood Pressure (Admit)  122/64    Blood Pressure (Exercise)  142/68    Blood Pressure (Exit)  134/80    Heart Rate (Admit)  89 bpm    Heart Rate (Exercise)  113 bpm    Heart Rate (Exit)  115 bpm    Rating of Perceived Exertion (Exercise)  12    Symptoms  fatigue    Duration  Continue with 30 min of aerobic exercise without signs/symptoms of physical distress.    Intensity  THRR unchanged      Progression   Progression  Continue to progress workloads to maintain intensity without signs/symptoms of physical distress.    Average METs  1.85      Resistance Training   Training Prescription  Yes    Weight  2 lbs    Reps  10-15      Interval Training   Interval Training  No      Treadmill   MPH  1    Grade  0    Minutes  15    METs  1.77      NuStep   Level  1    Minutes  15    METs  1.8      Biostep-RELP   Level  1    Minutes  15    METs  2      Home Exercise Plan   Plans to continue  exercise at  [facility] (comment) water exercise     Frequency  Add 1 additional day to program exercise sessions.    Initial Home Exercises Provided  01/29/18       Social History   Tobacco Use  Smoking Status Never Smoker  Smokeless Tobacco Never Used    Goals Met:  Independence with exercise equipment Exercise tolerated well No report of cardiac concerns or symptoms Strength training completed today  Goals Unmet:  Not Applicable  Comments: Pt able to follow exercise prescription today without complaint.  Will continue to monitor for progression.    Dr. Emily Filbert is Medical Director for [place] and [facility].

## 2018-02-01 ENCOUNTER — Ambulatory Visit (INDEPENDENT_AMBULATORY_CARE_PROVIDER_SITE_OTHER): Payer: BC Managed Care – PPO

## 2018-02-01 DIAGNOSIS — I050 Rheumatic mitral stenosis: Secondary | ICD-10-CM

## 2018-02-01 LAB — POCT INR
INR: 2.4
PT: 29

## 2018-02-01 NOTE — Patient Instructions (Signed)
Description   Continue 5 mg daily. Follow up in 2 weeks.

## 2018-02-05 DIAGNOSIS — Z952 Presence of prosthetic heart valve: Secondary | ICD-10-CM

## 2018-02-05 DIAGNOSIS — Z48812 Encounter for surgical aftercare following surgery on the circulatory system: Secondary | ICD-10-CM | POA: Diagnosis not present

## 2018-02-05 NOTE — Progress Notes (Signed)
Daily Session Note  Patient Details  Name: Victoria Davila MRN: [id number] Date of Birth: 1955-11-06 Referring Provider:     Cardiac Rehab from 01/03/2018 in [facility] Cardiac and Pulmonary Rehab  Referring Provider  [name]      Encounter Date: 02/05/2018  Check In: Session Check In - 02/05/18 0830      Check-In   Location  [facility]iac [facility] Re[facility]    Staff Present  Alberteen Sam, MA, RCEP, CCRP, Exercise Physiologist;Ziasia Lenoir Oletta Darter, BA, ACSM CEP, Exercise Physiologist;Susanne Bice, RN, BSN, CCRP    Supervising physician immediately available to respond to emergencies  See telemetry face sheet for immediately available ER MD    Medication changes reported      No    Fall or balance concerns reported     No    Warm-up and Cool-down  Performed on first and last piece of equipment    Resistance Training Performed  Yes    VAD Patient?  No      Pain Assessment   Currently in Pain?  No/denies    Multiple Pain Sites  No          Social History   Tobacco Use  Smoking Status Never Smoker  Smokeless Tobacco Never Used    Goals Met:  Independence with exercise equipment Exercise tolerated well No report of cardiac concerns or symptoms Strength training completed today  Goals Unmet:  Not Applicable  Comments: Pt able to follow exercise prescription today without complaint.  Will continue to monitor for progression.    Dr. Emily Filbert is Medical Director for [place] and [facility].

## 2018-02-07 ENCOUNTER — Other Ambulatory Visit: Payer: Self-pay | Admitting: Family Medicine

## 2018-02-07 DIAGNOSIS — Z48812 Encounter for surgical aftercare following surgery on the circulatory system: Secondary | ICD-10-CM | POA: Diagnosis not present

## 2018-02-07 DIAGNOSIS — Z952 Presence of prosthetic heart valve: Secondary | ICD-10-CM

## 2018-02-07 NOTE — Progress Notes (Signed)
Daily Session Note  Patient Details  Name: Victoria Davila MRN: [id number] Date of Birth: 08/26/1956 Referring Provider:     Cardiac Rehab from 01/03/2018 in [facility] Cardiac and Pulmonary Rehab  Referring Provider  [name]      Encounter Date: 02/07/2018  Check In: Session Check In - 02/07/18 0830      Check-In   Location  [facility] Re[facility]    Staff Present  Alberteen Sam, MA, RCEP, CCRP, Exercise Physiologist;Amanda Oletta Darter, BA, ACSM CEP, Exercise Physiologist;Carroll Enterkin, RN, BSN    Supervising physician immediately available to respond to emergencies  See telemetry face sheet for immediately available ER MD    Medication changes reported      No    Fall or balance concerns reported     No    Warm-up and Cool-down  Performed on first and last piece of equipment    Resistance Training Performed  Yes    VAD Patient?  No      Pain Assessment   Currently in Pain?  No/denies    Multiple Pain Sites  No          Social History   Tobacco Use  Smoking Status Never Smoker  Smokeless Tobacco Never Used    Goals Met:  Independence with exercise equipment Exercise tolerated well No report of cardiac concerns or symptoms Strength training completed today  Goals Unmet:  Not Applicable  Comments: Pt able to follow exercise prescription today without complaint.  Will continue to monitor for progression.    Dr. Emily Davila is Medical Director for [place] and [facility].

## 2018-02-12 ENCOUNTER — Encounter: Payer: BC Managed Care – PPO | Admitting: *Deleted

## 2018-02-12 DIAGNOSIS — Z48812 Encounter for surgical aftercare following surgery on the circulatory system: Secondary | ICD-10-CM | POA: Diagnosis not present

## 2018-02-12 DIAGNOSIS — Z952 Presence of prosthetic heart valve: Secondary | ICD-10-CM

## 2018-02-12 NOTE — Progress Notes (Signed)
Daily Session Note  Patient Details  Name: Victoria Davila MRN: [id number] Date of Birth: 1956/03/13 Referring Provider:     Cardiac Rehab from 01/03/2018 in [facility] Cardiac and Pulmonary Rehab  Referring Provider  [name]      Encounter Date: 02/12/2018  Check In: Session Check In - 02/12/18 0933      Check-In   Location  [facility]Cardiac & Pulmonary Re[facility]    Staff Present  Heath Lark, RN, BSN, CCRP;Luan Urbani [place], MA, RCEP, CCRP, Exercise Physiologist;Amanda Oletta Darter, [place], ACSM CEP, Exercise Physiologist    Supervising physician immediately available to respond to emergencies  See telemetry face sheet for immediately available ER MD    Medication changes reported      No    Fall or balance concerns reported     No    Warm-up and Cool-down  Performed on first and last piece of equipment    Resistance Training Performed  Yes    VAD Patient?  No      Pain Assessment   Currently in Pain?  No/denies          Social History   Tobacco Use  Smoking Status Never Smoker  Smokeless Tobacco Never Used    Goals Met:  Independence with exercise equipment Exercise tolerated well No report of cardiac concerns or symptoms Strength training completed today  Goals Unmet:  Not Applicable  Comments: Pt able to follow exercise prescription today without complaint.  Will continue to monitor for progression.    Dr. Emily Filbert is Medical Director for [place] and [facility].

## 2018-02-14 ENCOUNTER — Encounter: Payer: BC Managed Care – PPO | Admitting: *Deleted

## 2018-02-14 DIAGNOSIS — Z952 Presence of prosthetic heart valve: Secondary | ICD-10-CM

## 2018-02-14 DIAGNOSIS — Z48812 Encounter for surgical aftercare following surgery on the circulatory system: Secondary | ICD-10-CM | POA: Diagnosis not present

## 2018-02-14 NOTE — Progress Notes (Signed)
Daily Session Note  Patient Details  Name: Victoria Davila MRN: [id number] Date of Birth: [birth year] Referring Provider:     Cardiac Rehab from 01/03/2018 in [facility] Cardiac and Pulmonary Rehab  Referring Provider  [name]      Encounter Date: 02/14/2018  Check In: Session Check In - 02/14/18 0910      Check-In   Location  [facility]Cardiac [facility]monary Re[facility]    Staff Present  Alberteen Sam, MA, RCEP, CCRP, Exercise Physiologist;Amanda Oletta Darter, BA, ACSM CEP, Exercise Physiologist;Carroll Enterkin, RN, BSN    Supervising physician immediately available to respond to emergencies  See telemetry face sheet for immediately available ER MD    Medication changes reported      No    Fall or balance concerns reported     No    Warm-up and Cool-down  Performed on first and last piece of equipment    Resistance Training Performed  Yes    VAD Patient?  No      Pain Assessment   Currently in Pain?  No/denies          Social History   Tobacco Use  Smoking Status Never Smoker  Smokeless Tobacco Never Used    Goals Met:  Independence with exercise equipment Exercise tolerated well No report of cardiac concerns or symptoms Strength training completed today  Goals Unmet:  Not Applicable  Comments: Pt able to follow exercise prescription today without complaint.  Will continue to monitor for progression.    Dr. Emily Filbert is Medical Director for [place] and [facility].

## 2018-02-15 ENCOUNTER — Ambulatory Visit (INDEPENDENT_AMBULATORY_CARE_PROVIDER_SITE_OTHER): Payer: BC Managed Care – PPO

## 2018-02-15 DIAGNOSIS — I050 Rheumatic mitral stenosis: Secondary | ICD-10-CM

## 2018-02-15 DIAGNOSIS — I052 Rheumatic mitral stenosis with insufficiency: Secondary | ICD-10-CM | POA: Diagnosis not present

## 2018-02-15 LAB — POCT INR
INR: 2.4
PT: 25.7

## 2018-02-15 NOTE — Patient Instructions (Signed)
Description   6 mg daily. Follow up in 2 weeks.

## 2018-02-19 ENCOUNTER — Encounter: Payer: BC Managed Care – PPO | Admitting: *Deleted

## 2018-02-19 ENCOUNTER — Ambulatory Visit: Payer: Self-pay | Admitting: Family Medicine

## 2018-02-19 DIAGNOSIS — Z952 Presence of prosthetic heart valve: Secondary | ICD-10-CM

## 2018-02-19 DIAGNOSIS — Z48812 Encounter for surgical aftercare following surgery on the circulatory system: Secondary | ICD-10-CM | POA: Diagnosis not present

## 2018-02-19 NOTE — Progress Notes (Signed)
Daily Session Note  Patient Details  Name: Victoria Davila MRN: [id number] Date of Birth: 03-Aug-1956 Referring Provider:     Cardiac Rehab from 01/03/2018 in [facility] Cardiac and Pulmonary Rehab  Referring Provider  [name]      Encounter Date: 02/19/2018  Check In: Session Check In - 02/19/18 0818      Check-In   Location  [facility]Cardiac [facility] Re[facility]    Staff Present  Heath Lark, RN, BSN, CCRP;Teah Votaw [place]ers, MA, RCEP, CCRP, Exercise Physiologist;Amanda Oletta Darter, [place], ACSM CEP, Exercise Physiologist    Supervising physician immediately available to respond to emergencies  See telemetry face sheet for immediately available ER MD    Medication changes reported      No    Fall or balance concerns reported     No    Warm-up and Cool-down  Performed on first and last piece of equipment    Resistance Training Performed  Yes    VAD Patient?  No      Pain Assessment   Currently in Pain?  No/denies          Social History   Tobacco Use  Smoking Status Never Smoker  Smokeless Tobacco Never Used    Goals Met:  Independence with exercise equipment Exercise tolerated well No report of cardiac concerns or symptoms Strength training completed today  Goals Unmet:  Not Applicable  Comments: Pt able to follow exercise prescription today without complaint.  Will continue to monitor for progression.    Dr. Emily Filbert is Medical Director for [place] and [facility].

## 2018-02-20 ENCOUNTER — Encounter: Payer: Self-pay | Admitting: *Deleted

## 2018-02-20 DIAGNOSIS — Z952 Presence of prosthetic heart valve: Secondary | ICD-10-CM

## 2018-02-20 NOTE — Progress Notes (Signed)
Cardiac Individual Treatment Plan  Patient Details  Name: Victoria Davila MRN: [id number] Date of Birth: [birth year] Referring Provider:     Cardiac Rehab from 01/03/2062 in [facility] Cardiac and Pulmonary Rehab  Referring Provider  [name]      Initial Encounter Date:    Cardiac Rehab from 01/03/2062 in [facility] Cardiac and Pulmonary Rehab  Date  01/03/18  Referring Provider  [name]      Visit Diagnosis: S/P mitral valve replacement  Patient's Home Medications on Admission:  Current Outpatient Medications:  .  acetaminophen (TYLENOL) 500 MG tablet, Take 1,000 mg by mouth every 6 (six) hours as needed for mild pain or moderate pain., Disp: , Rfl:  .  aspirin 81 MG chewable tablet, Chew 81 mg by mouth daily., Disp: , Rfl:  .  DILANTIN 100 MG ER capsule, Take 4 capsules (400 mg total) by mouth daily., Disp: 360 capsule, Rfl: 3 .  folic acid (FOLVITE) 1 MG tablet, Take 1 mg by mouth daily., Disp: , Rfl:  .  gabapentin (NEURONTIN) 100 MG capsule, Take 100 mg by mouth 3 (three) times daily., Disp: , Rfl:  .  metoprolol succinate (TOPROL-XL) 25 MG 24 hr tablet, Take 25 mg by mouth daily., Disp: , Rfl:  .  Multiple Vitamin (MULTIVITAMIN WITH MINERALS) TABS tablet, Take 1 tablet by mouth at bedtime., Disp: , Rfl:  .  Omega-3 Fatty Acids (FISH OIL) 1200 MG CAPS, Take 1 capsule by mouth 2 (two) times daily. , Disp: , Rfl:  .  potassium chloride (K-DUR) 10 MEQ tablet, Take 3 tablets (30 mEq total) by mouth 2 (two) times daily., Disp: 180 tablet, Rfl: 1 .  psyllium (REGULOID) 0.52 G capsule, Take 0.52 g by mouth 2 (two) times daily., Disp: , Rfl:  .  torsemide (DEMADEX) 20 MG tablet, Take 2 tablets (40 mg total) by mouth 2 (two) times daily., Disp: 60 tablet, Rfl: 4 .  valACYclovir (VALTREX) 1000 MG tablet, Take 1 tablet (1,000 mg total) by mouth 2 (two) times daily. For one day., Disp: 60 tablet, Rfl: 5 .  warfarin (COUMADIN) 3 MG tablet, Take 2 tablets (6 mg total) by mouth daily., Disp: 60 tablet,  Rfl: 5 .  warfarin (COUMADIN) 4 MG tablet, TAKE 1 TABLET EVERY DAY WITH A 3MG TABLET, Disp: 30 tablet, Rfl: 5 .  warfarin (COUMADIN) 5 MG tablet, Take 1 tablet (5 mg total) by mouth daily., Disp: 30 tablet, Rfl: 3 .  warfarin (COUMADIN) 6 MG tablet, Take 1 tablet (6 mg total) by mouth daily., Disp: 30 tablet, Rfl: 12  Past Medical History: Past Medical History:  Diagnosis Date  . Dysrhythmia    A FIB / FOLLOWED BY DR [place]  . Edema of both legs    CHRONIC  . Hyperlipidemia   . Hypertension   . Mitral valve disorder    "deformed"  . Neuromuscular disorder ([place])   . Rheumatoid arthritis ([place])    RECENT DX - HAS NOT YET SEEN RHEUMATOLOGIST  . Seizures ([place])    LAST SEIZURE JAN 1995  . Shortness of breath dyspnea    WITH EXERTION    Tobacco Use: Social History   Tobacco Use  Smoking Status Never Smoker  Smokeless Tobacco Never Used    Labs: Recent Review Flowsheet Data    Labs for ITP Cardiac and Pulmonary Rehab Latest Ref Rng & Units 09/28/2014 03/01/2016 05/25/2017 10/05/2017   Cholestrol <200 mg/dL - 287(H) 266(H) 259(H)   LDLCALC mg/dL (calc) -  183(H) 154(H) 161(H)   HDL >50 mg/dL - 68 80 68   Trlycerides <150 mg/dL - 178(H) 161(H) 155(H)   Hemoglobin A1c 4.8 - 5.6 % 5.7 5.6 - -       Exercise Target Goals:    Exercise Program Goal: Individual exercise prescription set using results from initial 6 min walk test and THRR while considering  patient's activity barriers and safety.   Exercise Prescription Goal: Initial exercise prescription builds to 30-45 minutes a day of aerobic activity, 2-3 days per week.  Home exercise guidelines will be given to patient during program as part of exercise prescription that the participant will acknowledge.  Activity Barriers & Risk Stratification: Activity Barriers & Cardiac Risk Stratification - 01/03/18 1508      Activity Barriers & Cardiac Risk Stratification   Activity Barriers  Arthritis;Back Problems;Joint  Problems;Left Knee Replacement;Incisional Pain;Shortness of Breath    Cardiac Risk Stratification  Moderate       6 Minute Walk: 6 Minute Walk    Row Name 01/03/18 1420         6 Minute Walk   Distance  600 feet     Walk Time  4.5 minutes     # of Rest Breaks  2     MPH  1.5     METS  1.5     RPE  15     Perceived Dyspnea   2     VO2 Peak  5.12     Symptoms  Yes (comment)     Comments  leg tightness (quads) 5/10 - afraid of falling      Resting HR  88 bpm     Resting BP  118/84     Resting Oxygen Saturation   99 %     Exercise Oxygen Saturation  during 6 min walk  95 %     Max Ex. HR  123 bpm     Max Ex. BP  138/88     2 Minute Post BP  136/84        Oxygen Initial Assessment:   Oxygen Re-Evaluation:   Oxygen Discharge (Final Oxygen Re-Evaluation):   Initial Exercise Prescription: Initial Exercise Prescription - 01/03/18 1400      Date of Initial Exercise RX and Referring Provider   Date  01/03/18    Referring Provider  [name]      Treadmill   MPH  1    Grade  0    Minutes  15    METs  1.5      NuStep   Level  1    SPM  80    Minutes  15    METs  1.5      Biostep-RELP   Level  1    SPM  50    Minutes  15    METs  1.5      Track   Laps  11    Minutes  15    METs  1.5      Prescription Details   Frequency (times per week)  3    Duration  Progress to 30 minutes of continuous aerobic without signs/symptoms of physical distress      Intensity   THRR 40-80% of Max Heartrate  116-144    Ratings of Perceived Exertion  11-15    Perceived Dyspnea  0-4      Resistance Training   Training Prescription  Yes    Weight  2 lb  Reps  10-15       Perform Capillary Blood Glucose checks as needed.  Exercise Prescription Changes: Exercise Prescription Changes    Row Name 01/03/18 1400 01/15/18 1500 01/29/18 1000 01/30/18 1400 02/12/18 1600     Response to Exercise   Blood Pressure (Admit)  118/84  122/70  -  122/64  126/62   Blood Pressure  (Exercise)  138/88  132/74  -  142/68  136/60   Blood Pressure (Exit)  136/84  114/60  -  134/80  116/70   Heart Rate (Admit)  97 bpm  77 bpm  -  89 bpm  59 bpm   Heart Rate (Exercise)  121 bpm  103 bpm  -  113 bpm  99 bpm   Heart Rate (Exit)  107 bpm  86 bpm  -  115 bpm  76 bpm   Oxygen Saturation (Admit)  99 %  -  -  -  -   Oxygen Saturation (Exit)  95 %  -  -  -  -   Rating of Perceived Exertion (Exercise)  15  15  -  12  13   Symptoms  -  fatigue  -  fatigue  fatigue   Comments  -  first full day of exercise  -  -  -   Duration  -  Progress to 30 minutes of  aerobic without signs/symptoms of physical distress  -  Continue with 30 min of aerobic exercise without signs/symptoms of physical distress.  Continue with 30 min of aerobic exercise without signs/symptoms of physical distress.   Intensity  -  THRR unchanged  -  THRR unchanged  THRR unchanged     Progression   Progression  -  Continue to progress workloads to maintain intensity without signs/symptoms of physical distress.  -  Continue to progress workloads to maintain intensity without signs/symptoms of physical distress.  Continue to progress workloads to maintain intensity without signs/symptoms of physical distress.   Average METs  -  1.79  -  1.85  1.92     Resistance Training   Training Prescription  -  Yes  -  Yes  Yes   Weight  -  2 lb  -  2 lbs  2 lbs   Reps  -  10-15  -  10-15  10-15     Interval Training   Interval Training  -  No  -  No  No     Treadmill   MPH  -  1  -  1  1   Grade  -  0  -  0  0   Minutes  -  1  -  15  15   METs  -  1.77  -  1.77  1.77     NuStep   Level  -  1  -  1  4   Minutes  -  15  -  15  15   METs  -  1.8  -  1.8  2     Biostep-RELP   Level  -  -  -  1  1   Minutes  -  -  -  15  15   METs  -  -  -  2  2     Home Exercise Plan   Plans to continue exercise at  -  -  [facility] (comment) water exercise   [employer] [facility] (comment) water exercise  [facility]    Frequency  -  -  Add 1 additional day to program exercise sessions.  Add 1 additional day to program exercise sessions.  Add 1 additional day to program exercise sessions.   Initial Home Exercises Provided  -  -  01/29/18  01/29/18  01/29/18      Exercise Comments: Exercise Comments    Row Name 01/15/18 0909 01/29/18 1036         Exercise Comments   First full day of exercise!  Patient was oriented to gym and equipment including functions, settings, policies, and procedures.  Patient's individual exercise prescription and treatment plan were reviewed.  All starting workloads were established based on the results of the 6 minute walk test done at initial orientation visit.  The plan for exercise progression was also introduced and progression will be customized based on patient's performance and goals.  Reviewed home exercise with pt today.  Pt plans to do water exercise for exercise.  Reviewed THR, pulse, RPE, sign and symptoms, NTG use, and when to call 911 or MD.  Also discussed weather considerations and indoor options.  Pt voiced understanding.         Exercise Goals and Review: Exercise Goals    Row Name 01/03/18 1419             Exercise Goals   Increase Physical Activity  Yes       Intervention  Provide advice, education, support and counseling about physical activity/exercise needs.;Develop an individualized exercise prescription for aerobic and resistive training based on initial evaluation findings, risk stratification, comorbidities and participant's personal goals.       Expected Outcomes  Short Term: Attend rehab on a regular basis to increase amount of physical activity.;Long Term: Add in home exercise to make exercise part of routine and to increase amount of physical activity.;Long Term: Exercising regularly at least 3-5 days a week.       Increase Strength and Stamina  Yes       Intervention  Provide advice, education, support and counseling  about physical activity/exercise needs.;Develop an individualized exercise prescription for aerobic and resistive training based on initial evaluation findings, risk stratification, comorbidities and participant's personal goals.       Expected Outcomes  Short Term: Increase workloads from initial exercise prescription for resistance, speed, and METs.;Short Term: Perform resistance training exercises routinely during rehab and add in resistance training at home;Long Term: Improve cardiorespiratory fitness, muscular endurance and strength as measured by increased METs and functional capacity (6MWT)       Able to understand and use rate of perceived exertion (RPE) scale  Yes       Intervention  Provide education and explanation on how to use RPE scale       Expected Outcomes  Short Term: Able to use RPE daily in rehab to express subjective intensity level;Long Term:  Able to use RPE to guide intensity level when exercising independently       Able to understand and use Dyspnea scale  Yes       Intervention  Provide education and explanation on how to use Dyspnea scale       Expected Outcomes  Short Term: Able to use Dyspnea scale daily in rehab to express subjective sense of shortness of breath during exertion;Long Term: Able to use Dyspnea scale to guide intensity level when exercising independently       Knowledge and understanding of Target Heart Rate  Range (THRR)  Yes       Intervention  Provide education and explanation of THRR including how the numbers were predicted and where they are located for reference       Expected Outcomes  Short Term: Able to state/look up THRR;Long Term: Able to use THRR to govern intensity when exercising independently;Short Term: Able to use daily as guideline for intensity in rehab       Able to check pulse independently  Yes       Intervention  Provide education and demonstration on how to check pulse in carotid and radial arteries.;Review the importance of being able  to check your own pulse for safety during independent exercise       Expected Outcomes  Short Term: Able to explain why pulse checking is important during independent exercise;Long Term: Able to check pulse independently and accurately       Understanding of Exercise Prescription  Yes       Intervention  Provide education, explanation, and written materials on patient's individual exercise prescription       Expected Outcomes  Short Term: Able to explain program exercise prescription;Long Term: Able to explain home exercise prescription to exercise independently          Exercise Goals Re-Evaluation : Exercise Goals Re-Evaluation    Row Name 01/15/18 0908 01/29/18 1036 02/12/18 1558         Exercise Goal Re-Evaluation   Exercise Goals Review  Understanding of Exercise Prescription;Knowledge and understanding of Target Heart Rate Range (THRR);Able to understand and use rate of perceived exertion (RPE) scale  Increase Physical Activity;Increase Strength and Stamina;Able to understand and use rate of perceived exertion (RPE) scale;Knowledge and understanding of Target Heart Rate Range (THRR);Able to check pulse independently;Understanding of Exercise Prescription  Increase Physical Activity;Understanding of Exercise Prescription;Increase Strength and Stamina     Comments  Reviewed RPE scale, THR and program prescription with pt today.  Pt voiced understanding and was given a copy of goals to take home.   Hilda Blades wants to try water exercise and her Dr recommended it as well. We discussed different pool options and safety.  Jackelyn Poling has been doing well in rehab.  She is now up to level 4 on the NuStep.  We will increase her workload on the BioStep at next visit.  We will also encourage her to increase her speed on the treadmill.  We will continue to monitor her progression.      Expected Outcomes  Short: Use RPE daily to regulate intensity.  Long: Follow program prescription in THR.  Short - Jackelyn Poling will add  1 day outside program sessions  Long - Jackelyn Poling will maintain exercise on her own  Short: Increase BioStep and speed on treadmill.  Long: Add in one extra day at home consistently.         Discharge Exercise Prescription (Final Exercise Prescription Changes): Exercise Prescription Changes - 02/12/18 1600      Response to Exercise   Blood Pressure (Admit)  126/62    Blood Pressure (Exercise)  136/60    Blood Pressure (Exit)  116/70    Heart Rate (Admit)  59 bpm    Heart Rate (Exercise)  99 bpm    Heart Rate (Exit)  76 bpm    Rating of Perceived Exertion (Exercise)  13    Symptoms  fatigue    Duration  Continue with 30 min of aerobic exercise without signs/symptoms of physical distress.    Intensity  THRR  unchanged      Progression   Progression  Continue to progress workloads to maintain intensity without signs/symptoms of physical distress.    Average METs  1.92      Resistance Training   Training Prescription  Yes    Weight  2 lbs    Reps  10-15      Interval Training   Interval Training  No      Treadmill   MPH  1    Grade  0    Minutes  15    METs  1.77      NuStep   Level  4    Minutes  15    METs  2      Biostep-RELP   Level  1    Minutes  15    METs  2      Home Exercise Plan   Plans to continue exercise at  [facility] (comment) water exercise     Frequency  Add 1 additional day to program exercise sessions.    Initial Home Exercises Provided  01/29/18       Nutrition:  Target Goals: Understanding of nutrition guidelines, daily intake of sodium <1531m, cholesterol <2059m calories 30% from fat and 7% or less from saturated fats, daily to have 5 or more servings of fruits and vegetables.  Biometrics: Pre Biometrics - 01/03/18 1419      Pre Biometrics   Height  _0  (1.6 m)    Weight  249 lb 14.4 oz (113.4 kg)    Waist Circumference  43 inches    Hip Circumference  57 inches    Waist to Hip Ratio  0.75 %    BMI (Calculated)  44.28         Nutrition Therapy Plan and Nutrition Goals: Nutrition Therapy & Goals - 01/03/18 1513      Intervention Plan   Intervention  Prescribe, educate and counsel regarding individualized specific dietary modifications aiming towards targeted core components such as weight, hypertension, lipid management, diabetes, heart failure and other comorbidities.;Nutrition handout(s) given to patient.    Expected Outcomes  Short Term Goal: Understand basic principles of dietary content, such as calories, fat, sodium, cholesterol and nutrients.;Short Term Goal: A plan has been developed with personal nutrition goals set during dietitian appointment.;Long Term Goal: Adherence to prescribed nutrition plan.       Nutrition Assessments: Nutrition Assessments - 01/03/18 1353      MEDFICTS Scores   Pre Score  15       Nutrition Goals Re-Evaluation: Nutrition Goals Re-Evaluation    Row Name 02/05/18 1058             Goals   Nutrition Goal  Meet with dietcian       Comment  Has appointment scheduled for next Thursday afternoons       Expected Outcome  Short: Meet  with dietician  Long: Continue to follow recommendations.           Nutrition Goals Discharge (Final Nutrition Goals Re-Evaluation): Nutrition Goals Re-Evaluation - 02/05/18 1058      Goals   Nutrition Goal  Meet with dietcian    Comment  Has appointment scheduled for next Thursday afternoons    Expected Outcome  Short: Meet  with dietician  Long: Continue to follow recommendations.        Psychosocial: Target Goals: Acknowledge presence or absence of significant depression and/or stress, maximize coping skills, provide positive support system. Participant is able  to verbalize types and ability to use techniques and skills needed for reducing stress and depression.   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  Yes husband      Screening Interventions   Interventions  Encouraged to exercise;Program counselor consult;To provide support and resources with identified psychosocial needs;Provide feedback about the scores to participant    Expected Outcomes  Short Term goal: Utilizing psychosocial counselor, staff and physician to assist with identification of specific Stressors or current issues interfering with healing process. Setting desired goal for each stressor or current issue identified.;Long Term Goal: Stressors or current issues are controlled or eliminated.;Long Term goal: The participant improves quality of Life and PHQ9 Scores as seen by post scores and/or verbalization of changes;Short Term goal: Identification and review with participant of any Quality of Life or Depression concerns found by scoring the questionnaire.       Quality of Life Scores:  Quality of Life - 01/03/18 1509      Quality of Life Scores   Health/Function Pre  20.03 %    Socioeconomic Pre  26.5 %    Psych/Spiritual Pre  26.79 %    Family Pre  28.8 %    GLOBAL Pre  24.04 %      Scores of 19 and below usually indicate a poorer quality of life in these areas.  A difference of  2-3 points is a clinically meaningful difference.  A difference of 2-3 points in the total score of the Quality of Life Index has been associated with significant improvement in overall quality of life, self-image, physical symptoms, and general health in studies assessing change in quality of life.  PHQ-9: Recent Review Flowsheet Data    Depression screen [facility] 2/9 01/03/2018 04/25/2017 02/29/2016   Decreased Interest 0 0 0   Down, Depressed, Hopeless 0 0 0   PHQ - 2 Score 0 0 0   Altered sleeping 0 0 -   Tired, decreased energy 3 0 -   Change in appetite 0 0 -    Feeling bad or failure about yourself  0 0 -   Trouble concentrating 0 0 -   Moving slowly or fidgety/restless 0 0 -   Suicidal thoughts 0 0 -   PHQ-9 Score 3 0 -   Difficult doing work/chores Not difficult at all - -     Interpretation of Total Score  Total Score Depression Severity:  1-4 = Minimal depression, 5-9 = Mild depression, 10-14 = Moderate depression, 15-19 = Moderately severe depression, 20-27 = Severe depression   Psychosocial Evaluation and Intervention: Psychosocial Evaluation - 01/31/18 0943      Psychosocial Evaluation & Interventions   Interventions  Encouraged to exercise with the program and follow exercise prescription;Relaxation education;Stress management education    Comments  Counselor met with Ms. Tobin Chad Jackelyn Poling) today for initial psychosocial evaluation.   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  She has a strong support system with a spouse of 72 years; a daughter and son locally and a son in [place].  Jackelyn Poling has multiple health issues with bone spurs/scoliosis; and hip replacement ~4 years ago.  She sleeps well and has a good appetite. Jackelyn Poling denies a history of depression or anxiety or any current symptoms.  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  She is working 7 days/week in the meantime.  Jackelyn Poling has goals to be more active overall and is looking into a water aerobics program due to her joint pain.  She will be followed by staff.    Expected Outcomes  Short - Jackelyn Poling will practice positive self care with relaxation and boundaries around her time during tax season.  Long - Debbie will exercise consistently to improve her health and mental health/manage stress.    Continue Psychosocial Services   Follow up required by staff       Psychosocial Re-Evaluation:   Psychosocial Discharge (Final Psychosocial  Re-Evaluation):   Vocational Rehabilitation: Provide vocational rehab assistance to qualifying candidates.   Vocational Rehab Evaluation & Intervention: Vocational Rehab - 01/03/18 1511      Initial Vocational Rehab Evaluation & Intervention   Assessment shows need for Vocational Rehabilitation  No       Education: Education Goals: Education classes will be provided on a variety of topics geared toward better understanding of heart health and risk factor modification. Participant will state understanding/return demonstration of topics presented as noted by education test scores.  Learning Barriers/Preferences: Learning Barriers/Preferences - 01/03/18 1513      Learning Barriers/Preferences   Learning Barriers  None    Learning Preferences  Individual Instruction       Education Topics:  AED/CPR: - Group verbal and written instruction with the use of models to demonstrate the basic use of the AED with the basic ABC's of resuscitation.   Cardiac Rehab from 02/19/2018 in [facility] Cardiac and Pulmonary Rehab  Date  01/31/18  Educator  CE  Instruction Review Code  1- Verbalizes Understanding      General Nutrition Guidelines/Fats and Fiber: -Group instruction provided by verbal, written material, models and posters to present the general guidelines for heart healthy nutrition. Gives an explanation and review of dietary fats and fiber.   Controlling Sodium/Reading Food Labels: -Group verbal and written material supporting the discussion of sodium use in heart healthy nutrition. Review and explanation with models, verbal and written materials for utilization of the food label.   Cardiac Rehab from 02/19/2018 in [facility] Cardiac and Pulmonary Rehab  Date  01/29/18  Educator  PI  Instruction Review Code  1- Verbalizes Understanding      Exercise Physiology & General Exercise Guidelines: - Group verbal and written instruction with models to review the exercise physiology of the  cardiovascular system and associated critical values. Provides general exercise guidelines with specific guidelines to those with heart or lung disease.    Cardiac Rehab from 02/19/2018 in [facility] Cardiac and Pulmonary Rehab  Date  02/07/18  Educator  [facility]  Instruction Review Code  1- Verbalizes Understanding      Aerobic Exercise & Resistance Training: - Gives group verbal and written instruction on the various components of exercise. Focuses on aerobic and resistive training programs and the benefits of this training and how to safely progress through these programs..   Cardiac Rehab from 02/19/2018 in [facility] Cardiac and Pulmonary Rehab  Date  02/12/18  Educator  AS  Instruction Review Code  1- Verbalizes Understanding      Flexibility, Balance, Mind/Body Relaxation: Provides group verbal/written instruction on the benefits of flexibility and balance training, including mind/body exercise modes such as yoga, pilates and tai chi.  Demonstration and skill practice provided.   Cardiac Rehab from 02/19/2018 in [facility] Cardiac and Pulmonary Rehab  Date  02/19/18  Educator  AS  Instruction Review Code  1- Verbalizes Understanding      Stress and Anxiety: - Provides group verbal and written instruction about the health risks of elevated stress and causes of high stress.  Discuss the correlation between heart/lung disease and anxiety and treatment options. Review healthy ways to manage with stress and anxiety.   Depression: - Provides group verbal and written instruction on the correlation between heart/lung disease and depressed mood, treatment options, and the stigmas associated with seeking treatment.   Cardiac Rehab from 02/19/2018 in [facility] Cardiac and Pulmonary Rehab  Date  02/14/18  Educator  [facility]  Instruction Review Code  1- Verbalizes Understanding      Anatomy & Physiology of the Heart: - Group verbal and written instruction and models provide basic cardiac anatomy and physiology, with the  coronary electrical and arterial systems. Review of Valvular disease and Heart Failure   Cardiac Rehab from 02/19/2018 in [facility] Cardiac and Pulmonary Rehab  Date  01/17/18  Educator  CE  Instruction Review Code  1- Verbalizes Understanding      Cardiac Procedures: - Group verbal and written instruction to review commonly prescribed medications for heart disease. Reviews the medication, class of the drug, and side effects. Includes the steps to properly store meds and maintain the prescription regimen. (beta blockers and nitrates)   Cardiac Rehab from 02/19/2018 in [facility] Cardiac and Pulmonary Rehab  Date  01/24/18  Educator  CE  Instruction Review Code  1- Verbalizes Understanding      Cardiac Medications I: - Group verbal and written instruction to review commonly prescribed medications for heart disease. Reviews the medication, class of the drug, and side effects. Includes the steps to properly store meds and maintain the prescription regimen.   Cardiac Medications II: -Group verbal and written instruction to review commonly prescribed medications for heart disease. Reviews the medication, class of the drug, and side effects. (all other drug classes)    Go Sex-Intimacy & Heart Disease, Get SMART - Goal Setting: - Group verbal and written instruction through game format to discuss heart disease and the return to sexual intimacy. Provides group verbal and written material to discuss and apply goal setting through the application of the S.M.A.R.T. Method.   Cardiac Rehab from 02/19/2018 in [facility] Cardiac and Pulmonary Rehab  Date  01/24/18  Educator  CE  Instruction Review Code  1- Verbalizes Understanding      Other Matters of the Heart: - Provides group verbal, written materials and models to describe Stable Angina and Peripheral Artery. Includes description of the disease process and treatment options available to the cardiac patient.   Exercise & Equipment Safety: - Individual  verbal instruction and demonstration of equipment use and safety with use of the equipment.   Cardiac Rehab from 02/19/2018 in [facility] Cardiac and Pulmonary Rehab  Date  01/03/18  Educator  [facility]  Instruction Review Code  1- Verbalizes Understanding      Infection Prevention: - Provides verbal and written material to individual with discussion of infection control including proper hand washing and proper equipment cleaning during exercise session.   Cardiac Rehab from 02/19/2018 in [facility]  Cardiac and Pulmonary Rehab  Date  01/03/18  Educator  [facility]  Instruction Review Code  1- Verbalizes Understanding      Falls Prevention: - Provides verbal and written material to individual with discussion of falls prevention and safety.   Cardiac Rehab from 02/19/2018 in [facility] Cardiac and Pulmonary Rehab  Date  01/03/18  Educator  [facility]  Instruction Review Code  1- Verbalizes Understanding      Diabetes: - Individual verbal and written instruction to review signs/symptoms of diabetes, desired ranges of glucose level fasting, after meals and with exercise. Acknowledge that pre and post exercise glucose checks will be done for 3 sessions at entry of program.   Know Your Numbers and Risk Factors: -Group verbal and written instruction about important numbers in your health.  Discussion of what are risk factors and how they play a role in the disease process.  Review of Cholesterol, Blood Pressure, Diabetes, and BMI and the role they play in your overall health.   Sleep Hygiene: -Provides group verbal and written instruction about how sleep can affect your health.  Define sleep hygiene, discuss sleep cycles and impact of sleep habits. Review good sleep hygiene tips.    Cardiac Rehab from 02/19/2018 in [facility] Cardiac and Pulmonary Rehab  Date  01/15/18  Educator  [facility]  Instruction Review Code  1- Verbalizes Understanding      Other: -Provides group and verbal instruction on various topics (see  comments)   Knowledge Questionnaire Score: Knowledge Questionnaire Score - 01/03/18 1510      Knowledge Questionnaire Score   Pre Score  26/28       Core Components/Risk Factors/Patient Goals at Admission: Personal Goals and Risk Factors at Admission - 01/03/18 1352      Core Components/Risk Factors/Patient Goals on Admission    Weight Management  Yes;Obesity;Weight Loss    Intervention  Weight Management: Develop a combined nutrition and exercise program designed to reach desired caloric intake, while maintaining appropriate intake of nutrient and fiber, sodium and fats, and appropriate energy expenditure required for the weight goal.;Weight Management: Provide education and appropriate resources to help participant work on and attain dietary goals.;Weight Management/Obesity: Establish reasonable short term and long term weight goals.;Obesity: Provide education and appropriate resources to help participant work on and attain dietary goals.    Admit Weight  250 lb (113.4 kg)    Goal Weight: Short Term  245 lb (111.1 kg)    Goal Weight: Long Term  200 lb (90.7 kg)    Expected Outcomes  Short Term: Continue to assess and modify interventions until short term weight is achieved;Long Term: Adherence to nutrition and physical activity/exercise program aimed toward attainment of established weight goal    Heart Failure  Yes    Intervention  Provide a combined exercise and nutrition program that is supplemented with education, support and counseling about heart failure. Directed toward relieving symptoms such as shortness of breath, decreased exercise tolerance, and extremity edema.    Expected Outcomes  Improve functional capacity of life;Short term: Attendance in program 2-3 days a week with increased exercise capacity. Reported lower sodium intake. Reported increased fruit and vegetable intake. Reports medication compliance.;Short term: Daily weights obtained and reported for increase. Utilizing  diuretic protocols set by physician.;Long term: Adoption of self-care skills and reduction of barriers for early signs and symptoms recognition and intervention leading to self-care maintenance.    Hypertension  Yes    Intervention  Provide education on lifestyle modifcations including regular physical  activity/exercise, weight management, moderate sodium restriction and increased consumption of fresh fruit, vegetables, and low fat dairy, alcohol moderation, and smoking cessation.;Monitor prescription use compliance.    Expected Outcomes  Short Term: Continued assessment and intervention until BP is < 140/84m HG in hypertensive participants. < 130/881mHG in hypertensive participants with diabetes, heart failure or chronic kidney disease.;Long Term: Maintenance of blood pressure at goal levels.    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 Teach and support self-help strategies.;Refer participants experiencing significant psychosocial distress to appropriate mental health specialists for further evaluation and treatment. When possible, include family members and significant others in education/counseling sessions.    Expected Outcomes  Long Term: Emotional wellbeing is indicated by absence of clinically significant psychosocial distress or social isolation.;Short Term: Participant demonstrates changes in health-related behavior, relaxation and other stress management skills, ability to obtain effective social support, and compliance with psychotropic medications if prescribed.       Core Components/Risk Factors/Patient Goals Review:  Goals and Risk Factor Review    Row Name 01/31/18 0941             Core Components/Risk Factors/Patient Goals Review   Personal Goals Review  Weight  Management/Obesity;Lipids;Hypertension       Review  DeJackelyn Polings feeling better overall since beginning exercise.  She says even other people have noticed she seems better.  She started taking Tylenol before exercise to help with back pain.  Her goal is to be able to do water aerobics when she finishes HT.  She is scheduled to meet with RD 4/16       Expected Outcomes  Short - DeJackelyn Polingill continue to attend HT and will meet with RD  Long - Debbie will exercise independently          Core Components/Risk Factors/Patient Goals at Discharge (Final Review):  Goals and Risk Factor Review - 01/31/18 0941      Core Components/Risk Factors/Patient Goals Review   Personal Goals Review  Weight Management/Obesity;Lipids;Hypertension    Review  DeJackelyn Polings feeling better overall since beginning exercise.  She says even other people have noticed she seems better.  She started taking Tylenol before exercise to help with back pain.  Her goal is to be able to do water aerobics when she finishes HT.  She is scheduled to meet with RD 4/16    Expected Outcomes  Short - DeJackelyn Polingill continue to attend HT and will meet with RD  Long - Debbie will exercise independently       ITP Comments: ITP Comments    Row Name 01/03/18 1347 01/23/18 0631 02/20/18 0547       ITP Comments  Med Review completed. Initial ITP created. Diagnosis can be found in Care Everywhere 11/15/17  30 Day review. Continue with ITP unless directed changes per Medical Director review.   New to program  30 day review. Continue with ITP unless directed changes per Medical Director        Comments:

## 2018-02-26 ENCOUNTER — Encounter: Payer: BC Managed Care – PPO | Admitting: *Deleted

## 2018-02-26 DIAGNOSIS — Z48812 Encounter for surgical aftercare following surgery on the circulatory system: Secondary | ICD-10-CM | POA: Diagnosis not present

## 2018-02-26 DIAGNOSIS — Z952 Presence of prosthetic heart valve: Secondary | ICD-10-CM

## 2018-02-26 NOTE — Progress Notes (Signed)
Daily Session Note  Patient Details  Name: Victoria Davila MRN: [id number] Date of Birth: 09-02-1956 Referring Provider:     Cardiac Rehab from 01/03/2018 in [facility] Cardiac and Pulmonary Rehab  Referring Provider  [name]      Encounter Date: 02/26/2018  Check In: Session Check In - 02/26/18 0919      Check-In   Location  [facility]-Cardiac & Pulmonary Rehab    Staff Present  Heath Lark, RN, BSN, CCRP;Korissa Horsford [place], MA, RCEP, CCRP, Exercise Physiologist;Amanda Oletta Darter, [place], ACSM CEP, Exercise Physiologist    Supervising physician immediately available to respond to emergencies  See telemetry face sheet for immediately available ER MD    Medication changes reported      Yes    Comments  decreased diuretic from 4 a day to 2 a day    Fall or balance concerns reported     No    Warm-up and Cool-down  Performed on first and last piece of equipment    Resistance Training Performed  Yes    VAD Patient?  No      Pain Assessment   Currently in Pain?  No/denies          Social History   Tobacco Use  Smoking Status Never Smoker  Smokeless Tobacco Never Used    Goals Met:  Independence with exercise equipment Exercise tolerated well Personal goals reviewed No report of cardiac concerns or symptoms Strength training completed today  Goals Unmet:  Not Applicable  Comments: Pt able to follow exercise prescription today without complaint.  Will continue to monitor for progression. See ITP for goal review.    Dr. Emily Filbert is Medical Director for [place] and [facility].

## 2018-02-28 ENCOUNTER — Encounter: Payer: BC Managed Care – PPO | Attending: Family Medicine

## 2018-02-28 DIAGNOSIS — G709 Myoneural disorder, unspecified: Secondary | ICD-10-CM | POA: Diagnosis not present

## 2018-02-28 DIAGNOSIS — Z79899 Other long term (current) drug therapy: Secondary | ICD-10-CM | POA: Insufficient documentation

## 2018-02-28 DIAGNOSIS — I10 Essential (primary) hypertension: Secondary | ICD-10-CM | POA: Insufficient documentation

## 2018-02-28 DIAGNOSIS — M069 Rheumatoid arthritis, unspecified: Secondary | ICD-10-CM | POA: Diagnosis not present

## 2018-02-28 DIAGNOSIS — I4891 Unspecified atrial fibrillation: Secondary | ICD-10-CM | POA: Insufficient documentation

## 2018-02-28 DIAGNOSIS — Z48812 Encounter for surgical aftercare following surgery on the circulatory system: Secondary | ICD-10-CM | POA: Insufficient documentation

## 2018-02-28 DIAGNOSIS — E785 Hyperlipidemia, unspecified: Secondary | ICD-10-CM | POA: Diagnosis not present

## 2018-02-28 DIAGNOSIS — Z952 Presence of prosthetic heart valve: Secondary | ICD-10-CM | POA: Diagnosis not present

## 2018-02-28 DIAGNOSIS — Z7901 Long term (current) use of anticoagulants: Secondary | ICD-10-CM | POA: Insufficient documentation

## 2018-02-28 NOTE — Progress Notes (Signed)
Daily Session Note  Patient Details  Name: Victoria Davila MRN: [id number] Date of Birth: [birth year] Referring Provider:     Cardiac Rehab from 01/03/2018 in [facility] Cardiac and Pulmonary Rehab  Referring Provider  [name]      Encounter Date: 02/28/2018  Check In: Session Check In - 02/28/18 0837      Check-In   Location  [facility]-Cardiac & Pulmonary Rehab    Staff Present  Alberteen Sam, MA, RCEP, CCRP, Exercise Physiologist;Olivette Beckmann Oletta Darter, BA, ACSM CEP, Exercise Physiologist;Carroll Enterkin, RN, BSN    Supervising physician immediately available to respond to emergencies  See telemetry face sheet for immediately available ER MD    Medication changes reported      No    Fall or balance concerns reported     No    Warm-up and Cool-down  Performed on first and last piece of equipment    Resistance Training Performed  Yes    VAD Patient?  No      Pain Assessment   Currently in Pain?  No/denies    Multiple Pain Sites  No          Social History   Tobacco Use  Smoking Status Never Smoker  Smokeless Tobacco Never Used    Goals Met:  Independence with exercise equipment Exercise tolerated well No report of cardiac concerns or symptoms Strength training completed today  Goals Unmet:  Not Applicable  Comments: Pt able to follow exercise prescription today without complaint.  Will continue to monitor for progression.    Dr. Emily Filbert is Medical Director for [place] and [facility].

## 2018-03-01 ENCOUNTER — Ambulatory Visit (INDEPENDENT_AMBULATORY_CARE_PROVIDER_SITE_OTHER): Payer: BC Managed Care – PPO | Admitting: Emergency Medicine

## 2018-03-01 DIAGNOSIS — I050 Rheumatic mitral stenosis: Secondary | ICD-10-CM

## 2018-03-01 LAB — POCT INR
INR: 2.6
PT: 31.3

## 2018-03-01 NOTE — Patient Instructions (Signed)
Description   6 mg daily. Follow up in 2 weeks.      

## 2018-03-05 DIAGNOSIS — Z952 Presence of prosthetic heart valve: Secondary | ICD-10-CM

## 2018-03-05 DIAGNOSIS — Z48812 Encounter for surgical aftercare following surgery on the circulatory system: Secondary | ICD-10-CM | POA: Diagnosis not present

## 2018-03-05 NOTE — Progress Notes (Signed)
Daily Session Note  Patient Details  Name: Victoria Davila MRN: [id number] Date of Birth: 19-Sep-1956 Referring Provider:     Cardiac Rehab from 01/03/2018 in [facility] Cardiac and Pulmonary Rehab  Referring Provider  [name]      Encounter Date: 03/05/2018  Check In: Session Check In - 03/05/18 0829      Check-In   Location  [facility]-Cardiac & Pulmonary Rehab    Staff Present  Alberteen Sam, MA, RCEP, CCRP, Exercise Physiologist;Amanda Oletta Darter, BA, ACSM CEP, Exercise Physiologist;Susanne Bice, RN, BSN, CCRP    Supervising physician immediately available to respond to emergencies  See telemetry face sheet for immediately available ER MD    Medication changes reported      No    Fall or balance concerns reported     No    Warm-up and Cool-down  Performed on first and last piece of equipment    Resistance Training Performed  Yes    VAD Patient?  No      Pain Assessment   Currently in Pain?  No/denies    Multiple Pain Sites  No          Social History   Tobacco Use  Smoking Status Never Smoker  Smokeless Tobacco Never Used    Goals Met:  Independence with exercise equipment Exercise tolerated well No report of cardiac concerns or symptoms Strength training completed today  Goals Unmet:  Not Applicable  Comments: Pt able to follow exercise prescription today without complaint.  Will continue to monitor for progression.    Dr. Emily Filbert is Medical Director for [place] and [facility].

## 2018-03-07 ENCOUNTER — Encounter: Payer: BC Managed Care – PPO | Admitting: *Deleted

## 2018-03-07 ENCOUNTER — Encounter: Payer: Self-pay | Admitting: Dietician

## 2018-03-07 VITALS — Ht 63.0 in | Wt 249.2 lb

## 2018-03-07 DIAGNOSIS — Z48812 Encounter for surgical aftercare following surgery on the circulatory system: Secondary | ICD-10-CM | POA: Diagnosis not present

## 2018-03-07 DIAGNOSIS — Z952 Presence of prosthetic heart valve: Secondary | ICD-10-CM

## 2018-03-07 NOTE — Progress Notes (Signed)
Daily Session Note  Patient Details  Name: Victoria Davila MRN: [id number] Date of Birth: [birth year] Referring Provider:     Cardiac Rehab from 01/03/2018 in [facility] Cardiac and Pulmonary Rehab  Referring Provider  [name]      Encounter Date: 03/07/2018  Check In: Session Check In - 03/07/18 0823      Check-In   Location  [facility]-Cardiac & Pulmonary Rehab    Staff Present  Alberteen Sam, MA, RCEP, CCRP, Exercise Physiologist;Amanda Oletta Darter, BA, ACSM CEP, Exercise Physiologist;Carroll Enterkin, RN, BSN    Supervising physician immediately available to respond to emergencies  See telemetry face sheet for immediately available ER MD    Medication changes reported      No    Fall or balance concerns reported     No    Warm-up and Cool-down  Performed on first and last piece of equipment    Resistance Training Performed  Yes    VAD Patient?  No          Social History   Tobacco Use  Smoking Status Never Smoker  Smokeless Tobacco Never Used    Goals Met:  Independence with exercise equipment Exercise tolerated well No report of cardiac concerns or symptoms Strength training completed today  Goals Unmet:  Not Applicable  Comments: Pt able to follow exercise prescription today without complaint.  Will continue to monitor for progression.    Dr. Emily Filbert is Medical Director for [place] and [facility].

## 2018-03-12 DIAGNOSIS — Z48812 Encounter for surgical aftercare following surgery on the circulatory system: Secondary | ICD-10-CM | POA: Diagnosis not present

## 2018-03-12 DIAGNOSIS — Z952 Presence of prosthetic heart valve: Secondary | ICD-10-CM

## 2018-03-12 NOTE — Progress Notes (Signed)
Daily Session Note  Patient Details  Name: Victoria Davila MRN: [id number] Date of Birth: 1956/10/21 Referring Provider:     Cardiac Rehab from 01/03/2018 in [facility] Cardiac and Pulmonary Rehab  Referring Provider  [name]      Encounter Date: 03/12/2018  Check In: Session Check In - 03/12/18 0913      Check-In   Location  [facility]-Cardiac & Pulmonary Rehab    Staff Present  Heath Lark, RN, BSN, CCRP;Jessica Arroyo, MA, RCEP, CCRP, Exercise Physiologist;Amanda Oletta Darter, [place], ACSM CEP, Exercise Physiologist    Supervising physician immediately available to respond to emergencies  See telemetry face sheet for immediately available ER MD    Medication changes reported      No    Fall or balance concerns reported     No    Warm-up and Cool-down  Performed on first and last piece of equipment    Resistance Training Performed  Yes    VAD Patient?  No      Pain Assessment   Currently in Pain?  No/denies    Multiple Pain Sites  No          Social History   Tobacco Use  Smoking Status Never Smoker  Smokeless Tobacco Never Used    Goals Met:  Independence with exercise equipment Exercise tolerated well No report of cardiac concerns or symptoms Strength training completed today  Goals Unmet:  Not Applicable  Comments: Pt able to follow exercise prescription today without complaint.  Will continue to monitor for progression.    Dr. Emily Filbert is Medical Director for [place] and [facility].

## 2018-03-14 DIAGNOSIS — Z952 Presence of prosthetic heart valve: Secondary | ICD-10-CM

## 2018-03-14 DIAGNOSIS — Z48812 Encounter for surgical aftercare following surgery on the circulatory system: Secondary | ICD-10-CM | POA: Diagnosis not present

## 2018-03-14 NOTE — Progress Notes (Signed)
Daily Session Note  Patient Details  Name: Victoria Davila MRN: [id number] Date of Birth: June 29, 1956 Referring Provider:     Cardiac Rehab from 01/03/2018 in [facility] Cardiac and Pulmonary Rehab  Referring Provider  [name]      Encounter Date: 03/14/2018  Check In: Session Check In - 03/14/18 0853      Check-In   Location  [facility]-Cardiac & Pulmonary Rehab    Staff Present  Gerlene Burdock, RN, BSN;Jessica Luan Pulling, MA, RCEP, CCRP, Exercise Physiologist;Amanda Oletta Darter, [place], ACSM CEP, Exercise Physiologist    Supervising physician immediately available to respond to emergencies  See telemetry face sheet for immediately available ER MD    Medication changes reported      No    Fall or balance concerns reported     No    Warm-up and Cool-down  Performed on first and last piece of equipment    Resistance Training Performed  Yes    VAD Patient?  No      Pain Assessment   Currently in Pain?  No/denies    Multiple Pain Sites  No          Social History   Tobacco Use  Smoking Status Never Smoker  Smokeless Tobacco Never Used    Goals Met:  Independence with exercise equipment Exercise tolerated well No report of cardiac concerns or symptoms Strength training completed today  Goals Unmet:  Not Applicable  Comments: Pt able to follow exercise prescription today without complaint.  Will continue to monitor for progression.    Dr. Emily Filbert is Medical Director for [place] and [facility].

## 2018-03-19 ENCOUNTER — Encounter: Payer: BC Managed Care – PPO | Admitting: *Deleted

## 2018-03-19 DIAGNOSIS — Z952 Presence of prosthetic heart valve: Secondary | ICD-10-CM

## 2018-03-19 DIAGNOSIS — Z48812 Encounter for surgical aftercare following surgery on the circulatory system: Secondary | ICD-10-CM | POA: Diagnosis not present

## 2018-03-19 NOTE — Progress Notes (Signed)
Daily Session Note  Patient Details  Name: Victoria Davila MRN: [id number] Date of Birth: [birth year] Referring Provider:     Cardiac Rehab from 01/03/2018 in [facility] Cardiac and Pulmonary Rehab  Referring Provider  [name]      Encounter Date: 03/19/2018  Check In: Session Check In - 03/19/18 0832      Check-In   Location  [facility]-Cardiac & Pulmonary Rehab    Staff Present  Heath Lark, RN, BSN, CCRP;Jemima Petko Luan Pulling, MA, RCEP, CCRP, Exercise Physiologist;Amanda Oletta Darter, [place], ACSM CEP, Exercise Physiologist    Supervising physician immediately available to respond to emergencies  See telemetry face sheet for immediately available ER MD    Medication changes reported      No    Fall or balance concerns reported     No    Warm-up and Cool-down  Performed on first and last piece of equipment    Resistance Training Performed  Yes    VAD Patient?  No      Pain Assessment   Currently in Pain?  No/denies          Social History   Tobacco Use  Smoking Status Never Smoker  Smokeless Tobacco Never Used    Goals Met:  Independence with exercise equipment Exercise tolerated well No report of cardiac concerns or symptoms Strength training completed today  Goals Unmet:  Not Applicable  Comments: Pt able to follow exercise prescription today without complaint.  Will continue to monitor for progression.    Dr. Emily Filbert is Medical Director for [place] and [facility].

## 2018-03-20 ENCOUNTER — Encounter: Payer: Self-pay | Admitting: *Deleted

## 2018-03-20 DIAGNOSIS — Z952 Presence of prosthetic heart valve: Secondary | ICD-10-CM

## 2018-03-20 NOTE — Patient Instructions (Signed)
Discharge Patient Instructions  Patient Details  Name: Victoria Davila MRN: [id number] Date of Birth: 05-Aug-1956 Referring Provider:  No ref. provider found   Number of Visits: 36/36  Reason for Discharge:  Patient reached a stable level of exercise. Patient independent in their exercise. Patient has met program and personal goals.  Smoking History:  Social History   Tobacco Use  Smoking Status Never Smoker  Smokeless Tobacco Never Used    Diagnosis:  S/P mitral valve replacement  Initial Exercise Prescription: Initial Exercise Prescription - 01/03/18 1400      Date of Initial Exercise RX and Referring Provider   Date  01/03/18    Referring Provider  [name]      Treadmill   MPH  1    Grade  0    Minutes  15    METs  1.5      NuStep   Level  1    SPM  80    Minutes  15    METs  1.5      Biostep-RELP   Level  1    SPM  50    Minutes  15    METs  1.5      Track   Laps  11    Minutes  15    METs  1.5      Prescription Details   Frequency (times per week)  3    Duration  Progress to 30 minutes of continuous aerobic without signs/symptoms of physical distress      Intensity   THRR 40-80% of Max Heartrate  116-144    Ratings of Perceived Exertion  11-15    Perceived Dyspnea  0-4      Resistance Training   Training Prescription  Yes    Weight  2 lb    Reps  10-15       Discharge Exercise Prescription (Final Exercise Prescription Changes): Exercise Prescription Changes - 03/12/18 1400      Response to Exercise   Blood Pressure (Admit)  132/74    Blood Pressure (Exercise)  142/68    Blood Pressure (Exit)  112/70    Heart Rate (Admit)  72 bpm    Heart Rate (Exercise)  103 bpm    Heart Rate (Exit)  85 bpm    Rating of Perceived Exertion (Exercise)  15    Symptoms  fatigue    Duration  Continue with 30 min of aerobic exercise without signs/symptoms of physical distress.    Intensity  THRR unchanged      Progression   Progression   Continue to progress workloads to maintain intensity without signs/symptoms of physical distress.    Average METs  2.06      Resistance Training   Training Prescription  Yes    Weight  2 lbs    Reps  10-15      Interval Training   Interval Training  No      Treadmill   MPH  1    Grade  0    Minutes  15    METs  1.77      NuStep   Level  4    Minutes  15    METs  2.4      Biostep-RELP   Level  1    Minutes  15    METs  2      Home Exercise Plan   Plans to continue exercise at  [facility] (comment) water exercise  Frequency  Add 1 additional day to program exercise sessions.    Initial Home Exercises Provided  01/29/18       Functional Capacity: 6 Minute Walk    Row Name 01/03/18 1420 03/07/18 0950       6 Minute Walk   Phase  -  Discharge    Distance  600 feet  900 feet    Distance % Change  -  50 %    Distance Feet Change  -  300 ft    Walk Time  4.5 minutes  6 minutes    # of Rest Breaks  2  0    MPH  1.5  1.7    METS  1.5  2.08    RPE  15  15    Perceived Dyspnea   2  -    VO2 Peak  5.12  7.3    Symptoms  Yes (comment)  No    Comments  leg tightness (quads) 5/10 - afraid of falling   back pain, leg pain 5/10    Resting HR  88 bpm  69 bpm    Resting BP  118/84  124/80    Resting Oxygen Saturation   99 %  -    Exercise Oxygen Saturation  during 6 min walk  95 %  -    Max Ex. HR  123 bpm  101 bpm    Max Ex. BP  138/88  180/88    2 Minute Post BP  136/84  -       Quality of Life: Quality of Life - 03/14/18 1201      Quality of Life Scores   Health/Function Pre  20.03 %    Health/Function Post  15.07 %    Health/Function % Change  -24.76 %    Socioeconomic Pre  26.5 %    Socioeconomic Post  25.57 %    Socioeconomic % Change   -3.51 %    Psych/Spiritual Pre  26.79 %    Psych/Spiritual Post  22.14 %    Psych/Spiritual % Change  -17.36 %    Family Pre  28.8 %    Family Post  25.2 %    Family % Change  -12.5 %    GLOBAL Pre  24.04 %     GLOBAL Post  20.33 %    GLOBAL % Change  -15.43 %       Personal Goals: Goals established at orientation with interventions provided to work toward goal. Personal Goals and Risk Factors at Admission - 01/03/18 1352      Core Components/Risk Factors/Patient Goals on Admission    Weight Management  Yes;Obesity;Weight Loss    Intervention  Weight Management: Develop a combined nutrition and exercise program designed to reach desired caloric intake, while maintaining appropriate intake of nutrient and fiber, sodium and fats, and appropriate energy expenditure required for the weight goal.;Weight Management: Provide education and appropriate resources to help participant work on and attain dietary goals.;Weight Management/Obesity: Establish reasonable short term and long term weight goals.;Obesity: Provide education and appropriate resources to help participant work on and attain dietary goals.    Admit Weight  250 lb (113.4 kg)    Goal Weight: Short Term  245 lb (111.1 kg)    Goal Weight: Long Term  200 lb (90.7 kg)    Expected Outcomes  Short Term: Continue to assess and modify interventions until short term weight is achieved;Long Term: Adherence to nutrition and physical activity/exercise  program aimed toward attainment of established weight goal    Heart Failure  Yes    Intervention  Provide a combined exercise and nutrition program that is supplemented with education, support and counseling about heart failure. Directed toward relieving symptoms such as shortness of breath, decreased exercise tolerance, and extremity edema.    Expected Outcomes  Improve functional capacity of life;Short term: Attendance in program 2-3 days a week with increased exercise capacity. Reported lower sodium intake. Reported increased fruit and vegetable intake. Reports medication compliance.;Short term: Daily weights obtained and reported for increase. Utilizing diuretic protocols set by physician.;Long term:  Adoption of self-care skills and reduction of barriers for early signs and symptoms recognition and intervention leading to self-care maintenance.    Hypertension  Yes    Intervention  Provide education on lifestyle modifcations including regular physical activity/exercise, weight management, moderate sodium restriction and increased consumption of fresh fruit, vegetables, and low fat dairy, alcohol moderation, and smoking cessation.;Monitor prescription use compliance.    Expected Outcomes  Short Term: Continued assessment and intervention until BP is < 140/86m HG in hypertensive participants. < 130/839mHG in hypertensive participants with diabetes, heart failure or chronic kidney disease.;Long Term: Maintenance of blood pressure at goal levels.    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 Teach and support self-help strategies.;Refer participants experiencing significant psychosocial distress to appropriate mental health specialists for further evaluation and treatment. When possible, include family members and significant others in education/counseling sessions.    Expected Outcomes  Long Term: Emotional wellbeing is indicated by absence of clinically significant psychosocial distress or social isolation.;Short Term: Participant demonstrates changes in health-related behavior, relaxation and other stress management skills, ability to obtain effective social support, and compliance with psychotropic medications if prescribed.        Personal Goals Discharge: Goals and Risk Factor Review - 02/26/18 0923      Core Components/Risk Factors/Patient Goals Review   Personal Goals Review  Weight Management/Obesity;Lipids;Hypertension    Review  DeJackelyn Polingas been doing well in rehab.  Her weight was doing  better, but since her medicaiton change last week her weight has gone back up and she has edema in her ankles and feet.  She was encouraged to contact her doctor about her swelling.  Her doctor thought she was doing better which was why he pulled some of her meds back.  Also, since her blood pressures have been good, she stopped checking them at home.  We talked about continuing to check her blood pressures and keeping up with them still.  She has not had any lipid work done since December.     Expected Outcomes  Short: Continue to work on weight and call doctor about swelling.  Long: Continue to monitor risk factors.        Exercise Goals and Review: Exercise Goals    Row Name 01/03/18 1419             Exercise Goals   Increase Physical Activity  Yes       Intervention  Provide advice, education, support and counseling about physical activity/exercise needs.;Develop an individualized exercise prescription for aerobic and resistive training based on initial evaluation findings, risk stratification, comorbidities and participant's personal goals.       Expected Outcomes  Short Term: Attend  rehab on a regular basis to increase amount of physical activity.;Long Term: Add in home exercise to make exercise part of routine and to increase amount of physical activity.;Long Term: Exercising regularly at least 3-5 days a week.       Increase Strength and Stamina  Yes       Intervention  Provide advice, education, support and counseling about physical activity/exercise needs.;Develop an individualized exercise prescription for aerobic and resistive training based on initial evaluation findings, risk stratification, comorbidities and participant's personal goals.       Expected Outcomes  Short Term: Increase workloads from initial exercise prescription for resistance, speed, and METs.;Short Term: Perform resistance training exercises routinely during rehab and add in resistance training at home;Long Term:  Improve cardiorespiratory fitness, muscular endurance and strength as measured by increased METs and functional capacity (6MWT)       Able to understand and use rate of perceived exertion (RPE) scale  Yes       Intervention  Provide education and explanation on how to use RPE scale       Expected Outcomes  Short Term: Able to use RPE daily in rehab to express subjective intensity level;Long Term:  Able to use RPE to guide intensity level when exercising independently       Able to understand and use Dyspnea scale  Yes       Intervention  Provide education and explanation on how to use Dyspnea scale       Expected Outcomes  Short Term: Able to use Dyspnea scale daily in rehab to express subjective sense of shortness of breath during exertion;Long Term: Able to use Dyspnea scale to guide intensity level when exercising independently       Knowledge and understanding of Target Heart Rate Range (THRR)  Yes       Intervention  Provide education and explanation of THRR including how the numbers were predicted and where they are located for reference       Expected Outcomes  Short Term: Able to state/look up THRR;Long Term: Able to use THRR to govern intensity when exercising independently;Short Term: Able to use daily as guideline for intensity in rehab       Able to check pulse independently  Yes       Intervention  Provide education and demonstration on how to check pulse in carotid and radial arteries.;Review the importance of being able to check your own pulse for safety during independent exercise       Expected Outcomes  Short Term: Able to explain why pulse checking is important during independent exercise;Long Term: Able to check pulse independently and accurately       Understanding of Exercise Prescription  Yes       Intervention  Provide education, explanation, and written materials on patient's individual exercise prescription       Expected Outcomes  Short Term: Able to explain program exercise  prescription;Long Term: Able to explain home exercise prescription to exercise independently          Nutrition & Weight - Outcomes: Pre Biometrics - 01/03/18 1419      Pre Biometrics   Height  '5\' 3"'$  (1.6 m)    Weight  249 lb 14.4 oz (113.4 kg)    Waist Circumference  43 inches    Hip Circumference  57 inches    Waist to Hip Ratio  0.75 %    BMI (Calculated)  44.28      Post Biometrics - 03/07/18  1610       Post  Biometrics   Height  '5\' 3"'$  (1.6 m)    Weight  249 lb 3.2 oz (113 kg)    Waist Circumference  43 inches    Hip Circumference  57 inches    Waist to Hip Ratio  0.75 %    BMI (Calculated)  44.15       Nutrition: Nutrition Therapy & Goals - 03/07/18 1027      Nutrition Therapy   Diet  DASH    Drug/Food Interactions  Coumadin/Vit K handout on coumadin and diet guidelines provided    Protein (specify units)  7-8oz    Fiber  25 grams    Whole Grain Foods  3 servings    Saturated Fats  12 max. grams    Fruits and Vegetables  5 servings/day 8 ideal. eats fruits but not a large variety of vegetables    Sodium  2000 grams monitors sodium d/t HTN and chronic LE edema      Personal Nutrition Goals   Nutrition Goal  While on coumadin, monitor your intake of foods high in Vitamin K. Try to stick around the portion sizes of 1/2 cup cooked or 1 cup raw daily, and consume around the same amounts each day    Personal Goal #2  Work to reduce the frequency of eating meals out. Use the strategies discussed to order meals lower in calories and sodium, such as looking up nutrition information ahead of time or ordering sauces on the side    Personal Goal #3  Increase the variety of vegetables you consume; focus on more non-starch vegetables. Look to include sources of vegetables daily    Comments  She and her husband are learning to read nutrition facts labels to identify low/high sodium foods. They are also trying to be more consious of thier food choices and to reduce total sodium  intake      Intervention Plan   Intervention  Prescribe, educate and counsel regarding individualized specific dietary modifications aiming towards targeted core components such as weight, hypertension, lipid management, diabetes, heart failure and other comorbidities.;Nutrition handout(s) given to patient. Following a low sodium diet, coumadin info sheet, salt-free seasoning guide    Expected Outcomes  Short Term Goal: Understand basic principles of dietary content, such as calories, fat, sodium, cholesterol and nutrients.;Short Term Goal: A plan has been developed with personal nutrition goals set during dietitian appointment.;Long Term Goal: Adherence to prescribed nutrition plan.       Nutrition Discharge: Nutrition Assessments - 03/14/18 1203      MEDFICTS Scores   Pre Score  15    Post Score  19    Score Difference  4       Education Questionnaire Score: Knowledge Questionnaire Score - 03/14/18 1201      Knowledge Questionnaire Score   Pre Score  26/28    Post Score  27/28       Goals reviewed with patient; copy given to patient.

## 2018-03-20 NOTE — Progress Notes (Signed)
Cardiac Individual Treatment Plan  Patient Details  Name: Victoria Davila MRN: [id number] Date of Birth: [birth year] Referring Provider:     Cardiac Rehab from 01/03/2018 in [facility] Cardiac and Pulmonary Rehab  Referring Provider  [name]      Initial Encounter Date:    Cardiac Rehab from 01/03/2018 in [facility] Cardiac and Pulmonary Rehab  Date  01/03/18  Referring Provider  [name]      Visit Diagnosis: S/P mitral valve replacement  Patient's Home Medications on Admission:  Current Outpatient Medications:  .  acetaminophen (TYLENOL) 500 MG tablet, Take 1,000 mg by mouth every 6 (six) hours as needed for mild pain or moderate pain., Disp: , Rfl:  .  aspirin 81 MG chewable tablet, Chew 81 mg by mouth daily., Disp: , Rfl:  .  DILANTIN 100 MG ER capsule, Take 4 capsules (400 mg total) by mouth daily., Disp: 360 capsule, Rfl: 3 .  folic acid (FOLVITE) 1 MG tablet, Take 1 mg by mouth daily., Disp: , Rfl:  .  gabapentin (NEURONTIN) 100 MG capsule, Take 100 mg by mouth 3 (three) times daily., Disp: , Rfl:  .  metoprolol succinate (TOPROL-XL) 25 MG 24 hr tablet, Take 25 mg by mouth daily., Disp: , Rfl:  .  Multiple Vitamin (MULTIVITAMIN WITH MINERALS) TABS tablet, Take 1 tablet by mouth at bedtime., Disp: , Rfl:  .  Omega-3 Fatty Acids (FISH OIL) 1200 MG CAPS, Take 1 capsule by mouth 2 (two) times daily. , Disp: , Rfl:  .  potassium chloride (K-DUR) 10 MEQ tablet, Take 3 tablets (30 mEq total) by mouth 2 (two) times daily., Disp: 180 tablet, Rfl: 1 .  psyllium (REGULOID) 0.52 G capsule, Take 0.52 g by mouth 2 (two) times daily., Disp: , Rfl:  .  torsemide (DEMADEX) 20 MG tablet, Take 2 tablets (40 mg total) by mouth 2 (two) times daily., Disp: 60 tablet, Rfl: 4 .  valACYclovir (VALTREX) 1000 MG tablet, Take 1 tablet (1,000 mg total) by mouth 2 (two) times daily. For one day., Disp: 60 tablet, Rfl: 5 .  warfarin (COUMADIN) 3 MG tablet, Take 2 tablets (6 mg total) by mouth daily., Disp: 60 tablet,  Rfl: 5 .  warfarin (COUMADIN) 4 MG tablet, TAKE 1 TABLET EVERY DAY WITH A 3MG TABLET, Disp: 30 tablet, Rfl: 5 .  warfarin (COUMADIN) 5 MG tablet, Take 1 tablet (5 mg total) by mouth daily., Disp: 30 tablet, Rfl: 3 .  warfarin (COUMADIN) 6 MG tablet, Take 1 tablet (6 mg total) by mouth daily., Disp: 30 tablet, Rfl: 12  Past Medical History: Past Medical History:  Diagnosis Date  . Dysrhythmia    A FIB / FOLLOWED BY DR [place]  . Edema of both legs    CHRONIC  . Hyperlipidemia   . Hypertension   . Mitral valve disorder    "deformed"  . Neuromuscular disorder ([place])   . Rheumatoid arthritis ([place])    RECENT DX - HAS NOT YET SEEN RHEUMATOLOGIST  . Seizures ([place])    LAST SEIZURE JAN 1995  . Shortness of breath dyspnea    WITH EXERTION    Tobacco Use: Social History   Tobacco Use  Smoking Status Never Smoker  Smokeless Tobacco Never Used    Labs: Recent Review Flowsheet Data    Labs for ITP Cardiac and Pulmonary Rehab Latest Ref Rng & Units 09/28/2014 03/01/2016 05/25/2017 10/05/2017   Cholestrol <200 mg/dL - 287(H) 266(H) 259(H)   LDLCALC mg/dL (calc) -  183(H) 154(H) 161(H)   HDL >50 mg/dL - 68 80 68   Trlycerides <150 mg/dL - 178(H) 161(H) 155(H)   Hemoglobin A1c 4.8 - 5.6 % 5.7 5.6 - -       Exercise Target Goals:    Exercise Program Goal: Individual exercise prescription set using results from initial 6 min walk test and THRR while considering  patient's activity barriers and safety.   Exercise Prescription Goal: Initial exercise prescription builds to 30-45 minutes a day of aerobic activity, 2-3 days per week.  Home exercise guidelines will be given to patient during program as part of exercise prescription that the participant will acknowledge.  Activity Barriers & Risk Stratification: Activity Barriers & Cardiac Risk Stratification - 01/03/18 1508      Activity Barriers & Cardiac Risk Stratification   Activity Barriers  Arthritis;Back Problems;Joint  Problems;Left Knee Replacement;Incisional Pain;Shortness of Breath    Cardiac Risk Stratification  Moderate       6 Minute Walk: 6 Minute Walk    Row Name 01/03/18 1420 03/07/18 0950       6 Minute Walk   Phase  -  Discharge    Distance  600 feet  900 feet    Distance % Change  -  50 %    Distance Feet Change  -  300 ft    Walk Time  4.5 minutes  6 minutes    # of Rest Breaks  2  0    MPH  1.5  1.7    METS  1.5  2.08    RPE  15  15    Perceived Dyspnea   2  -    VO2 Peak  5.12  7.3    Symptoms  Yes (comment)  No    Comments  leg tightness (quads) 5/10 - afraid of falling   back pain, leg pain 5/10    Resting HR  88 bpm  69 bpm    Resting BP  118/84  124/80    Resting Oxygen Saturation   99 %  -    Exercise Oxygen Saturation  during 6 min walk  95 %  -    Max Ex. HR  123 bpm  101 bpm    Max Ex. BP  138/88  180/88    2 Minute Post BP  136/84  -       Oxygen Initial Assessment:   Oxygen Re-Evaluation:   Oxygen Discharge (Final Oxygen Re-Evaluation):   Initial Exercise Prescription: Initial Exercise Prescription - 01/03/18 1400      Date of Initial Exercise RX and Referring Provider   Date  01/03/18    Referring Provider  [name]      Treadmill   MPH  1    Grade  0    Minutes  15    METs  1.5      NuStep   Level  1    SPM  80    Minutes  15    METs  1.5      Biostep-RELP   Level  1    SPM  50    Minutes  15    METs  1.5      Track   Laps  11    Minutes  15    METs  1.5      Prescription Details   Frequency (times per week)  3    Duration  Progress to 30 minutes of continuous aerobic without signs/symptoms of  physical distress      Intensity   THRR 40-80% of Max Heartrate  116-144    Ratings of Perceived Exertion  11-15    Perceived Dyspnea  0-4      Resistance Training   Training Prescription  Yes    Weight  2 lb    Reps  10-15       Perform Capillary Blood Glucose checks as needed.  Exercise Prescription Changes: Exercise  Prescription Changes    Row Name 01/03/18 1400 01/15/18 1500 01/29/18 1000 01/30/18 1400 02/12/18 1600     Response to Exercise   Blood Pressure (Admit)  118/84  122/70  -  122/64  126/62   Blood Pressure (Exercise)  138/88  132/74  -  142/68  136/60   Blood Pressure (Exit)  136/84  114/60  -  134/80  116/70   Heart Rate (Admit)  97 bpm  77 bpm  -  89 bpm  59 bpm   Heart Rate (Exercise)  121 bpm  103 bpm  -  113 bpm  99 bpm   Heart Rate (Exit)  107 bpm  86 bpm  -  115 bpm  76 bpm   Oxygen Saturation (Admit)  99 %  -  -  -  -   Oxygen Saturation (Exit)  95 %  -  -  -  -   Rating of Perceived Exertion (Exercise)  15  15  -  12  13   Symptoms  -  fatigue  -  fatigue  fatigue   Comments  -  first full day of exercise  -  -  -   Duration  -  Progress to 30 minutes of  aerobic without signs/symptoms of physical distress  -  Continue with 30 min of aerobic exercise without signs/symptoms of physical distress.  Continue with 30 min of aerobic exercise without signs/symptoms of physical distress.   Intensity  -  THRR unchanged  -  THRR unchanged  THRR unchanged     Progression   Progression  -  Continue to progress workloads to maintain intensity without signs/symptoms of physical distress.  -  Continue to progress workloads to maintain intensity without signs/symptoms of physical distress.  Continue to progress workloads to maintain intensity without signs/symptoms of physical distress.   Average METs  -  1.79  -  1.85  1.92     Resistance Training   Training Prescription  -  Yes  -  Yes  Yes   Weight  -  2 lb  -  2 lbs  2 lbs   Reps  -  10-15  -  10-15  10-15     Interval Training   Interval Training  -  No  -  No  No     Treadmill   MPH  -  1  -  1  1   Grade  -  0  -  0  0   Minutes  -  1  -  15  15   METs  -  1.77  -  1.77  1.77     NuStep   Level  -  1  -  1  4   Minutes  -  15  -  15  15   METs  -  1.8  -  1.8  2     Biostep-RELP   Level  -  -  -  1  1  Minutes  -  -  -  15   15   METs  -  -  -  2  2     Home Exercise Plan   Plans to continue exercise at  -  -  [employer] (comment) water exercise   [employer] (comment) water exercise   [employer] (comment) water exercise    Frequency  -  -  Add 1 additional day to program exercise sessions.  Add 1 additional day to program exercise sessions.  Add 1 additional day to program exercise sessions.   Initial Home Exercises Provided  -  -  01/29/18  01/29/18  01/29/18   Row Name 02/26/18 1500 03/12/18 1400           Response to Exercise   Blood Pressure (Admit)  126/60  132/74      Blood Pressure (Exercise)  132/74  142/68      Blood Pressure (Exit)  126/58  112/70      Heart Rate (Admit)  86 bpm  72 bpm      Heart Rate (Exercise)  89 bpm  103 bpm      Heart Rate (Exit)  79 bpm  85 bpm      Rating of Perceived Exertion (Exercise)  13  15      Symptoms  fatigue  fatigue      Duration  Continue with 30 min of aerobic exercise without signs/symptoms of physical distress.  Continue with 30 min of aerobic exercise without signs/symptoms of physical distress.      Intensity  THRR unchanged  THRR unchanged        Progression   Progression  Continue to progress workloads to maintain intensity without signs/symptoms of physical distress.  Continue to progress workloads to maintain intensity without signs/symptoms of physical distress.      Average METs  2.4  2.06        Resistance Training   Training Prescription  Yes  Yes      Weight  2 lbs  2 lbs      Reps  10-15  10-15        Interval Training   Interval Training  No  No        Treadmill   MPH  - has not been doing treadmill since hurting her back  1      Grade  -  0      Minutes  -  15      METs  -  1.77        NuStep   Level  4  4      Minutes  15  15      METs  2.6  2.4        Biostep-RELP   Level  1  1      Minutes  15  15      METs  2  2        Home Exercise Plan   Plans to continue exercise at  [employer]  (comment) water exercise   [employer] (comment) water exercise       Frequency  Add 1 additional day to program exercise sessions.  Add 1 additional day to program exercise sessions.      Initial Home Exercises Provided  01/29/18  01/29/18         Exercise Comments: Exercise Comments    Row Name 01/15/18 [phone] 01/29/18 1036 03/12/18 0914  Exercise Comments   First full day of exercise!  Patient was oriented to gym and equipment including functions, settings, policies, and procedures.  Patient's individual exercise prescription and treatment plan were reviewed.  All starting workloads were established based on the results of the 6 minute walk test done at initial orientation visit.  The plan for exercise progression was also introduced and progression will be customized based on patient's performance and goals.  Reviewed home exercise with pt today.  Pt plans to do water exercise for exercise.  Reviewed THR, pulse, RPE, sign and symptoms, NTG use, and when to call 911 or MD.  Also discussed weather considerations and indoor options.  Pt voiced understanding.  Victoria Davila asked about having roof of mouth and side of neck pain for 2 days after her walk test and once after TM.  After discussing with staff, symptoms were most likely muscle soreness.   Staff explained the difference in angina pain and DOMS.  Victoria Davila was instructued to call her Dr if s/s persist.        Exercise Goals and Review: Exercise Goals    Row Name 01/03/18 1419             Exercise Goals   Increase Physical Activity  Yes       Intervention  Provide advice, education, support and counseling about physical activity/exercise needs.;Develop an individualized exercise prescription for aerobic and resistive training based on initial evaluation findings, risk stratification, comorbidities and participant's personal goals.       Expected Outcomes  Short Term: Attend rehab on a regular basis to increase amount of physical  activity.;Long Term: Add in home exercise to make exercise part of routine and to increase amount of physical activity.;Long Term: Exercising regularly at least 3-5 days a week.       Increase Strength and Stamina  Yes       Intervention  Provide advice, education, support and counseling about physical activity/exercise needs.;Develop an individualized exercise prescription for aerobic and resistive training based on initial evaluation findings, risk stratification, comorbidities and participant's personal goals.       Expected Outcomes  Short Term: Increase workloads from initial exercise prescription for resistance, speed, and METs.;Short Term: Perform resistance training exercises routinely during rehab and add in resistance training at home;Long Term: Improve cardiorespiratory fitness, muscular endurance and strength as measured by increased METs and functional capacity (6MWT)       Able to understand and use rate of perceived exertion (RPE) scale  Yes       Intervention  Provide education and explanation on how to use RPE scale       Expected Outcomes  Short Term: Able to use RPE daily in rehab to express subjective intensity level;Long Term:  Able to use RPE to guide intensity level when exercising independently       Able to understand and use Dyspnea scale  Yes       Intervention  Provide education and explanation on how to use Dyspnea scale       Expected Outcomes  Short Term: Able to use Dyspnea scale daily in rehab to express subjective sense of shortness of breath during exertion;Long Term: Able to use Dyspnea scale to guide intensity level when exercising independently       Knowledge and understanding of Target Heart Rate Range (THRR)  Yes       Intervention  Provide education and explanation of THRR including how the numbers were predicted and where they  are located for reference       Expected Outcomes  Short Term: Able to state/look up THRR;Long Term: Able to use THRR to govern intensity  when exercising independently;Short Term: Able to use daily as guideline for intensity in rehab       Able to check pulse independently  Yes       Intervention  Provide education and demonstration on how to check pulse in carotid and radial arteries.;Review the importance of being able to check your own pulse for safety during independent exercise       Expected Outcomes  Short Term: Able to explain why pulse checking is important during independent exercise;Long Term: Able to check pulse independently and accurately       Understanding of Exercise Prescription  Yes       Intervention  Provide education, explanation, and written materials on patient's individual exercise prescription       Expected Outcomes  Short Term: Able to explain program exercise prescription;Long Term: Able to explain home exercise prescription to exercise independently          Exercise Goals Re-Evaluation : Exercise Goals Re-Evaluation    Row Name 01/15/18 0908 01/29/18 1036 02/12/18 1558 02/26/18 0920 03/12/18 1405     Exercise Goal Re-Evaluation   Exercise Goals Review  Understanding of Exercise Prescription;Knowledge and understanding of Target Heart Rate Range (THRR);Able to understand and use rate of perceived exertion (RPE) scale  Increase Physical Activity;Increase Strength and Stamina;Able to understand and use rate of perceived exertion (RPE) scale;Knowledge and understanding of Target Heart Rate Range (THRR);Able to check pulse independently;Understanding of Exercise Prescription  Increase Physical Activity;Understanding of Exercise Prescription;Increase Strength and Stamina  Increase Physical Activity;Understanding of Exercise Prescription;Increase Strength and Stamina  Increase Physical Activity;Understanding of Exercise Prescription;Increase Strength and Stamina   Comments  Reviewed RPE scale, THR and program prescription with pt today.  Pt voiced understanding and was given a copy of goals to take home.   Hilda Blades  wants to try water exercise and her Dr recommended it as well. We discussed different pool options and safety.  Victoria Davila has been doing well in rehab.  She is now up to level 4 on the NuStep.  We will increase her workload on the BioStep at next visit.  We will also encourage her to increase her speed on the treadmill.  We will continue to monitor her progression.   Victoria Davila continues to do well in rehab.  She has been doing her exercise, but just not enough and she would like to do more.  She has been doing some walking and uses weight and bands at home.  Her whole routine takes about 20 minutes.  We talked about adding in more time at home.  She needs to aim for 66mn of walking alone in additon to bands and weights.  She was agreeable to trying to increase her weights.  She had also expressed interest in water aerobics but has not joined the pool yet.  [place].  [facility] into pool, she was willing to look into this until center reopens.   DJackelyn Polingwill be graduating soon!!  She improved her post 6MWT by [id number]!!  She is planning to continue to exercise by walking at home.  We have continued to encourage her to seek out exercise in a pool if possible.  We will continue to monitor her  progression.   Expected Outcomes  Short: Use RPE daily to regulate intensity.  Long: Follow program prescription in THR.  Short - Victoria Davila will add 1 day outside program sessions  Long - Victoria Davila will maintain exercise on her own  Short: Increase BioStep and speed on treadmill.  Long: Add in one extra day at home consistently.   Short: Increase exercise time to 58mn and look into [facility] pool.  Long: Add in more exercise at home.   Short: Try to increase treadmill and graduate!  Long: Continue to exercise independently.       Discharge Exercise Prescription (Final Exercise Prescription Changes): Exercise Prescription Changes - 03/12/18  1400      Response to Exercise   Blood Pressure (Admit)  132/74    Blood Pressure (Exercise)  142/68    Blood Pressure (Exit)  112/70    Heart Rate (Admit)  72 bpm    Heart Rate (Exercise)  103 bpm    Heart Rate (Exit)  85 bpm    Rating of Perceived Exertion (Exercise)  15    Symptoms  fatigue    Duration  Continue with 30 min of aerobic exercise without signs/symptoms of physical distress.    Intensity  THRR unchanged      Progression   Progression  Continue to progress workloads to maintain intensity without signs/symptoms of physical distress.    Average METs  2.06      Resistance Training   Training Prescription  Yes    Weight  2 lbs    Reps  10-15      Interval Training   Interval Training  No      Treadmill   MPH  1    Grade  0    Minutes  15    METs  1.77      NuStep   Level  4    Minutes  15    METs  2.4      Biostep-RELP   Level  1    Minutes  15    METs  2      Home Exercise Plan   [facility]     Frequency  Add 1 additional day to program exercise sessions.    Initial Home Exercises Provided  01/29/18       Nutrition:  Target Goals: Understanding of nutrition guidelines, daily intake of sodium <1506m cholesterol <20070mcalories 30% from fat and 7% or less from saturated fats, daily to have 5 or more servings of fruits and vegetables.  Biometrics: Pre Biometrics - 01/03/18 1419      Pre Biometrics   Height  _0  (1.6 m)    Weight  249 lb 14.4 oz (113.4 kg)    Waist Circumference  43 inches    Hip Circumference  57 inches    Waist to Hip Ratio  0.75 %    BMI (Calculated)  44.28      Post Biometrics - 03/07/18 0951       Post  Biometrics   Height  _1  (1.6 m)    Weight  249 lb 3.2 oz (113 kg)    Waist Circumference  43 inches    Hip Circumference  57 inches    Waist to Hip Ratio  0.75 %    BMI (Calculated)  44.15       Nutrition Therapy Plan and Nutrition  Goals: Nutrition Therapy & Goals - 03/07/18 1027  Nutrition Therapy   Diet  DASH    Drug/Food Interactions  Coumadin/Vit K handout on coumadin and diet guidelines provided    Protein (specify units)  7-8oz    Fiber  25 grams    Whole Grain Foods  3 servings    Saturated Fats  12 max. grams    Fruits and Vegetables  5 servings/day 8 ideal. eats fruits but not a large variety of vegetables    Sodium  2000 grams monitors sodium d/t HTN and chronic LE edema      Personal Nutrition Goals   Nutrition Goal  While on coumadin, monitor your intake of foods high in Vitamin K. Try to stick around the portion sizes of 1/2 cup cooked or 1 cup raw daily, and consume around the same amounts each day    Personal Goal #2  Work to reduce the frequency of eating meals out. Use the strategies discussed to order meals lower in calories and sodium, such as looking up nutrition information ahead of time or ordering sauces on the side    Personal Goal #3  Increase the variety of vegetables you consume; focus on more non-starch vegetables. Look to include sources of vegetables daily    Comments  She and her husband are learning to read nutrition facts labels to identify low/high sodium foods. They are also trying to be more consious of thier food choices and to reduce total sodium intake      Intervention Plan   Intervention  Prescribe, educate and counsel regarding individualized specific dietary modifications aiming towards targeted core components such as weight, hypertension, lipid management, diabetes, heart failure and other comorbidities.;Nutrition handout(s) given to patient. Following a low sodium diet, coumadin info sheet, salt-free seasoning guide    Expected Outcomes  Short Term Goal: Understand basic principles of dietary content, such as calories, fat, sodium, cholesterol and nutrients.;Short Term Goal: A plan has been developed with personal nutrition goals set during dietitian appointment.;Long Term  Goal: Adherence to prescribed nutrition plan.       Nutrition Assessments: Nutrition Assessments - 03/14/18 1203      MEDFICTS Scores   Pre Score  15    Post Score  19    Score Difference  4       Nutrition Goals Re-Evaluation: Nutrition Goals Re-Evaluation    Row Name 02/05/18 1058 02/26/18 0927 03/07/18 1033         Goals   Nutrition Goal  Meet with dietcian  Meet with dietcian  While on coumadin, monitor your intake of foods high in Vitamin K. Try to stick around the portion sizes of 1/2 cup cooked or 1 cup raw daily, and consume around the same amounts each day     Comment  Has appointment scheduled for next Thursday afternoons  Victoria Davila missed her appointment with dietician.  She has been rescheduled for 5/9 right after class so that she can give her full attention.  She still wants help with what to work on in her diet.   She would like to include salads in her diet to help her choose lower calorie meals, but has been avoiding all food with vitamin k     Expected Outcome  Short: Meet  with dietician  Long: Continue to follow recommendations.   Short: Meet  with dietician  Long: Continue to follow recommendations.   She will consume foods high in vitamin k in the amounts listed (1/2 cup - 1 cup) and try to consume around the  same amounts of these foods daily       Personal Goal #2 Re-Evaluation   Personal Goal #2  -  -  Work to reduce the frequency of eating meals out. Use strategies discussed to order meals lower in calories and sodium, such as looking up nutrition information ahead of time or ordering sauces on the side       Personal Goal #3 Re-Evaluation   Personal Goal #3  -  -  Increase the variety of vegetables you consume; focus on non-starchy vegetables. Look to include sources of vegetables daily        Nutrition Goals Discharge (Final Nutrition Goals Re-Evaluation): Nutrition Goals Re-Evaluation - 03/07/18 1033      Goals   Nutrition Goal  While on coumadin, monitor  your intake of foods high in Vitamin K. Try to stick around the portion sizes of 1/2 cup cooked or 1 cup raw daily, and consume around the same amounts each day    Comment  She would like to include salads in her diet to help her choose lower calorie meals, but has been avoiding all food with vitamin k    Expected Outcome  She will consume foods high in vitamin k in the amounts listed (1/2 cup - 1 cup) and try to consume around the same amounts of these foods daily      Personal Goal #2 Re-Evaluation   Personal Goal #2  Work to reduce the frequency of eating meals out. Use strategies discussed to order meals lower in calories and sodium, such as looking up nutrition information ahead of time or ordering sauces on the side      Personal Goal #3 Re-Evaluation   Personal Goal #3  Increase the variety of vegetables you consume; focus on non-starchy vegetables. Look to include sources of vegetables daily       Psychosocial: Target Goals: Acknowledge presence or absence of significant depression and/or stress, maximize coping skills, provide positive support system. Participant is able to verbalize types and ability to use techniques and skills needed for reducing stress and depression.   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  Yes husband      Screening Interventions   Interventions  Encouraged to exercise;Program counselor consult;To provide support and resources with identified psychosocial needs;Provide feedback about the scores to participant    Expected Outcomes  Short Term goal: Utilizing psychosocial counselor, staff and physician to assist with identification of specific Stressors or  current issues interfering with healing process. Setting desired goal for each stressor or current issue identified.;Long Term Goal: Stressors or current issues are controlled or eliminated.;Long Term goal: The participant improves quality of Life and PHQ9 Scores as seen by post scores and/or verbalization of changes;Short Term goal: Identification and review with participant of any Quality of Life or Depression concerns found by scoring the questionnaire.       Quality of Life Scores:  Quality of Life - 03/14/18 1201      Quality of Life Scores   Health/Function Pre  20.03 %    Health/Function Post  15.07 %  Health/Function % Change  -24.76 %    Socioeconomic Pre  26.5 %    Socioeconomic Post  25.57 %    Socioeconomic % Change   -3.51 %    Psych/Spiritual Pre  26.79 %    Psych/Spiritual Post  22.14 %    Psych/Spiritual % Change  -17.36 %    Family Pre  28.8 %    Family Post  25.2 %    Family % Change  -12.5 %    GLOBAL Pre  24.04 %    GLOBAL Post  20.33 %    GLOBAL % Change  -15.43 %      Scores of 19 and below usually indicate a poorer quality of life in these areas.  A difference of  2-3 points is a clinically meaningful difference.  A difference of 2-3 points in the total score of the Quality of Life Index has been associated with significant improvement in overall quality of life, self-image, physical symptoms, and general health in studies assessing change in quality of life.  PHQ-9: Recent Review Flowsheet Data    Depression screen [facility] 2/9 03/14/2018 01/03/2018 04/25/2017 02/29/2016   Decreased Interest 0 0 0 0   Down, Depressed, Hopeless 0 0 0 0   PHQ - 2 Score 0 0 0 0   Altered sleeping 0 0 0 -   Tired, decreased energy 1 3 0 -   Change in appetite 1 0 0 -   Feeling bad or failure about yourself  0 0 0 -   Trouble concentrating 0 0 0 -   Moving slowly or fidgety/restless 0 0 0 -   Suicidal thoughts 0 0 0 -   PHQ-9 Score 2 3 0 -   Difficult doing work/chores - Not  difficult at all - -     Interpretation of Total Score  Total Score Depression Severity:  1-4 = Minimal depression, 5-9 = Mild depression, 10-14 = Moderate depression, 15-19 = Moderately severe depression, 20-27 = Severe depression   Psychosocial Evaluation and Intervention: Psychosocial Evaluation - 01/31/18 0943      Psychosocial Evaluation & Interventions   Interventions  Encouraged to exercise with the program and follow exercise prescription;Relaxation education;Stress management education    Comments  Counselor met with Ms. Tobin Chad Victoria Davila) today for initial psychosocial evaluation.   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  She has a strong support system with a spouse of 29 years; a daughter and son locally and a son in [place].  Victoria Davila has multiple health issues with bone spurs/scoliosis; and hip replacement ~4 years ago.  She sleeps well and has a good appetite. Victoria Davila denies a history of depression or anxiety or any current symptoms.  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  She is working 7 days/week in the meantime.  Victoria Davila has goals to be more active overall and is looking into a water aerobics program due to her joint pain.  She will be followed by staff.    Expected Outcomes  Short - Victoria Davila will practice positive self care with relaxation and boundaries around her time during tax season.  Long - Victoria Davila will exercise consistently to improve her health and mental health/manage stress.    Continue Psychosocial Services   Follow up required by staff  Psychosocial Re-Evaluation: Psychosocial Re-Evaluation    Row Name 02/26/18 [phone]             Psychosocial Re-Evaluation   Current issues with  Current Stress Concerns       Comments  Victoria Davila is much happier now that tax season is over.  Her stress levels have now gone down  significantly!!  She continues to sleep well and is enjoying coming to class. She is going to look into going to [facility] to be able to go ahead and get into pool now.        Expected Outcomes  Short: Enjoy end of tax season!  Long: Look into getting into pool.        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  Her stress levels have now gone down significantly!!  She continues to sleep well and is enjoying coming to class. She is going to look into going to [facility] to be able to go ahead and get into pool now.     Expected Outcomes  Short: Enjoy end of tax season!  Long: Look into getting into pool.     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   Vocational Rehab Evaluation & Intervention: Vocational Rehab - 01/03/18 1511      Initial Vocational Rehab Evaluation & Intervention   Assessment shows need for Vocational Rehabilitation  No       Education: Education Goals: Education classes will be provided on a variety of topics geared toward better understanding of heart  health and risk factor modification. Participant will state understanding/return demonstration of topics presented as noted by education test scores.  Learning Barriers/Preferences: Learning Barriers/Preferences - 01/03/18 1513      Learning Barriers/Preferences   Learning Barriers  None    Learning Preferences  Individual Instruction       Education Topics:  AED/CPR: - Group verbal and written instruction with the use of models to demonstrate the basic use of the AED with the basic ABC's of resuscitation.   Cardiac Rehab from 03/19/2018 in [facility] Cardiac and Pulmonary Rehab  Date  01/31/18  Educator  CE  Instruction Review Code  1- Verbalizes Understanding      General Nutrition Guidelines/Fats and Fiber: -Group instruction provided by verbal, written material, models and posters to present the general guidelines for heart healthy nutrition. Gives an explanation and review of dietary fats and fiber.  Controlling Sodium/Reading Food Labels: -Group verbal and written material supporting the discussion of sodium use in heart healthy nutrition. Review and explanation with models, verbal and written materials for utilization of the food label.   Cardiac Rehab from 03/19/2018 in [facility] Cardiac and Pulmonary Rehab  Date  03/19/18  Educator  PI  Instruction Review Code  1- Verbalizes Understanding      Exercise Physiology & General Exercise Guidelines: - Group verbal and written instruction with models to review the exercise physiology of the cardiovascular system and associated critical values. Provides general exercise guidelines with specific guidelines to those with heart or lung disease.    Cardiac Rehab from 03/19/2018 in [facility] Cardiac and Pulmonary Rehab  Date  02/07/18  Educator  [facility]  Instruction Review Code  1- Verbalizes Understanding      Aerobic Exercise & Resistance Training: - Gives group verbal and written instruction on the various components of exercise. Focuses on  aerobic and resistive training programs and the benefits of this training and how to safely progress through these programs..   Cardiac Rehab from 03/19/2018 in [facility] Cardiac and Pulmonary Rehab  Date  02/12/18  Educator  AS  Instruction Review Code  1- Verbalizes Understanding      Flexibility, Balance, Mind/Body Relaxation: Provides group verbal/written instruction on the benefits of flexibility and balance training, including mind/body exercise modes such as yoga, pilates and tai chi.  Demonstration and skill practice provided.   Cardiac Rehab from 03/19/2018 in [facility] Cardiac and Pulmonary Rehab  Date  02/19/18  Educator  AS  Instruction Review Code  1- Verbalizes Understanding      Stress and Anxiety: - Provides group verbal and written instruction about the health risks of elevated stress and causes of high stress.  Discuss the correlation between heart/lung disease and anxiety and treatment options. Review healthy ways to manage with stress and anxiety.   Cardiac Rehab from 03/19/2018 in [facility] Cardiac and Pulmonary Rehab  Date  02/26/18  Educator  [facility]  Instruction Review Code  1- Verbalizes Understanding      Depression: - Provides group verbal and written instruction on the correlation between heart/lung disease and depressed mood, treatment options, and the stigmas associated with seeking treatment.   Cardiac Rehab from 03/19/2018 in [facility] Cardiac and Pulmonary Rehab  Date  02/14/18  Educator  [facility]  Instruction Review Code  1- Verbalizes Understanding      Anatomy & Physiology of the Heart: - Group verbal and written instruction and models provide basic cardiac anatomy and physiology, with the coronary electrical and arterial systems. Review of Valvular disease and Heart Failure   Cardiac Rehab from 03/19/2018 in [facility] Cardiac and Pulmonary Rehab  Date  02/28/18  Educator  CE  Instruction Review Code  1- Verbalizes Understanding      Cardiac Procedures: - Group verbal and  written instruction to review commonly prescribed medications for heart disease. Reviews the medication, class of the drug, and side effects. Includes the steps to properly store meds and maintain the prescription regimen. (beta blockers and nitrates)   Cardiac Rehab from 03/19/2018 in [facility] Cardiac and Pulmonary Rehab  Date  03/14/18  Educator  CE  Instruction Review Code  1- Verbalizes Understanding      Cardiac Medications I: - Group verbal and written instruction to review commonly prescribed medications for heart disease. Reviews the medication, class of the drug, and side effects. Includes the steps to properly store meds and maintain the prescription regimen.  Cardiac Rehab from 03/19/2018 in [facility] Cardiac and Pulmonary Rehab  Date  03/05/18  Educator  SB  Instruction Review Code  1- Verbalizes Understanding      Cardiac Medications II: -Group verbal and written instruction to review commonly prescribed medications for heart disease. Reviews the medication, class of the drug, and side effects. (all other drug classes)    Go Sex-Intimacy & Heart Disease, Get SMART - Goal Setting: - Group verbal and written instruction through game format to discuss heart disease and the return to sexual intimacy. Provides group verbal and written material to discuss and apply goal setting through the application of the S.M.A.R.T. Method.   Cardiac Rehab from 03/19/2018 in [facility] Cardiac and Pulmonary Rehab  Date  03/14/18  Educator  CE  Instruction Review Code  1- Verbalizes Understanding      Other Matters of the Heart: - Provides group verbal, written materials and models to describe Stable Angina and Peripheral Artery. Includes description of the disease process and treatment options available to the cardiac patient.   Exercise & Equipment Safety: - Individual verbal instruction and demonstration of equipment use and safety with use of the equipment.   Cardiac Rehab from 03/19/2018 in [facility]  Cardiac and Pulmonary Rehab  Date  01/03/18  Educator  [facility]  Instruction Review Code  1- Verbalizes Understanding      Infection Prevention: - Provides verbal and written material to individual with discussion of infection control including proper hand washing and proper equipment cleaning during exercise session.   Cardiac Rehab from 03/19/2018 in [facility] Cardiac and Pulmonary Rehab  Date  01/03/18  Educator  [facility]  Instruction Review Code  1- Verbalizes Understanding      Falls Prevention: - Provides verbal and written material to individual with discussion of falls prevention and safety.   Cardiac Rehab from 03/19/2018 in [facility] Cardiac and Pulmonary Rehab  Date  01/03/18  Educator  [facility]  Instruction Review Code  1- Verbalizes Understanding      Diabetes: - Individual verbal and written instruction to review signs/symptoms of diabetes, desired ranges of glucose level fasting, after meals and with exercise. Acknowledge that pre and post exercise glucose checks will be done for 3 sessions at entry of program.   Know Your Numbers and Risk Factors: -Group verbal and written instruction about important numbers in your health.  Discussion of what are risk factors and how they play a role in the disease process.  Review of Cholesterol, Blood Pressure, Diabetes, and BMI and the role they play in your overall health.   Sleep Hygiene: -Provides group verbal and written instruction about how sleep can affect your health.  Define sleep hygiene, discuss sleep cycles and impact of sleep habits. Review good sleep hygiene tips.    Cardiac Rehab from 03/19/2018 in [facility] Cardiac and Pulmonary Rehab  Date  03/12/18  Educator  [facility]  Instruction Review Code  1- Verbalizes Understanding      Other: -Provides group and verbal instruction on various topics (see comments)   Knowledge Questionnaire Score: Knowledge Questionnaire Score - 03/14/18 1201      Knowledge Questionnaire Score   Pre Score  26/28     Post Score  27/28       Core Components/Risk Factors/Patient Goals at Admission: Personal Goals and Risk Factors at Admission - 01/03/18 1352      Core Components/Risk Factors/Patient Goals on Admission    Weight Management  Yes;Obesity;Weight Loss    Intervention  Weight Management: Develop  a combined nutrition and exercise program designed to reach desired caloric intake, while maintaining appropriate intake of nutrient and fiber, sodium and fats, and appropriate energy expenditure required for the weight goal.;Weight Management: Provide education and appropriate resources to help participant work on and attain dietary goals.;Weight Management/Obesity: Establish reasonable short term and long term weight goals.;Obesity: Provide education and appropriate resources to help participant work on and attain dietary goals.    Admit Weight  250 lb (113.4 kg)    Goal Weight: Short Term  245 lb (111.1 kg)    Goal Weight: Long Term  200 lb (90.7 kg)    Expected Outcomes  Short Term: Continue to assess and modify interventions until short term weight is achieved;Long Term: Adherence to nutrition and physical activity/exercise program aimed toward attainment of established weight goal    Heart Failure  Yes    Intervention  Provide a combined exercise and nutrition program that is supplemented with education, support and counseling about heart failure. Directed toward relieving symptoms such as shortness of breath, decreased exercise tolerance, and extremity edema.    Expected Outcomes  Improve functional capacity of life;Short term: Attendance in program 2-3 days a week with increased exercise capacity. Reported lower sodium intake. Reported increased fruit and vegetable intake. Reports medication compliance.;Short term: Daily weights obtained and reported for increase. Utilizing diuretic protocols set by physician.;Long term: Adoption of self-care skills and reduction of barriers for early signs and symptoms  recognition and intervention leading to self-care maintenance.    Hypertension  Yes    Intervention  Provide education on lifestyle modifcations including regular physical activity/exercise, weight management, moderate sodium restriction and increased consumption of fresh fruit, vegetables, and low fat dairy, alcohol moderation, and smoking cessation.;Monitor prescription use compliance.    Expected Outcomes  Short Term: Continued assessment and intervention until BP is < 140/35m HG in hypertensive participants. < 130/827mHG in hypertensive participants with diabetes, heart failure or chronic kidney disease.;Long Term: Maintenance of blood pressure at goal levels.    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 Teach and support self-help strategies.;Refer participants experiencing significant psychosocial distress to appropriate mental health specialists for further evaluation and treatment. When possible, include family members and significant others in education/counseling sessions.    Expected Outcomes  Long Term: Emotional wellbeing is indicated by absence of clinically significant psychosocial distress or social isolation.;Short Term: Participant demonstrates changes in health-related behavior, relaxation and other stress management skills, ability to obtain effective social support, and compliance with psychotropic medications if prescribed.       Core Components/Risk Factors/Patient Goals Review:  Goals and Risk Factor Review    Row Name 01/31/18 0941 02/26/18 0923           Core Components/Risk Factors/Patient Goals Review   Personal Goals Review  Weight Management/Obesity;Lipids;Hypertension  Weight Management/Obesity;Lipids;Hypertension      Review  DeJackelyn Polings feeling better overall since  beginning exercise.  She says even other people have noticed she seems better.  She started taking Tylenol before exercise to help with back pain.  Her goal is to be able to do water aerobics when she finishes HT.  She is scheduled to meet with RD 4/16  DeJackelyn Polingas been doing well in rehab.  Her weight was doing better, but since her medicaiton  change last week her weight has gone back up and she has edema in her ankles and feet.  She was encouraged to contact her doctor about her swelling.  Her doctor thought she was doing better which was why he pulled some of her meds back.  Also, since her blood pressures have been good, she stopped checking them at home.  We talked about continuing to check her blood pressures and keeping up with them still.  She has not had any lipid work done since December.       Expected Outcomes  Short - Victoria Davila will continue to attend HT and will meet with RD  Long - Victoria Davila will exercise independently  Short: Continue to work on weight and call doctor about swelling.  Long: Continue to monitor risk factors.          Core Components/Risk Factors/Patient Goals at Discharge (Final Review):  Goals and Risk Factor Review - 02/26/18 0923      Core Components/Risk Factors/Patient Goals Review   Personal Goals Review  Weight Management/Obesity;Lipids;Hypertension    Review  Victoria Davila has been doing well in rehab.  Her weight was doing better, but since her medicaiton change last week her weight has gone back up and she has edema in her ankles and feet.  She was encouraged to contact her doctor about her swelling.  Her doctor thought she was doing better which was why he pulled some of her meds back.  Also, since her blood pressures have been good, she stopped checking them at home.  We talked about continuing to check her blood pressures and keeping up with them still.  She has not had any lipid work done since December.     Expected Outcomes  Short: Continue to work on weight and call  doctor about swelling.  Long: Continue to monitor risk factors.        ITP Comments: ITP Comments    Row Name 01/03/18 1347 01/23/18 0631 02/20/18 0547 03/20/18 0610     ITP Comments  Med Review completed. Initial ITP created. Diagnosis can be found in Care Everywhere 11/15/17  30 Day review. Continue with ITP unless directed changes per Medical Director review.   New to program  30 day review. Continue with ITP unless directed changes per Medical Director  30 day review. Continue with ITP unless directed changes per Medical Director       Comments:

## 2018-03-21 ENCOUNTER — Encounter: Payer: BC Managed Care – PPO | Admitting: *Deleted

## 2018-03-21 DIAGNOSIS — Z48812 Encounter for surgical aftercare following surgery on the circulatory system: Secondary | ICD-10-CM | POA: Diagnosis not present

## 2018-03-21 DIAGNOSIS — Z952 Presence of prosthetic heart valve: Secondary | ICD-10-CM

## 2018-03-21 NOTE — Progress Notes (Signed)
Daily Session Note  Patient Details  Name: Victoria Davila MRN: [id number] Date of Birth: [birth year] Referring Provider:     Cardiac Rehab from 01/03/2018 in [facility] Cardiac and Pulmonary Rehab  Referring Provider  Victoria Davila      Encounter Date: 03/21/2018  Check In: Session Check In - 03/21/18 0830      Check-In   Location  [facility]-Cardiac & Pulmonary Rehab    Staff Present  Victoria Burdock, RN, BSN;Victoria Sherryll Burger, RN BSN;Victoria Inclan Luan Pulling, MA, RCEP, CCRP, Exercise Physiologist    Supervising physician immediately available to respond to emergencies  See telemetry face sheet for immediately available ER MD    Medication changes reported      No    Fall or balance concerns reported     No    Warm-up and Cool-down  Performed on first and last piece of equipment    Resistance Training Performed  Yes    VAD Patient?  No      Pain Assessment   Currently in Pain?  No/denies          Social History   Tobacco Use  Smoking Status Never Smoker  Smokeless Tobacco Never Used    Goals Met:  Independence with exercise equipment Exercise tolerated well Personal goals reviewed No report of cardiac concerns or symptoms Strength training completed today  Goals Unmet:  Not Applicable  Comments:  Victoria Davila graduated today from  rehab with 36 sessions completed.  Details of the patient's exercise prescription and what She needs to do in order to continue the prescription and progress were discussed with patient.  Patient was given a copy of prescription and goals.  Patient verbalized understanding.  Victoria Davila plans to continue to exercise by walking at home.    Dr. Emily Davila is Medical Director for [place] [facility].

## 2018-03-21 NOTE — Progress Notes (Signed)
Discharge Progress Report  Patient Details  Name: Victoria Davila MRN: [id number] Date of Birth: 11-Jan-1956 Referring Provider:     Cardiac Rehab from 01/03/2018 in [facility]  Referring Provider  [name]       Number of Visits: 36/36  Reason for Discharge:  Patient reached a stable level of exercise. Patient independent in their exercise. Patient has met program and personal goals.  Smoking History:  Social History   Tobacco Use  Smoking Status Never Smoker  Smokeless Tobacco Never Used    Diagnosis:  S/P mitral valve replacement  ADL [facility]SD:   Initial Exercise Prescription: Initial Exercise Prescription - 01/03/18 1400      Date of Initial Exercise RX and Referring Provider   Date  01/03/18    Referring Provider  [name]      Treadmill   MPH  1    Grade  0    Minutes  15    METs  1.5      NuStep   Level  1    SPM  80    Minutes  15    METs  1.5      Biostep-RELP   Level  1    SPM  50    Minutes  15    METs  1.5      Track   Laps  11    Minutes  15    METs  1.5      Prescription Details   Frequency (times per week)  3    Duration  Progress to 30 minutes of continuous aerobic without signs/symptoms of physical distress      Intensity   THRR 40-80% of Max Heartrate  116-144    Ratings of Perceived Exertion  11-15    Perceived Dyspnea  0-4      Resistance Training   Training Prescription  Yes    Weight  2 lb    Reps  10-15       Discharge Exercise Prescription (Final Exercise Prescription Changes): Exercise Prescription Changes - 03/12/18 1400      Response to Exercise   Blood Pressure (Admit)  132/74    Blood Pressure (Exercise)  142/68    Blood Pressure (Exit)  112/70    Heart Rate (Admit)  72 bpm    Heart Rate (Exercise)  103 bpm    Heart Rate (Exit)  85 bpm    Rating of Perceived Exertion (Exercise)  15    Symptoms  fatigue    Duration  Continue with 30 min of aerobic exercise without signs/symptoms of  physical distress.    Intensity  THRR unchanged      Progression   Progression  Continue to progress workloads to maintain intensity without signs/symptoms of physical distress.    Average METs  2.06      Resistance Training   Training Prescription  Yes    Weight  2 lbs    Reps  10-15      Interval Training   Interval Training  No      Treadmill   MPH  1    Grade  0    Minutes  15    METs  1.77      NuStep   Level  4    Minutes  15    METs  2.4      Biostep-RELP   Level  1    Minutes  15    METs  2  Home Exercise Plan   Plans to continue exercise at  [facility] (comment) water exercise     Frequency  Add 1 additional day to program exercise sessions.    Initial Home Exercises Provided  01/29/18       Functional Capacity: 6 Minute Walk    Row Name 01/03/18 1420 03/07/18 0950       6 Minute Walk   Phase  -  Discharge    Distance  600 feet  900 feet    Distance % Change  -  50 %    Distance Feet Change  -  300 ft    Walk Time  4.5 minutes  6 minutes    # of Rest Breaks  2  0    MPH  1.5  1.7    METS  1.5  2.08    RPE  15  15    Perceived Dyspnea   2  -    VO2 Peak  5.12  7.3    Symptoms  Yes (comment)  No    Comments  leg tightness (quads) 5/10 - afraid of falling   back pain, leg pain 5/10    Resting HR  88 bpm  69 bpm    Resting BP  118/84  124/80    Resting Oxygen Saturation   99 %  -    Exercise Oxygen Saturation  during 6 min walk  95 %  -    Max Ex. HR  123 bpm  101 bpm    Max Ex. BP  138/88  180/88    2 Minute Post BP  136/84  -       Psychological, QOL, Others - Outcomes: PHQ 2/9: Depression screen [facility] 2/9 03/14/2018 01/03/2018 04/25/2017 02/29/2016  Decreased Interest 0 0 0 0  Down, Depressed, Hopeless 0 0 0 0  PHQ - 2 Score 0 0 0 0  Altered sleeping 0 0 0 -  Tired, decreased energy 1 3 0 -  Change in appetite 1 0 0 -  Feeling bad or failure about yourself  0 0 0 -  Trouble concentrating 0 0 0 -  Moving slowly or  fidgety/restless 0 0 0 -  Suicidal thoughts 0 0 0 -  PHQ-9 Score 2 3 0 -  Difficult doing work/chores - Not difficult at all - -    Quality of Life: Quality of Life - 03/14/18 1201      Quality of Life Scores   Health/Function Pre  20.03 %    Health/Function Post  15.07 %    Health/Function % Change  -24.76 %    Socioeconomic Pre  26.5 %    Socioeconomic Post  25.57 %    Socioeconomic % Change   -3.51 %    Psych/Spiritual Pre  26.79 %    Psych/Spiritual Post  22.14 %    Psych/Spiritual % Change  -17.36 %    Family Pre  28.8 %    Family Post  25.2 %    Family % Change  -12.5 %    GLOBAL Pre  24.04 %    GLOBAL Post  20.33 %    GLOBAL % Change  -15.43 %       Personal Goals: Goals established at orientation with interventions provided to work toward goal. Personal Goals and Risk Factors at Admission - 01/03/18 1352      Core Components/Risk Factors/Patient Goals on Admission    Weight Management  Yes;Obesity;Weight Loss  Intervention  Weight Management: Develop a combined nutrition and exercise program designed to reach desired caloric intake, while maintaining appropriate intake of nutrient and fiber, sodium and fats, and appropriate energy expenditure required for the weight goal.;Weight Management: Provide education and appropriate resources to help participant work on and attain dietary goals.;Weight Management/Obesity: Establish reasonable short term and long term weight goals.;Obesity: Provide education and appropriate resources to help participant work on and attain dietary goals.    Admit Weight  250 lb (113.4 kg)    Goal Weight: Short Term  245 lb (111.1 kg)    Goal Weight: Long Term  200 lb (90.7 kg)    Expected Outcomes  Short Term: Continue to assess and modify interventions until short term weight is achieved;Long Term: Adherence to nutrition and physical activity/exercise program aimed toward attainment of established weight goal    Heart Failure  Yes     Intervention  Provide a combined exercise and nutrition program that is supplemented with education, support and counseling about heart failure. Directed toward relieving symptoms such as shortness of breath, decreased exercise tolerance, and extremity edema.    Expected Outcomes  Improve functional capacity of life;Short term: Attendance in program 2-3 days a week with increased exercise capacity. Reported lower sodium intake. Reported increased fruit and vegetable intake. Reports medication compliance.;Short term: Daily weights obtained and reported for increase. Utilizing diuretic protocols set by physician.;Long term: Adoption of self-care skills and reduction of barriers for early signs and symptoms recognition and intervention leading to self-care maintenance.    Hypertension  Yes    Intervention  Provide education on lifestyle modifcations including regular physical activity/exercise, weight management, moderate sodium restriction and increased consumption of fresh fruit, vegetables, and low fat dairy, alcohol moderation, and smoking cessation.;Monitor prescription use compliance.    Expected Outcomes  Short Term: Continued assessment and intervention until BP is < 140/95m HG in hypertensive participants. < 130/851mHG in hypertensive participants with diabetes, heart failure or chronic kidney disease.;Long Term: Maintenance of blood pressure at goal levels.    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 Teach and support self-help strategies.;Refer participants experiencing significant psychosocial distress to appropriate mental health specialists for further evaluation and treatment. When possible, include family members and significant others in education/counseling sessions.    Expected Outcomes  Long  Term: Emotional wellbeing is indicated by absence of clinically significant psychosocial distress or social isolation.;Short Term: Participant demonstrates changes in health-related behavior, relaxation and other stress management skills, ability to obtain effective social support, and compliance with psychotropic medications if prescribed.        Personal Goals Discharge: Goals and Risk Factor Review    Row Name 01/31/18 0941 02/26/18 0923           Core Components/Risk Factors/Patient Goals Review   Personal Goals Review  Weight Management/Obesity;Lipids;Hypertension  Weight Management/Obesity;Lipids;Hypertension      Review  DeJackelyn Polings feeling better overall since beginning exercise.  She says even other people have noticed she seems better.  She started taking Tylenol before exercise to help with back pain.  Her goal is to be able to do water aerobics when she finishes HT.  She is scheduled to meet with RD 4/16  DeJackelyn Polingas been doing well in rehab.  Her weight was doing better, but  since her medicaiton change last week her weight has gone back up and she has edema in her ankles and feet.  She was encouraged to contact her doctor about her swelling.  Her doctor thought she was doing better which was why he pulled some of her meds back.  Also, since her blood pressures have been good, she stopped checking them at home.  We talked about continuing to check her blood pressures and keeping up with them still.  She has not had any lipid work done since December.       Expected Outcomes  Short - Jackelyn Poling will continue to attend HT and will meet with RD  Long - Debbie will exercise independently  Short: Continue to work on weight and call doctor about swelling.  Long: Continue to monitor risk factors.          Exercise Goals and Review: Exercise Goals    Row Name 01/03/18 1419             Exercise Goals   Increase Physical Activity  Yes       Intervention  Provide advice, education, support and  counseling about physical activity/exercise needs.;Develop an individualized exercise prescription for aerobic and resistive training based on initial evaluation findings, risk stratification, comorbidities and participant's personal goals.       Expected Outcomes  Short Term: Attend rehab on a regular basis to increase amount of physical activity.;Long Term: Add in home exercise to make exercise part of routine and to increase amount of physical activity.;Long Term: Exercising regularly at least 3-5 days a week.       Increase Strength and Stamina  Yes       Intervention  Provide advice, education, support and counseling about physical activity/exercise needs.;Develop an individualized exercise prescription for aerobic and resistive training based on initial evaluation findings, risk stratification, comorbidities and participant's personal goals.       Expected Outcomes  Short Term: Increase workloads from initial exercise prescription for resistance, speed, and METs.;Short Term: Perform resistance training exercises routinely during rehab and add in resistance training at home;Long Term: Improve cardiorespiratory fitness, muscular endurance and strength as measured by increased METs and functional capacity (6MWT)       Able to understand and use rate of perceived exertion (RPE) scale  Yes       Intervention  Provide education and explanation on how to use RPE scale       Expected Outcomes  Short Term: Able to use RPE daily in rehab to express subjective intensity level;Long Term:  Able to use RPE to guide intensity level when exercising independently       Able to understand and use Dyspnea scale  Yes       Intervention  Provide education and explanation on how to use Dyspnea scale       Expected Outcomes  Short Term: Able to use Dyspnea scale daily in rehab to express subjective sense of shortness of breath during exertion;Long Term: Able to use Dyspnea scale to guide intensity level when exercising  independently       Knowledge and understanding of Target Heart Rate Range (THRR)  Yes       Intervention  Provide education and explanation of THRR including how the numbers were predicted and where they are located for reference       Expected Outcomes  Short Term: Able to state/look up THRR;Long Term: Able to use THRR to govern intensity when exercising independently;Short Term: Able  to use daily as guideline for intensity in rehab       Able to check pulse independently  Yes       Intervention  Provide education and demonstration on how to check pulse in carotid and radial arteries.;Review the importance of being able to check your own pulse for safety during independent exercise       Expected Outcomes  Short Term: Able to explain why pulse checking is important during independent exercise;Long Term: Able to check pulse independently and accurately       Understanding of Exercise Prescription  Yes       Intervention  Provide education, explanation, and written materials on patient's individual exercise prescription       Expected Outcomes  Short Term: Able to explain program exercise prescription;Long Term: Able to explain home exercise prescription to exercise independently          Nutrition & Weight - Outcomes: Pre Biometrics - 01/03/18 1419      Pre Biometrics   Height  5' 3" (1.6 m)    Weight  249 lb 14.4 oz (113.4 kg)    Waist Circumference  43 inches    Hip Circumference  57 inches    Waist to Hip Ratio  0.75 %    BMI (Calculated)  44.28      Post Biometrics - 03/07/18 0951       Post  Biometrics   Height  5' 3" (1.6 m)    Weight  249 lb 3.2 oz (113 kg)    Waist Circumference  43 inches    Hip Circumference  57 inches    Waist to Hip Ratio  0.75 %    BMI (Calculated)  44.15       Nutrition: Nutrition Therapy & Goals - 03/07/18 1027      Nutrition Therapy   Diet  DASH    Drug/Food Interactions  Coumadin/Vit K handout on coumadin and diet guidelines provided     Protein (specify units)  7-8oz    Fiber  25 grams    Whole Grain Foods  3 servings    Saturated Fats  12 max. grams    Fruits and Vegetables  5 servings/day 8 ideal. eats fruits but not a large variety of vegetables    Sodium  2000 grams monitors sodium d/t HTN and chronic LE edema      Personal Nutrition Goals   Nutrition Goal  While on coumadin, monitor your intake of foods high in Vitamin K. Try to stick around the portion sizes of 1/2 cup cooked or 1 cup raw daily, and consume around the same amounts each day    Personal Goal #2  Work to reduce the frequency of eating meals out. Use the strategies discussed to order meals lower in calories and sodium, such as looking up nutrition information ahead of time or ordering sauces on the side    Personal Goal #3  Increase the variety of vegetables you consume; focus on more non-starch vegetables. Look to include sources of vegetables daily    Comments  She and her husband are learning to read nutrition facts labels to identify low/high sodium foods. They are also trying to be more consious of thier food choices and to reduce total sodium intake      Intervention Plan   Intervention  Prescribe, educate and counsel regarding individualized specific dietary modifications aiming towards targeted core components such as weight, hypertension, lipid management, diabetes, heart failure and other comorbidities.;Nutrition  handout(s) given to patient. Following a low sodium diet, coumadin info sheet, salt-free seasoning guide    Expected Outcomes  Short Term Goal: Understand basic principles of dietary content, such as calories, fat, sodium, cholesterol and nutrients.;Short Term Goal: A plan has been developed with personal nutrition goals set during dietitian appointment.;Long Term Goal: Adherence to prescribed nutrition plan.       Nutrition Discharge: Nutrition Assessments - 03/14/18 1203      MEDFICTS Scores   Pre Score  15    Post Score  19    Score  Difference  4       Education Questionnaire Score: Knowledge Questionnaire Score - 03/14/18 1201      Knowledge Questionnaire Score   Pre Score  26/28    Post Score  27/28       Goals reviewed with patient; copy given to patient.

## 2018-03-21 NOTE — Progress Notes (Signed)
Cardiac Individual Treatment Plan  Patient Details  Name: Victoria Davila MRN: [id number] Date of Birth: [birth year] Referring Provider:     Cardiac Rehab from 01/03/2018 in [facility] Cardiac and Pulmonary Rehab  Referring Provider  [name]      Initial Encounter Date:    Cardiac Rehab from 01/03/2018 in [facility] Cardiac and Pulmonary Rehab  Date  01/03/18  Referring Provider  [name]      Visit Diagnosis: S/P mitral valve replacement  Patient's Home Medications on Admission:  Current Outpatient Medications:  .  acetaminophen (TYLENOL) 500 MG tablet, Take 1,000 mg by mouth every 6 (six) hours as needed for mild pain or moderate pain., Disp: , Rfl:  .  aspirin 81 MG chewable tablet, Chew 81 mg by mouth daily., Disp: , Rfl:  .  DILANTIN 100 MG ER capsule, Take 4 capsules (400 mg total) by mouth daily., Disp: 360 capsule, Rfl: 3 .  folic acid (FOLVITE) 1 MG tablet, Take 1 mg by mouth daily., Disp: , Rfl:  .  gabapentin (NEURONTIN) 100 MG capsule, Take 100 mg by mouth 3 (three) times daily., Disp: , Rfl:  .  metoprolol succinate (TOPROL-XL) 25 MG 24 hr tablet, Take 25 mg by mouth daily., Disp: , Rfl:  .  Multiple Vitamin (MULTIVITAMIN WITH MINERALS) TABS tablet, Take 1 tablet by mouth at bedtime., Disp: , Rfl:  .  Omega-3 Fatty Acids (FISH OIL) 1200 MG CAPS, Take 1 capsule by mouth 2 (two) times daily. , Disp: , Rfl:  .  potassium chloride (K-DUR) 10 MEQ tablet, Take 3 tablets (30 mEq total) by mouth 2 (two) times daily., Disp: 180 tablet, Rfl: 1 .  psyllium (REGULOID) 0.52 G capsule, Take 0.52 g by mouth 2 (two) times daily., Disp: , Rfl:  .  torsemide (DEMADEX) 20 MG tablet, Take 2 tablets (40 mg total) by mouth 2 (two) times daily., Disp: 60 tablet, Rfl: 4 .  valACYclovir (VALTREX) 1000 MG tablet, Take 1 tablet (1,000 mg total) by mouth 2 (two) times daily. For one day., Disp: 60 tablet, Rfl: 5 .  warfarin (COUMADIN) 3 MG tablet, Take 2 tablets (6 mg total) by mouth daily., Disp: 60 tablet,  Rfl: 5 .  warfarin (COUMADIN) 4 MG tablet, TAKE 1 TABLET EVERY DAY WITH A 3MG TABLET, Disp: 30 tablet, Rfl: 5 .  warfarin (COUMADIN) 5 MG tablet, Take 1 tablet (5 mg total) by mouth daily., Disp: 30 tablet, Rfl: 3 .  warfarin (COUMADIN) 6 MG tablet, Take 1 tablet (6 mg total) by mouth daily., Disp: 30 tablet, Rfl: 12  Past Medical History: Past Medical History:  Diagnosis Date  . Dysrhythmia    A FIB / FOLLOWED BY DR [place]  . Edema of both legs    CHRONIC  . Hyperlipidemia   . Hypertension   . Mitral valve disorder    "deformed"  . Neuromuscular disorder ([place])   . Rheumatoid arthritis ([place])    RECENT DX - HAS NOT YET SEEN RHEUMATOLOGIST  . Seizures ([place])    LAST SEIZURE JAN 1995  . Shortness of breath dyspnea    WITH EXERTION    Tobacco Use: Social History   Tobacco Use  Smoking Status Never Smoker  Smokeless Tobacco Never Used    Labs: Recent Review Flowsheet Data    Labs for ITP Cardiac and Pulmonary Rehab Latest Ref Rng & Units 09/28/2014 03/01/2016 05/25/2017 10/05/2017   Cholestrol <200 mg/dL - 287(H) 266(H) 259(H)   LDLCALC mg/dL (calc) -  183(H) 154(H) 161(H)   HDL >50 mg/dL - 68 80 68   Trlycerides <150 mg/dL - 178(H) 161(H) 155(H)   Hemoglobin A1c 4.8 - 5.6 % 5.7 5.6 - -       Exercise Target Goals:    Exercise Program Goal: Individual exercise prescription set using results from initial 6 min walk test and THRR while considering  patient's activity barriers and safety.   Exercise Prescription Goal: Initial exercise prescription builds to 30-45 minutes a day of aerobic activity, 2-3 days per week.  Home exercise guidelines will be given to patient during program as part of exercise prescription that the participant will acknowledge.  Activity Barriers & Risk Stratification: Activity Barriers & Cardiac Risk Stratification - 01/03/18 1508      Activity Barriers & Cardiac Risk Stratification   Activity Barriers  Arthritis;Back Problems;Joint  Problems;Left Knee Replacement;Incisional Pain;Shortness of Breath    Cardiac Risk Stratification  Moderate       6 Minute Walk: 6 Minute Walk    Row Name 01/03/18 1420 03/07/18 0950       6 Minute Walk   Phase  -  Discharge    Distance  600 feet  900 feet    Distance % Change  -  50 %    Distance Feet Change  -  300 ft    Walk Time  4.5 minutes  6 minutes    # of Rest Breaks  2  0    MPH  1.5  1.7    METS  1.5  2.08    RPE  15  15    Perceived Dyspnea   2  -    VO2 Peak  5.12  7.3    Symptoms  Yes (comment)  No    Comments  leg tightness (quads) 5/10 - afraid of falling   back pain, leg pain 5/10    Resting HR  88 bpm  69 bpm    Resting BP  118/84  124/80    Resting Oxygen Saturation   99 %  -    Exercise Oxygen Saturation  during 6 min walk  95 %  -    Max Ex. HR  123 bpm  101 bpm    Max Ex. BP  138/88  180/88    2 Minute Post BP  136/84  -       Oxygen Initial Assessment:   Oxygen Re-Evaluation:   Oxygen Discharge (Final Oxygen Re-Evaluation):   Initial Exercise Prescription: Initial Exercise Prescription - 01/03/18 1400      Date of Initial Exercise RX and Referring Provider   Date  01/03/18    Referring Provider  [name]      Treadmill   MPH  1    Grade  0    Minutes  15    METs  1.5      NuStep   Level  1    SPM  80    Minutes  15    METs  1.5      Biostep-RELP   Level  1    SPM  50    Minutes  15    METs  1.5      Track   Laps  11    Minutes  15    METs  1.5      Prescription Details   Frequency (times per week)  3    Duration  Progress to 30 minutes of continuous aerobic without signs/symptoms of  physical distress      Intensity   THRR 40-80% of Max Heartrate  116-144    Ratings of Perceived Exertion  11-15    Perceived Dyspnea  0-4      Resistance Training   Training Prescription  Yes    Weight  2 lb    Reps  10-15       Perform Capillary Blood Glucose checks as needed.  Exercise Prescription Changes: Exercise  Prescription Changes    Row Name 01/03/18 1400 01/15/18 1500 01/29/18 1000 01/30/18 1400 02/12/18 1600     Response to Exercise   Blood Pressure (Admit)  118/84  122/70  -  122/64  126/62   Blood Pressure (Exercise)  138/88  132/74  -  142/68  136/60   Blood Pressure (Exit)  136/84  114/60  -  134/80  116/70   Heart Rate (Admit)  97 bpm  77 bpm  -  89 bpm  59 bpm   Heart Rate (Exercise)  121 bpm  103 bpm  -  113 bpm  99 bpm   Heart Rate (Exit)  107 bpm  86 bpm  -  115 bpm  76 bpm   Oxygen Saturation (Admit)  99 %  -  -  -  -   Oxygen Saturation (Exit)  95 %  -  -  -  -   Rating of Perceived Exertion (Exercise)  15  15  -  12  13   Symptoms  -  fatigue  -  fatigue  fatigue   Comments  -  first full day of exercise  -  -  -   Duration  -  Progress to 30 minutes of  aerobic without signs/symptoms of physical distress  -  Continue with 30 min of aerobic exercise without signs/symptoms of physical distress.  Continue with 30 min of aerobic exercise without signs/symptoms of physical distress.   Intensity  -  THRR unchanged  -  THRR unchanged  THRR unchanged     Progression   Progression  -  Continue to progress workloads to maintain intensity without signs/symptoms of physical distress.  -  Continue to progress workloads to maintain intensity without signs/symptoms of physical distress.  Continue to progress workloads to maintain intensity without signs/symptoms of physical distress.   Average METs  -  1.79  -  1.85  1.92     Resistance Training   Training Prescription  -  Yes  -  Yes  Yes   Weight  -  2 lb  -  2 lbs  2 lbs   Reps  -  10-15  -  10-15  10-15     Interval Training   Interval Training  -  No  -  No  No     Treadmill   MPH  -  1  -  1  1   Grade  -  0  -  0  0   Minutes  -  1  -  15  15   METs  -  1.77  -  1.77  1.77     NuStep   Level  -  1  -  1  4   Minutes  -  15  -  15  15   METs  -  1.8  -  1.8  2     Biostep-RELP   Level  -  -  -  1  1  Minutes  -  -  -  15   15   METs  -  -  -  2  2     Home Exercise Plan   Plans to continue exercise at  -  -  [employer] (comment) water exercise   [employer] (comment) water exercise   [employer] (comment) water exercise    Frequency  -  -  Add 1 additional day to program exercise sessions.  Add 1 additional day to program exercise sessions.  Add 1 additional day to program exercise sessions.   Initial Home Exercises Provided  -  -  01/29/18  01/29/18  01/29/18   Row Name 02/26/18 1500 03/12/18 1400           Response to Exercise   Blood Pressure (Admit)  126/60  132/74      Blood Pressure (Exercise)  132/74  142/68      Blood Pressure (Exit)  126/58  112/70      Heart Rate (Admit)  86 bpm  72 bpm      Heart Rate (Exercise)  89 bpm  103 bpm      Heart Rate (Exit)  79 bpm  85 bpm      Rating of Perceived Exertion (Exercise)  13  15      Symptoms  fatigue  fatigue      Duration  Continue with 30 min of aerobic exercise without signs/symptoms of physical distress.  Continue with 30 min of aerobic exercise without signs/symptoms of physical distress.      Intensity  THRR unchanged  THRR unchanged        Progression   Progression  Continue to progress workloads to maintain intensity without signs/symptoms of physical distress.  Continue to progress workloads to maintain intensity without signs/symptoms of physical distress.      Average METs  2.4  2.06        Resistance Training   Training Prescription  Yes  Yes      Weight  2 lbs  2 lbs      Reps  10-15  10-15        Interval Training   Interval Training  No  No        Treadmill   MPH  - has not been doing treadmill since hurting her back  1      Grade  -  0      Minutes  -  15      METs  -  1.77        NuStep   Level  4  4      Minutes  15  15      METs  2.6  2.4        Biostep-RELP   Level  1  1      Minutes  15  15      METs  2  2        Home Exercise Plan   Plans to continue exercise at  [employer]  (comment) water exercise   [employer] (comment) water exercise       Frequency  Add 1 additional day to program exercise sessions.  Add 1 additional day to program exercise sessions.      Initial Home Exercises Provided  01/29/18  01/29/18         Exercise Comments: Exercise Comments    Row Name 01/15/18 [phone] 01/29/18 1036 03/12/18 0914 03/21/18 0830  Exercise Comments   First full day of exercise!  Patient was oriented to gym and equipment including functions, settings, policies, and procedures.  Patient's individual exercise prescription and treatment plan were reviewed.  All starting workloads were established based on the results of the 6 minute walk test done at initial orientation visit.  The plan for exercise progression was also introduced and progression will be customized based on patient's performance and goals.  Reviewed home exercise with pt today.  Pt plans to do water exercise for exercise.  Reviewed THR, pulse, RPE, sign and symptoms, NTG use, and when to call 911 or MD.  Also discussed weather considerations and indoor options.  Pt voiced understanding.  Debbie asked about having roof of mouth and side of neck pain for 2 days after her walk test and once after TM.  After discussing with staff, symptoms were most likely muscle soreness.   Staff explained the difference in angina pain and DOMS.  Debbie was instructued to call her Dr if s/s persist.   Carmeline graduated today from  rehab with 36 sessions completed.  Details of the patient's exercise prescription and what She needs to do in order to continue the prescription and progress were discussed with patient.  Patient was given a copy of prescription and goals.  Patient verbalized understanding.  Nolyn plans to continue to exercise by walking at home.       Exercise Goals and Review: Exercise Goals    Row Name 01/03/18 1419             Exercise Goals   Increase Physical Activity  Yes       Intervention  Provide  advice, education, support and counseling about physical activity/exercise needs.;Develop an individualized exercise prescription for aerobic and resistive training based on initial evaluation findings, risk stratification, comorbidities and participant's personal goals.       Expected Outcomes  Short Term: Attend rehab on a regular basis to increase amount of physical activity.;Long Term: Add in home exercise to make exercise part of routine and to increase amount of physical activity.;Long Term: Exercising regularly at least 3-5 days a week.       Increase Strength and Stamina  Yes       Intervention  Provide advice, education, support and counseling about physical activity/exercise needs.;Develop an individualized exercise prescription for aerobic and resistive training based on initial evaluation findings, risk stratification, comorbidities and participant's personal goals.       Expected Outcomes  Short Term: Increase workloads from initial exercise prescription for resistance, speed, and METs.;Short Term: Perform resistance training exercises routinely during rehab and add in resistance training at home;Long Term: Improve cardiorespiratory fitness, muscular endurance and strength as measured by increased METs and functional capacity (6MWT)       Able to understand and use rate of perceived exertion (RPE) scale  Yes       Intervention  Provide education and explanation on how to use RPE scale       Expected Outcomes  Short Term: Able to use RPE daily in rehab to express subjective intensity level;Long Term:  Able to use RPE to guide intensity level when exercising independently       Able to understand and use Dyspnea scale  Yes       Intervention  Provide education and explanation on how to use Dyspnea scale       Expected Outcomes  Short Term: Able to use Dyspnea scale daily in rehab to express  subjective sense of shortness of breath during exertion;Long Term: Able to use Dyspnea scale to guide  intensity level when exercising independently       Knowledge and understanding of Target Heart Rate Range (THRR)  Yes       Intervention  Provide education and explanation of THRR including how the numbers were predicted and where they are located for reference       Expected Outcomes  Short Term: Able to state/look up THRR;Long Term: Able to use THRR to govern intensity when exercising independently;Short Term: Able to use daily as guideline for intensity in rehab       Able to check pulse independently  Yes       Intervention  Provide education and demonstration on how to check pulse in carotid and radial arteries.;Review the importance of being able to check your own pulse for safety during independent exercise       Expected Outcomes  Short Term: Able to explain why pulse checking is important during independent exercise;Long Term: Able to check pulse independently and accurately       Understanding of Exercise Prescription  Yes       Intervention  Provide education, explanation, and written materials on patient's individual exercise prescription       Expected Outcomes  Short Term: Able to explain program exercise prescription;Long Term: Able to explain home exercise prescription to exercise independently          Exercise Goals Re-Evaluation : Exercise Goals Re-Evaluation    Row Name 01/15/18 0908 01/29/18 1036 02/12/18 1558 02/26/18 0920 03/12/18 1405     Exercise Goal Re-Evaluation   Exercise Goals Review  Understanding of Exercise Prescription;Knowledge and understanding of Target Heart Rate Range (THRR);Able to understand and use rate of perceived exertion (RPE) scale  Increase Physical Activity;Increase Strength and Stamina;Able to understand and use rate of perceived exertion (RPE) scale;Knowledge and understanding of Target Heart Rate Range (THRR);Able to check pulse independently;Understanding of Exercise Prescription  Increase Physical Activity;Understanding of Exercise  Prescription;Increase Strength and Stamina  Increase Physical Activity;Understanding of Exercise Prescription;Increase Strength and Stamina  Increase Physical Activity;Understanding of Exercise Prescription;Increase Strength and Stamina   Comments  Reviewed RPE scale, THR and program prescription with pt today.  Pt voiced understanding and was given a copy of goals to take home.   Hilda Blades wants to try water exercise and her Dr recommended it as well. We discussed different pool options and safety.  Jackelyn Poling has been doing well in rehab.  She is now up to level 4 on the NuStep.  We will increase her workload on the BioStep at next visit.  We will also encourage her to increase her speed on the treadmill.  We will continue to monitor her progression.   Jackelyn Poling continues to do well in rehab.  She has been doing her exercise, but just not enough and she would like to do more.  She has been doing some walking and uses weight and bands at home.  Her whole routine takes about 20 minutes.  We talked about adding in more time at home.  She needs to aim for 55mn of walking alone in additon to bands and weights.  She was agreeable to trying to increase her weights.  She had also expressed interest in water aerobics but has not joined the pool yet.  She said she was [place] so [place] could walk into pool.  We talked about [facility] having stairs into  pool, she was willing to look into this until center reopens.   Jackelyn Poling will be graduating soon!!  She improved her post 6MWT by 376f!!!  She is planning to continue to exercise by walking at home.  We have continued to encourage her to seek out exercise in a pool if possible.  We will continue to monitor her progression.   Expected Outcomes  Short: Use RPE daily to regulate intensity.  Long: Follow program prescription in THR.  Short - DJackelyn Polingwill add 1 day outside program sessions  Long - DJackelyn Polingwill maintain exercise on her own  Short: Increase BioStep and  speed on treadmill.  Long: Add in one extra day at home consistently.   Short: Increase exercise time to 337m and look into [facility] pool.  Long: Add in more exercise at home.   Short: Try to increase treadmill and graduate!  Long: Continue to exercise independently.       Discharge Exercise Prescription (Final Exercise Prescription Changes): Exercise Prescription Changes - 03/12/18 1400      Response to Exercise   Blood Pressure (Admit)  132/74    Blood Pressure (Exercise)  142/68    Blood Pressure (Exit)  112/70    Heart Rate (Admit)  72 bpm    Heart Rate (Exercise)  103 bpm    Heart Rate (Exit)  85 bpm    Rating of Perceived Exertion (Exercise)  15    Symptoms  fatigue    Duration  Continue with 30 min of aerobic exercise without signs/symptoms of physical distress.    Intensity  THRR unchanged      Progression   Progression  Continue to progress workloads to maintain intensity without signs/symptoms of physical distress.    Average METs  2.06      Resistance Training   Training Prescription  Yes    Weight  2 lbs    Reps  10-15      Interval Training   Interval Training  No      Treadmill   MPH  1    Grade  0    Minutes  15    METs  1.77      NuStep   Level  4    Minutes  15    METs  2.4      Biostep-RELP   Level  1    Minutes  15    METs  2      Home Exercise Plan   [facility]     Frequency  Add 1 additional day to program exercise sessions.    Initial Home Exercises Provided  01/29/18       Nutrition:  Target Goals: Understanding of nutrition guidelines, daily intake of sodium <150065mcholesterol <200m51malories 30% from fat and 7% or less from saturated fats, daily to have 5 or more servings of fruits and vegetables.  Biometrics: Pre Biometrics - 01/03/18 1419      Pre Biometrics   Height  _0  (1.6 m)    Weight  249 lb 14.4 oz (113.4 kg)    Waist Circumference  43 inches    Hip  Circumference  57 inches    Waist to Hip Ratio  0.75 %    BMI (Calculated)  44.28      Post Biometrics - 03/07/18 0951       Post  Biometrics   Height  _1  (1.6 m)  Weight  249 lb 3.2 oz (113 kg)    Waist Circumference  43 inches    Hip Circumference  57 inches    Waist to Hip Ratio  0.75 %    BMI (Calculated)  44.15       Nutrition Therapy Plan and Nutrition Goals: Nutrition Therapy & Goals - 03/07/18 1027      Nutrition Therapy   Diet  DASH    Drug/Food Interactions  Coumadin/Vit K handout on coumadin and diet guidelines provided    Protein (specify units)  7-8oz    Fiber  25 grams    Whole Grain Foods  3 servings    Saturated Fats  12 max. grams    Fruits and Vegetables  5 servings/day 8 ideal. eats fruits but not a large variety of vegetables    Sodium  2000 grams monitors sodium d/t HTN and chronic LE edema      Personal Nutrition Goals   Nutrition Goal  While on coumadin, monitor your intake of foods high in Vitamin K. Try to stick around the portion sizes of 1/2 cup cooked or 1 cup raw daily, and consume around the same amounts each day    Personal Goal #2  Work to reduce the frequency of eating meals out. Use the strategies discussed to order meals lower in calories and sodium, such as looking up nutrition information ahead of time or ordering sauces on the side    Personal Goal #3  Increase the variety of vegetables you consume; focus on more non-starch vegetables. Look to include sources of vegetables daily    Comments  She and her husband are learning to read nutrition facts labels to identify low/high sodium foods. They are also trying to be more consious of thier food choices and to reduce total sodium intake      Intervention Plan   Intervention  Prescribe, educate and counsel regarding individualized specific dietary modifications aiming towards targeted core components such as weight, hypertension, lipid management, diabetes, heart failure and other  comorbidities.;Nutrition handout(s) given to patient. Following a low sodium diet, coumadin info sheet, salt-free seasoning guide    Expected Outcomes  Short Term Goal: Understand basic principles of dietary content, such as calories, fat, sodium, cholesterol and nutrients.;Short Term Goal: A plan has been developed with personal nutrition goals set during dietitian appointment.;Long Term Goal: Adherence to prescribed nutrition plan.       Nutrition Assessments: Nutrition Assessments - 03/14/18 1203      MEDFICTS Scores   Pre Score  15    Post Score  19    Score Difference  4       Nutrition Goals Re-Evaluation: Nutrition Goals Re-Evaluation    Row Name 02/05/18 1058 02/26/18 0927 03/07/18 1033         Goals   Nutrition Goal  Meet with dietcian  Meet with dietcian  While on coumadin, monitor your intake of foods high in Vitamin K. Try to stick around the portion sizes of 1/2 cup cooked or 1 cup raw daily, and consume around the same amounts each day     Comment  Has appointment scheduled for next Thursday afternoons  Debbie missed her appointment with dietician.  She has been rescheduled for 5/9 right after class so that she can give her full attention.  She still wants help with what to work on in her diet.   She would like to include salads in her diet to help her choose lower calorie meals,  but has been avoiding all food with vitamin k     Expected Outcome  Short: Meet  with dietician  Long: Continue to follow recommendations.   Short: Meet  with dietician  Long: Continue to follow recommendations.   She will consume foods high in vitamin k in the amounts listed (1/2 cup - 1 cup) and try to consume around the same amounts of these foods daily       Personal Goal #2 Re-Evaluation   Personal Goal #2  -  -  Work to reduce the frequency of eating meals out. Use strategies discussed to order meals lower in calories and sodium, such as looking up nutrition information ahead of time or  ordering sauces on the side       Personal Goal #3 Re-Evaluation   Personal Goal #3  -  -  Increase the variety of vegetables you consume; focus on non-starchy vegetables. Look to include sources of vegetables daily        Nutrition Goals Discharge (Final Nutrition Goals Re-Evaluation): Nutrition Goals Re-Evaluation - 03/07/18 1033      Goals   Nutrition Goal  While on coumadin, monitor your intake of foods high in Vitamin K. Try to stick around the portion sizes of 1/2 cup cooked or 1 cup raw daily, and consume around the same amounts each day    Comment  She would like to include salads in her diet to help her choose lower calorie meals, but has been avoiding all food with vitamin k    Expected Outcome  She will consume foods high in vitamin k in the amounts listed (1/2 cup - 1 cup) and try to consume around the same amounts of these foods daily      Personal Goal #2 Re-Evaluation   Personal Goal #2  Work to reduce the frequency of eating meals out. Use strategies discussed to order meals lower in calories and sodium, such as looking up nutrition information ahead of time or ordering sauces on the side      Personal Goal #3 Re-Evaluation   Personal Goal #3  Increase the variety of vegetables you consume; focus on non-starchy vegetables. Look to include sources of vegetables daily       Psychosocial: Target Goals: Acknowledge presence or absence of significant depression and/or stress, maximize coping skills, provide positive support system. Participant is able to verbalize types and ability to use techniques and skills needed for reducing stress and depression.   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  Yes husband      Screening Interventions   Interventions  Encouraged to exercise;Program counselor consult;To provide support and resources with identified psychosocial needs;Provide feedback about the scores to participant    Expected Outcomes  Short Term goal: Utilizing psychosocial counselor, staff and physician to assist with identification of specific Stressors or current issues interfering with healing process. Setting desired goal for each stressor or current issue identified.;Long Term Goal: Stressors or current issues are controlled or eliminated.;Long Term goal: The participant improves quality of Life and PHQ9 Scores as seen by post scores and/or  verbalization of changes;Short Term goal: Identification and review with participant of any Quality of Life or Depression concerns found by scoring the questionnaire.       Quality of Life Scores:  Quality of Life - 03/14/18 1201      Quality of Life Scores   Health/Function Pre  20.03 %    Health/Function Post  15.07 %    Health/Function % Change  -24.76 %    Socioeconomic Pre  26.5 %    Socioeconomic Post  25.57 %    Socioeconomic % Change   -3.51 %    Psych/Spiritual Pre  26.79 %    Psych/Spiritual Post  22.14 %    Psych/Spiritual % Change  -17.36 %    Family Pre  28.8 %    Family Post  25.2 %    Family % Change  -12.5 %    GLOBAL Pre  24.04 %    GLOBAL Post  20.33 %    GLOBAL % Change  -15.43 %      Scores of 19 and below usually indicate a poorer quality of life in these areas.  A difference of  2-3 points is a clinically meaningful difference.  A difference of 2-3 points in the total score of the Quality of Life Index has been associated with significant improvement in overall quality of life, self-image, physical symptoms, and general health in studies assessing change in quality of life.  PHQ-9: Recent Review Flowsheet Data    Depression screen [facility] 2/9 03/14/2018 01/03/2018 04/25/2017 02/29/2016    Decreased Interest 0 0 0 0   Down, Depressed, Hopeless 0 0 0 0   PHQ - 2 Score 0 0 0 0   Altered sleeping 0 0 0 -   Tired, decreased energy 1 3 0 -   Change in appetite 1 0 0 -   Feeling bad or failure about yourself  0 0 0 -   Trouble concentrating 0 0 0 -   Moving slowly or fidgety/restless 0 0 0 -   Suicidal thoughts 0 0 0 -   PHQ-9 Score 2 3 0 -   Difficult doing work/chores - Not difficult at all - -     Interpretation of Total Score  Total Score Depression Severity:  1-4 = Minimal depression, 5-9 = Mild depression, 10-14 = Moderate depression, 15-19 = Moderately severe depression, 20-27 = Severe depression   Psychosocial Evaluation and Intervention: Psychosocial Evaluation - 01/31/18 0943      Psychosocial Evaluation & Interventions   Interventions  Encouraged to exercise with the program and follow exercise prescription;Relaxation education;Stress management education    Comments  Counselor met with Ms. Tobin Chad Jackelyn Poling) today for initial psychosocial evaluation.   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  She has a strong support system with a spouse of 39 years; a daughter and son locally and a son in [place].  Jackelyn Poling has multiple health issues with bone spurs/scoliosis; and hip replacement ~4 years ago.  She sleeps well and has a good appetite. Jackelyn Poling denies a history of depression or anxiety or any current symptoms.  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  She is working 7 days/week in the meantime.  Jackelyn Poling has goals to be more active overall and is looking into a water aerobics program  due to her joint pain.  She will be followed by staff.    Expected Outcomes  Short - Jackelyn Poling will practice positive self care with relaxation and boundaries around her time during tax season.  Long - Debbie will exercise consistently to improve her  health and mental health/manage stress.    Continue Psychosocial Services   Follow up required by staff       Psychosocial Re-Evaluation: Psychosocial Re-Evaluation    [place] Name 02/26/18 [phone]             Psychosocial Re-Evaluation   Current issues with  Current Stress Concerns       Comments  Jackelyn Poling is much happier now that tax season is over.  Her stress levels have now gone down significantly!!  She continues to sleep well and is enjoying coming to class. She is going to look into going to [facility] to be able to go ahead and get into pool now.        Expected Outcomes  Short: Enjoy end of tax season!  Long: Look into getting into pool.        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  Her stress levels have now gone down significantly!!  She continues to sleep well and is enjoying coming to class. She is going to look into going to [facility] to be able to go ahead and get into pool now.     Expected Outcomes  Short: Enjoy end of tax season!  Long: Look into getting into pool.     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   Vocational Rehab Evaluation & Intervention: Vocational Rehab - 01/03/18 1511      Initial Vocational Rehab Evaluation & Intervention   Assessment shows need for Vocational Rehabilitation  No       Education: Education Goals: Education classes will be provided on a variety of topics geared toward better understanding of heart health and risk factor modification. Participant will state understanding/return demonstration of topics presented as noted by education test scores.  Learning Barriers/Preferences: Learning Barriers/Preferences - 01/03/18 1513      Learning Barriers/Preferences   Learning Barriers  None    Learning Preferences  Individual Instruction       Education Topics:  AED/CPR: - Group verbal and written instruction with the use of models to demonstrate the basic use of the AED with the basic  ABC's of resuscitation.   Cardiac Rehab from 03/21/2018 in [facility] Cardiac and Pulmonary Rehab  Date  03/21/18  Educator  CE  Instruction Review Code  1- Verbalizes Understanding      General Nutrition Guidelines/Fats and Fiber: -Group instruction provided by verbal, written material, models and posters to present the general guidelines for heart healthy nutrition. Gives an explanation and review of dietary fats and fiber.   Controlling Sodium/Reading Food Labels: -Group verbal and written material supporting the discussion of sodium use in heart healthy nutrition. Review and explanation with models, verbal and written materials for utilization of the food label.   Cardiac Rehab from 03/21/2018 in [facility] Cardiac and Pulmonary Rehab  Date  03/19/18  Educator  PI  Instruction Review Code  1- Verbalizes Understanding      Exercise Physiology & General Exercise Guidelines: - Group verbal and written instruction with models to review the exercise physiology of the cardiovascular system and associated  critical values. Provides general exercise guidelines with specific guidelines to those with heart or lung disease.    Cardiac Rehab from 03/21/2018 in [facility] Cardiac and Pulmonary Rehab  Date  02/07/18  Educator  [facility]  Instruction Review Code  1- Verbalizes Understanding      Aerobic Exercise & Resistance Training: - Gives group verbal and written instruction on the various components of exercise. Focuses on aerobic and resistive training programs and the benefits of this training and how to safely progress through these programs..   Cardiac Rehab from 03/21/2018 in [facility] Cardiac and Pulmonary Rehab  Date  02/12/18  Educator  AS  Instruction Review Code  1- Verbalizes Understanding      Flexibility, Balance, Mind/Body Relaxation: Provides group verbal/written instruction on the benefits of flexibility and balance training, including mind/body exercise modes such as yoga, pilates and tai chi.  Demonstration and skill practice provided.   Cardiac Rehab from 03/21/2018 in [facility] Cardiac and Pulmonary Rehab  Date  02/19/18  Educator  AS  Instruction Review Code  1- Verbalizes Understanding      Stress and Anxiety: - Provides group verbal and written instruction about the health risks of elevated stress and causes of high stress.  Discuss the correlation between heart/lung disease and anxiety and treatment options. Review healthy ways to manage with stress and anxiety.   Cardiac Rehab from 03/21/2018 in [facility] Cardiac and Pulmonary Rehab  Date  02/26/18  Educator  [facility]  Instruction Review Code  1- Verbalizes Understanding      Depression: - Provides group verbal and written instruction on the correlation between heart/lung disease and depressed mood, treatment options, and the stigmas associated with seeking treatment.   Cardiac Rehab from 03/21/2018 in [facility] Cardiac and Pulmonary Rehab  Date  02/14/18  Educator  [facility]  Instruction Review Code  1- Verbalizes Understanding      Anatomy &  Physiology of the Heart: - Group verbal and written instruction and models provide basic cardiac anatomy and physiology, with the coronary electrical and arterial systems. Review of Valvular disease and Heart Failure   Cardiac Rehab from 03/21/2018 in [facility] Cardiac and Pulmonary Rehab  Date  02/28/18  Educator  CE  Instruction Review Code  1- Verbalizes Understanding      Cardiac Procedures: - Group verbal and written instruction to review commonly prescribed medications for heart disease. Reviews the medication, class of the drug, and side effects. Includes the steps to properly store meds and maintain the prescription regimen. (beta blockers and nitrates)  Cardiac Rehab from 03/21/2018 in [facility] Cardiac and Pulmonary Rehab  Date  03/14/18  Educator  CE  Instruction Review Code  1- Verbalizes Understanding      Cardiac Medications I: - Group verbal and written instruction to review commonly prescribed medications for heart disease. Reviews the medication, class of the drug, and side effects. Includes the steps to properly store meds and maintain the prescription regimen.   Cardiac Rehab from 03/21/2018 in [facility] Cardiac and Pulmonary Rehab  Date  03/05/18  Educator  SB  Instruction Review Code  1- Verbalizes Understanding      Cardiac Medications II: -Group verbal and written instruction to review commonly prescribed medications for heart disease. Reviews the medication, class of the drug, and side effects. (all other drug classes)    Go Sex-Intimacy & Heart Disease, Get SMART - Goal Setting: - Group verbal and written instruction through game format to discuss heart disease and the return to sexual intimacy. Provides group verbal and written material to discuss and apply goal setting through the application of the S.M.A.R.T. Method.   Cardiac Rehab from 03/21/2018 in [facility] Cardiac and Pulmonary Rehab  Date  03/14/18  Educator  CE  Instruction Review Code  1- Verbalizes Understanding       Other Matters of the Heart: - Provides group verbal, written materials and models to describe Stable Angina and Peripheral Artery. Includes description of the disease process and treatment options available to the cardiac patient.   Exercise & Equipment Safety: - Individual verbal instruction and demonstration of equipment use and safety with use of the equipment.   Cardiac Rehab from 03/21/2018 in [facility] Cardiac and Pulmonary Rehab  Date  01/03/18  Educator  [facility]  Instruction Review Code  1- Verbalizes Understanding      Infection Prevention: - Provides verbal and written material to individual with discussion of infection control including proper hand washing and proper equipment cleaning during exercise session.   Cardiac Rehab from 03/21/2018 in [facility] Cardiac and Pulmonary Rehab  Date  01/03/18  Educator  [facility]  Instruction Review Code  1- Verbalizes Understanding      Falls Prevention: - Provides verbal and written material to individual with discussion of falls prevention and safety.   Cardiac Rehab from 03/21/2018 in [facility] Cardiac and Pulmonary Rehab  Date  01/03/18  Educator  [facility]  Instruction Review Code  1- Verbalizes Understanding      Diabetes: - Individual verbal and written instruction to review signs/symptoms of diabetes, desired ranges of glucose level fasting, after meals and with exercise. Acknowledge that pre and post exercise glucose checks will be done for 3 sessions at entry of program.   Know Your Numbers and Risk Factors: -Group verbal and written instruction about important numbers in your health.  Discussion of what are risk factors and how they play a role in the disease process.  Review of Cholesterol, Blood Pressure, Diabetes, and BMI and the role they play in your overall health.   Sleep Hygiene: -Provides group verbal and written instruction about how sleep can affect your health.  Define sleep hygiene, discuss sleep cycles and impact of sleep habits.  Review good sleep hygiene tips.    Cardiac Rehab from 03/21/2018 in [facility] Cardiac and Pulmonary Rehab  Date  03/12/18  Educator  [facility]  Instruction Review Code  1- Verbalizes Understanding      Other: -Provides group and verbal instruction on various topics (see comments)   Knowledge Questionnaire Score: Knowledge Questionnaire Score -  03/14/18 1201      Knowledge Questionnaire Score   Pre Score  26/28    Post Score  27/28       Core Components/Risk Factors/Patient Goals at Admission: Personal Goals and Risk Factors at Admission - 01/03/18 1352      Core Components/Risk Factors/Patient Goals on Admission    Weight Management  Yes;Obesity;Weight Loss    Intervention  Weight Management: Develop a combined nutrition and exercise program designed to reach desired caloric intake, while maintaining appropriate intake of nutrient and fiber, sodium and fats, and appropriate energy expenditure required for the weight goal.;Weight Management: Provide education and appropriate resources to help participant work on and attain dietary goals.;Weight Management/Obesity: Establish reasonable short term and long term weight goals.;Obesity: Provide education and appropriate resources to help participant work on and attain dietary goals.    Admit Weight  250 lb (113.4 kg)    Goal Weight: Short Term  245 lb (111.1 kg)    Goal Weight: Long Term  200 lb (90.7 kg)    Expected Outcomes  Short Term: Continue to assess and modify interventions until short term weight is achieved;Long Term: Adherence to nutrition and physical activity/exercise program aimed toward attainment of established weight goal    Heart Failure  Yes    Intervention  Provide a combined exercise and nutrition program that is supplemented with education, support and counseling about heart failure. Directed toward relieving symptoms such as shortness of breath, decreased exercise tolerance, and extremity edema.    Expected Outcomes  Improve  functional capacity of life;Short term: Attendance in program 2-3 days a week with increased exercise capacity. Reported lower sodium intake. Reported increased fruit and vegetable intake. Reports medication compliance.;Short term: Daily weights obtained and reported for increase. Utilizing diuretic protocols set by physician.;Long term: Adoption of self-care skills and reduction of barriers for early signs and symptoms recognition and intervention leading to self-care maintenance.    Hypertension  Yes    Intervention  Provide education on lifestyle modifcations including regular physical activity/exercise, weight management, moderate sodium restriction and increased consumption of fresh fruit, vegetables, and low fat dairy, alcohol moderation, and smoking cessation.;Monitor prescription use compliance.    Expected Outcomes  Short Term: Continued assessment and intervention until BP is < 140/34m HG in hypertensive participants. < 130/830mHG in hypertensive participants with diabetes, heart failure or chronic kidney disease.;Long Term: Maintenance of blood pressure at goal levels.    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 Teach and support self-help strategies.;Refer participants experiencing significant psychosocial distress to appropriate mental health specialists for further evaluation and treatment. When possible, include family members and significant others in education/counseling sessions.    Expected Outcomes  Long Term: Emotional wellbeing is indicated by absence of clinically significant psychosocial distress or social isolation.;Short Term: Participant demonstrates changes in health-related behavior, relaxation and other stress management skills, ability to obtain effective social support, and  compliance with psychotropic medications if prescribed.       Core Components/Risk Factors/Patient Goals Review:  Goals and Risk Factor Review    Row Name 01/31/18 0941 02/26/18 0923           Core Components/Risk Factors/Patient Goals Review   Personal Goals Review  Weight Management/Obesity;Lipids;Hypertension  Weight Management/Obesity;Lipids;Hypertension      Review  Debbie  is feeling better overall since beginning exercise.  She says even other people have noticed she seems better.  She started taking Tylenol before exercise to help with back pain.  Her goal is to be able to do water aerobics when she finishes HT.  She is scheduled to meet with RD 4/16  Jackelyn Poling has been doing well in rehab.  Her weight was doing better, but since her medicaiton change last week her weight has gone back up and she has edema in her ankles and feet.  She was encouraged to contact her doctor about her swelling.  Her doctor thought she was doing better which was why he pulled some of her meds back.  Also, since her blood pressures have been good, she stopped checking them at home.  We talked about continuing to check her blood pressures and keeping up with them still.  She has not had any lipid work done since December.       Expected Outcomes  Short - Jackelyn Poling will continue to attend HT and will meet with RD  Long - Debbie will exercise independently  Short: Continue to work on weight and call doctor about swelling.  Long: Continue to monitor risk factors.          Core Components/Risk Factors/Patient Goals at Discharge (Final Review):  Goals and Risk Factor Review - 02/26/18 0923      Core Components/Risk Factors/Patient Goals Review   Personal Goals Review  Weight Management/Obesity;Lipids;Hypertension    Review  Jackelyn Poling has been doing well in rehab.  Her weight was doing better, but since her medicaiton change last week her weight has gone back up and she has edema in her ankles and feet.  She was encouraged  to contact her doctor about her swelling.  Her doctor thought she was doing better which was why he pulled some of her meds back.  Also, since her blood pressures have been good, she stopped checking them at home.  We talked about continuing to check her blood pressures and keeping up with them still.  She has not had any lipid work done since December.     Expected Outcomes  Short: Continue to work on weight and call doctor about swelling.  Long: Continue to monitor risk factors.        ITP Comments: ITP Comments    Row Name 01/03/18 1347 01/23/18 0631 02/20/18 0547 03/20/18 0610 03/21/18 0830   ITP Comments  Med Review completed. Initial ITP created. Diagnosis can be found in Care Everywhere 11/15/17  30 Day review. Continue with ITP unless directed changes per Medical Director review.   New to program  30 day review. Continue with ITP unless directed changes per Medical Director  30 day review. Continue with ITP unless directed changes per Medical Director  Discharge ITP sent and signed by Dr. Sabra Heck.  Discharge Summary routed to PCP and cardiologist.      Comments: discharge ITP

## 2018-03-27 ENCOUNTER — Ambulatory Visit (INDEPENDENT_AMBULATORY_CARE_PROVIDER_SITE_OTHER): Payer: BC Managed Care – PPO | Admitting: Family Medicine

## 2018-03-27 ENCOUNTER — Encounter: Payer: Self-pay | Admitting: Family Medicine

## 2018-03-27 ENCOUNTER — Ambulatory Visit: Payer: BC Managed Care – PPO

## 2018-03-27 VITALS — BP 118/72 | HR 87 | Temp 99.9°F | Resp 18 | Wt 249.0 lb

## 2018-03-27 DIAGNOSIS — I050 Rheumatic mitral stenosis: Secondary | ICD-10-CM

## 2018-03-27 DIAGNOSIS — R509 Fever, unspecified: Secondary | ICD-10-CM | POA: Diagnosis not present

## 2018-03-27 LAB — POCT INR
INR: 4.4 — AB (ref 2.0–3.0)
PT: 52.4

## 2018-03-27 MED ORDER — CEFUROXIME AXETIL 500 MG PO TABS: 500.0000 mg | ORAL_TABLET | Freq: Two times a day (BID) | ORAL | 0 refills | Status: AC

## 2018-03-27 NOTE — Progress Notes (Signed)
Patient: Victoria Davila Female    DOB: 01-Nov-1955   62 y.o.   MRN: [id number] Visit Date: 03/27/2018  Today's Provider: Mila Merry, MD   Chief Complaint  Patient presents with  . Fever   Subjective:    Fever   This is a new problem. The current episode started today. Maximum temperature: 100.5. The temperature was taken using an oral thermometer. Pertinent negatives include no abdominal pain, chest pain, nausea, urinary pain or vomiting. Associated symptoms comments: Sinus pressure.  Woke up with chills last night.   Had MVP replacement in January and had several similar episodes of fever  Was around friend this weekend with cold symptoms. No cough or sore throat, but has had some pressure above both eyes in her sinuses.      No Known Allergies   Current Outpatient Medications:  .  acetaminophen (TYLENOL) 500 MG tablet, Take 1,000 mg by mouth every 6 (six) hours as needed for mild pain or moderate pain., Disp: , Rfl:  .  aspirin 81 MG chewable tablet, Chew 81 mg by mouth daily., Disp: , Rfl:  .  DILANTIN 100 MG ER capsule, Take 4 capsules (400 mg total) by mouth daily., Disp: 360 capsule, Rfl: 3 .  folic acid (FOLVITE) 1 MG tablet, Take 1 mg by mouth daily., Disp: , Rfl:  .  gabapentin (NEURONTIN) 100 MG capsule, Take 100 mg by mouth 3 (three) times daily., Disp: , Rfl:  .  metoprolol succinate (TOPROL-XL) 25 MG 24 hr tablet, Take 25 mg by mouth daily., Disp: , Rfl:  .  Multiple Vitamin (MULTIVITAMIN WITH MINERALS) TABS tablet, Take 1 tablet by mouth at bedtime., Disp: , Rfl:  .  Omega-3 Fatty Acids (FISH OIL) 1200 MG CAPS, Take 1 capsule by mouth 2 (two) times daily. , Disp: , Rfl:  .  potassium chloride (K-DUR) 10 MEQ tablet, Take 3 tablets (30 mEq total) by mouth 2 (two) times daily., Disp: 180 tablet, Rfl: 1 .  psyllium (REGULOID) 0.52 G capsule, Take 0.52 g by mouth 2 (two) times daily., Disp: , Rfl:  .  torsemide (DEMADEX) 20 MG tablet, Take 2 tablets (40 mg  total) by mouth 2 (two) times daily., Disp: 60 tablet, Rfl: 4 .  valACYclovir (VALTREX) 1000 MG tablet, Take 1 tablet (1,000 mg total) by mouth 2 (two) times daily. For one day., Disp: 60 tablet, Rfl: 5 .  warfarin (COUMADIN) 3 MG tablet, Take 2 tablets (6 mg total) by mouth daily., Disp: 60 tablet, Rfl: 5 .  warfarin (COUMADIN) 4 MG tablet, TAKE 1 TABLET EVERY DAY WITH A 3MG  TABLET, Disp: 30 tablet, Rfl: 5 .  warfarin (COUMADIN) 5 MG tablet, Take 1 tablet (5 mg total) by mouth daily., Disp: 30 tablet, Rfl: 3 .  warfarin (COUMADIN) 6 MG tablet, Take 1 tablet (6 mg total) by mouth daily., Disp: 30 tablet, Rfl: 12  Review of Systems  Constitutional: Positive for chills (last night), fatigue and fever. Negative for appetite change.  HENT: Positive for sinus pressure.   Respiratory: Negative for chest tightness and shortness of breath.   Cardiovascular: Negative for chest pain and palpitations.  Gastrointestinal: Negative for abdominal pain, nausea and vomiting.  Genitourinary: Negative for dysuria, flank pain, frequency, pelvic pain and urgency.  Neurological: Negative for dizziness and weakness.    Social History   Tobacco Use  . Smoking status: Never Smoker  . Smokeless tobacco: Never Used  Substance Use Topics  . Alcohol  use: No    Comment: OCCASIONAL WINE ONLY   Objective:   BP 118/72 (BP Location: Left Arm, Patient Position: Sitting, Cuff Size: Large)   Pulse 87   Temp 99.9 F (37.7 C) (Oral)   Resp 18   Wt 249 lb (112.9 kg)   SpO2 97% Comment: room air  BMI 44.11 kg/m  Vitals:   03/27/18 1051  BP: 118/72  Pulse: 87  Resp: 18  Temp: 99.9 F (37.7 C)  TempSrc: Oral  SpO2: 97%  Weight: 249 lb (112.9 kg)     Physical Exam  General Appearance:    Alert, cooperative, no distress  HENT:   bilateral TM normal without fluid or infection, neck without nodes, throat normal without erythema or exudate, frontal sinuses tender and nasal mucosa congested  Eyes:    PERRL,  conjunctiva/corneas clear, EOM's intact       Lungs:     Clear to auscultation bilaterally, respirations unlabored  Heart:    Regular rate and rhythm, mechanical click faint systolic murmur.   Neurologic:   Awake, alert, oriented x 3. No apparent focal neurological           defect.           Assessment & Plan:     1. Fever, unspecified fever cause No sign of systemic infection. She may have early sinusitis with her frontal sinus congestion.  - cefUROXime (CEFTIN) 500 MG tablet; Take 1 tablet (500 mg total) by mouth 2 (two) times daily with a meal for 10 days.  Dispense: 20 tablet; Refill: 0 Call if symptoms change or if not rapidly improving.     2. Mitral valve stenosis, unspecified etiology  - POCT INR INR       Mila Merry, MD  [facility]

## 2018-03-27 NOTE — Patient Instructions (Signed)
Description   Hold for 2 days then alternating 5 mg and 6 mg. Follow up in 1 week

## 2018-03-29 ENCOUNTER — Ambulatory Visit: Payer: Self-pay

## 2018-04-03 ENCOUNTER — Ambulatory Visit (INDEPENDENT_AMBULATORY_CARE_PROVIDER_SITE_OTHER): Payer: BC Managed Care – PPO

## 2018-04-03 DIAGNOSIS — I050 Rheumatic mitral stenosis: Secondary | ICD-10-CM

## 2018-04-03 DIAGNOSIS — I052 Rheumatic mitral stenosis with insufficiency: Secondary | ICD-10-CM

## 2018-04-03 LAB — POCT INR
INR: 1.8 — AB (ref 2.0–3.0)
PT: 21.7

## 2018-04-03 NOTE — Patient Instructions (Signed)
Increase to 6mg  a day and recheck in two weeks.

## 2018-04-17 ENCOUNTER — Ambulatory Visit (INDEPENDENT_AMBULATORY_CARE_PROVIDER_SITE_OTHER): Payer: BC Managed Care – PPO

## 2018-04-17 DIAGNOSIS — I050 Rheumatic mitral stenosis: Secondary | ICD-10-CM

## 2018-04-17 LAB — POCT INR
INR: 3.2 — AB (ref 2.0–3.0)
PT: 37.9

## 2018-04-17 NOTE — Patient Instructions (Signed)
Description   6mg  daily and  recheck in two weeks.

## 2018-05-01 ENCOUNTER — Ambulatory Visit (INDEPENDENT_AMBULATORY_CARE_PROVIDER_SITE_OTHER): Payer: BC Managed Care – PPO

## 2018-05-01 DIAGNOSIS — I050 Rheumatic mitral stenosis: Secondary | ICD-10-CM | POA: Diagnosis not present

## 2018-05-01 LAB — POCT INR
INR: 3.3 — AB (ref 2.0–3.0)
PT: 39.3

## 2018-05-01 NOTE — Patient Instructions (Signed)
Description   6mg  daily and  recheck in 2-3 weeks.

## 2018-05-22 ENCOUNTER — Ambulatory Visit (INDEPENDENT_AMBULATORY_CARE_PROVIDER_SITE_OTHER): Payer: BC Managed Care – PPO

## 2018-05-22 DIAGNOSIS — I050 Rheumatic mitral stenosis: Secondary | ICD-10-CM | POA: Diagnosis not present

## 2018-05-22 DIAGNOSIS — I052 Rheumatic mitral stenosis with insufficiency: Secondary | ICD-10-CM | POA: Diagnosis not present

## 2018-05-22 LAB — POCT INR
INR: 4 — AB (ref 2.0–3.0)
PT: 47.7

## 2018-05-22 NOTE — Patient Instructions (Signed)
Description   Take 5 mg daily and return in 1 week to recheck.

## 2018-05-29 ENCOUNTER — Ambulatory Visit (INDEPENDENT_AMBULATORY_CARE_PROVIDER_SITE_OTHER): Payer: BC Managed Care – PPO

## 2018-05-29 ENCOUNTER — Ambulatory Visit: Payer: BC Managed Care – PPO

## 2018-05-29 DIAGNOSIS — I052 Rheumatic mitral stenosis with insufficiency: Secondary | ICD-10-CM

## 2018-05-29 DIAGNOSIS — I050 Rheumatic mitral stenosis: Secondary | ICD-10-CM

## 2018-05-29 LAB — POCT INR
INR: 2.4 (ref 2.0–3.0)
PT: 28.2

## 2018-05-29 NOTE — Patient Instructions (Signed)
Description   6 mg daily except 5 mg on M W F

## 2018-06-12 ENCOUNTER — Ambulatory Visit (INDEPENDENT_AMBULATORY_CARE_PROVIDER_SITE_OTHER): Payer: BC Managed Care – PPO

## 2018-06-12 DIAGNOSIS — I052 Rheumatic mitral stenosis with insufficiency: Secondary | ICD-10-CM

## 2018-06-12 DIAGNOSIS — I050 Rheumatic mitral stenosis: Secondary | ICD-10-CM

## 2018-06-12 LAB — POCT INR
INR: 3.2 — AB (ref 2.0–3.0)
PT: 38.5

## 2018-06-12 NOTE — Patient Instructions (Signed)
Continue current dose of 6mg  a day except 5mg  on M, W, F.  Recheck in 3 weeks.

## 2018-07-03 ENCOUNTER — Ambulatory Visit (INDEPENDENT_AMBULATORY_CARE_PROVIDER_SITE_OTHER): Payer: BC Managed Care – PPO

## 2018-07-03 DIAGNOSIS — I050 Rheumatic mitral stenosis: Secondary | ICD-10-CM | POA: Diagnosis not present

## 2018-07-03 DIAGNOSIS — I052 Rheumatic mitral stenosis with insufficiency: Secondary | ICD-10-CM | POA: Diagnosis not present

## 2018-07-03 LAB — POCT INR
INR: 2.6 (ref 2.0–3.0)
PT: 30.9

## 2018-07-03 NOTE — Patient Instructions (Signed)
Continue 6mg  daily except 5mg  M, W, F.  Recheck in 4 weeks.

## 2018-07-31 ENCOUNTER — Ambulatory Visit (INDEPENDENT_AMBULATORY_CARE_PROVIDER_SITE_OTHER): Payer: BC Managed Care – PPO

## 2018-07-31 DIAGNOSIS — I050 Rheumatic mitral stenosis: Secondary | ICD-10-CM | POA: Diagnosis not present

## 2018-07-31 DIAGNOSIS — I052 Rheumatic mitral stenosis with insufficiency: Secondary | ICD-10-CM

## 2018-07-31 LAB — POCT INR
INR: 2.9 (ref 2.0–3.0)
PT: 34.3

## 2018-07-31 NOTE — Patient Instructions (Signed)
Description   Continue 6mg  daily except 5mg  M, W, F.  Recheck in four weeks.

## 2018-08-28 ENCOUNTER — Ambulatory Visit (INDEPENDENT_AMBULATORY_CARE_PROVIDER_SITE_OTHER): Payer: BC Managed Care – PPO

## 2018-08-28 DIAGNOSIS — Z23 Encounter for immunization: Secondary | ICD-10-CM

## 2018-08-28 DIAGNOSIS — I050 Rheumatic mitral stenosis: Secondary | ICD-10-CM | POA: Diagnosis not present

## 2018-08-28 LAB — POCT INR: INR: 3.3 — AB (ref 2.0–3.0)

## 2018-09-24 ENCOUNTER — Ambulatory Visit (INDEPENDENT_AMBULATORY_CARE_PROVIDER_SITE_OTHER): Payer: BC Managed Care – PPO

## 2018-09-24 DIAGNOSIS — I050 Rheumatic mitral stenosis: Secondary | ICD-10-CM

## 2018-09-24 LAB — POCT INR
INR: 3 (ref 2.0–3.0)
PT: 35.8

## 2018-09-24 NOTE — Patient Instructions (Signed)
Description   Dx: Mitral Valve Stenosis Current coumadin Dose: 6mg  daily, except 5mg  Monday, Wednesday, Friday PT: 35.8 INR: 3.0 Today's Changes: NO CHANGE Recheck: 4 weeks

## 2018-10-21 ENCOUNTER — Encounter: Payer: Self-pay | Admitting: Family Medicine

## 2018-10-21 ENCOUNTER — Ambulatory Visit (INDEPENDENT_AMBULATORY_CARE_PROVIDER_SITE_OTHER): Payer: BC Managed Care – PPO | Admitting: Family Medicine

## 2018-10-21 VITALS — BP 124/74 | HR 67 | Temp 98.6°F | Resp 16

## 2018-10-21 DIAGNOSIS — I050 Rheumatic mitral stenosis: Secondary | ICD-10-CM

## 2018-10-21 DIAGNOSIS — J069 Acute upper respiratory infection, unspecified: Secondary | ICD-10-CM | POA: Diagnosis not present

## 2018-10-21 DIAGNOSIS — I342 Nonrheumatic mitral (valve) stenosis: Secondary | ICD-10-CM | POA: Diagnosis not present

## 2018-10-21 DIAGNOSIS — I340 Nonrheumatic mitral (valve) insufficiency: Secondary | ICD-10-CM | POA: Diagnosis not present

## 2018-10-21 LAB — POCT INR
INR: 2.9 (ref 2.0–3.0)
PT: 35.1

## 2018-10-21 MED ORDER — CEFDINIR 300 MG PO CAPS: 300.0000 mg | ORAL_CAPSULE | Freq: Two times a day (BID) | ORAL | 0 refills | Status: DC

## 2018-10-21 MED ORDER — HYDROCOD POLST-CPM POLST ER 10-8 MG/5ML PO SUER: 5.0000 mL | Freq: Two times a day (BID) | ORAL | 0 refills | Status: DC | PRN

## 2018-10-21 NOTE — Progress Notes (Signed)
Patient: Victoria Davila Female    DOB: [birth year]   63 y.o.   MRN: [id number] Visit Date: 10/21/2018  Today's Provider: Dortha Kern, PA   Chief Complaint  Patient presents with  . Sore Throat   Subjective:     Sore Throat   This is a new problem. The current episode started yesterday. There has been no fever. Associated symptoms include congestion, coughing, a hoarse voice, neck pain and trouble swallowing. Pertinent negatives include no abdominal pain, diarrhea, drooling, ear discharge, ear pain, headaches, plugged ear sensation, shortness of breath, stridor, swollen glands or vomiting. She has had no exposure to strep or mono. She has tried nothing for the symptoms.   Patient states she has had a cough and sore throat for about a day. Patient states she also has symptoms of runny nose, slight sinus pressure, and hoarseness. No fever. Patient has not taken any medications due to her not knowing what to take otc.  Past Medical History:  Diagnosis Date  . Dysrhythmia    A FIB / FOLLOWED BY DR Nolon Rod AT [facility]  . Edema of both legs    CHRONIC  . Hyperlipidemia   . Hypertension   . Mitral valve disorder    "deformed"  . Neuromuscular disorder (HCC)   . Rheumatoid arthritis (HCC)    RECENT DX - HAS NOT YET SEEN RHEUMATOLOGIST  . Seizures (HCC)    LAST SEIZURE JAN 1995  . Shortness of breath dyspnea    WITH EXERTION   Past Surgical History:  Procedure Laterality Date  . ABDOMINAL HYSTERECTOMY  2012  . CARDIAC ELECTROPHYSIOLOGY MAPPING AND ABLATION    . CARDIAC VALVE SURGERY  2001   "heart valve stretched" for mitral valve stenosis  . CERVICAL POLYPECTOMY    . KNEE ARTHROSCOPY  2012   left  . TOTAL HIP ARTHROPLASTY Left 11/06/2014   Procedure: LEFT TOTAL HIP ARTHROPLASTY ANTERIOR APPROACH;  Surgeon: Kathryne Hitch, MD;  Location: [facility];  Service: Orthopedics;  Laterality: Left;   Family History  Problem Relation Age of Onset  . Diabetes Mother     . Hip fracture Mother   . Arthritis Father   . Dementia Father   . Cancer Sister        Thyroid Cancer s/p Thyroidectomy  . Lung disease Paternal Grandfather    No Known Allergies  Current Outpatient Medications:  .  acetaminophen (TYLENOL) 500 MG tablet, Take 1,000 mg by mouth every 6 (six) hours as needed for mild pain or moderate pain., Disp: , Rfl:  .  aspirin 81 MG chewable tablet, Chew 81 mg by mouth daily., Disp: , Rfl:  .  DILANTIN 100 MG ER capsule, Take 4 capsules (400 mg total) by mouth daily., Disp: 360 capsule, Rfl: 3 .  gabapentin (NEURONTIN) 100 MG capsule, Take 100 mg by mouth 3 (three) times daily., Disp: , Rfl:  .  metoprolol succinate (TOPROL-XL) 25 MG 24 hr tablet, Take 25 mg by mouth daily., Disp: , Rfl:  .  Multiple Vitamin (MULTIVITAMIN WITH MINERALS) TABS tablet, Take 1 tablet by mouth at bedtime., Disp: , Rfl:  .  Omega-3 Fatty Acids (FISH OIL) 1200 MG CAPS, Take 1 capsule by mouth 2 (two) times daily. , Disp: , Rfl:  .  potassium chloride (K-DUR) 10 MEQ tablet, Take 3 tablets (30 mEq total) by mouth 2 (two) times daily., Disp: 180 tablet, Rfl: 1 .  psyllium (REGULOID) 0.52 G capsule, Take 0.52  g by mouth 2 (two) times daily., Disp: , Rfl:  .  torsemide (DEMADEX) 20 MG tablet, Take 2 tablets (40 mg total) by mouth 2 (two) times daily., Disp: 60 tablet, Rfl: 4 .  valACYclovir (VALTREX) 1000 MG tablet, Take 1 tablet (1,000 mg total) by mouth 2 (two) times daily. For one day., Disp: 60 tablet, Rfl: 5 .  warfarin (COUMADIN) 3 MG tablet, Take 2 tablets (6 mg total) by mouth daily., Disp: 60 tablet, Rfl: 5 .  warfarin (COUMADIN) 4 MG tablet, TAKE 1 TABLET EVERY DAY WITH A 3MG  TABLET, Disp: 30 tablet, Rfl: 5 .  warfarin (COUMADIN) 5 MG tablet, Take 1 tablet (5 mg total) by mouth daily., Disp: 30 tablet, Rfl: 3 .  warfarin (COUMADIN) 6 MG tablet, Take 1 tablet (6 mg total) by mouth daily., Disp: 30 tablet, Rfl: 12 .  folic acid (FOLVITE) 1 MG tablet, Take 1 mg by mouth  daily., Disp: , Rfl:   Review of Systems  Constitutional: Negative for appetite change, chills, fatigue and fever.  HENT: Positive for congestion, hoarse voice, rhinorrhea, sinus pressure, sore throat, trouble swallowing and voice change. Negative for drooling, ear discharge, ear pain and sinus pain.   Respiratory: Positive for cough. Negative for chest tightness, shortness of breath and stridor.   Cardiovascular: Negative for chest pain and palpitations.  Gastrointestinal: Negative for abdominal pain, diarrhea, nausea and vomiting.  Musculoskeletal: Positive for neck pain.  Neurological: Negative for dizziness, weakness and headaches.   Social History   Tobacco Use  . Smoking status: Never Smoker  . Smokeless tobacco: Never Used  Substance Use Topics  . Alcohol use: No    Comment: OCCASIONAL WINE ONLY     Objective:   BP 124/74 (BP Location: Right Arm, Patient Position: Sitting, Cuff Size: Large)   Pulse 67   Temp 98.6 F (37 C) (Oral)   Resp 16   SpO2 97%    Wt Readings from Last 3 Encounters:  03/27/18 249 lb (112.9 kg)  03/07/18 249 lb 3.2 oz (113 kg)  01/22/18 247 lb (112 kg)   Vitals:   10/21/18 1420  BP: 124/74  Pulse: 67  Resp: 16  Temp: 98.6 F (37 C)  TempSrc: Oral  SpO2: 97%   Physical Exam Constitutional:      General: She is not in acute distress.    Appearance: She is well-developed.  HENT:     Head: Normocephalic and atraumatic.     Right Ear: Hearing, tympanic membrane and ear canal normal.     Left Ear: Hearing, tympanic membrane and ear canal normal.     Nose: Nose normal.     Mouth/Throat:     Mouth: Mucous membranes are moist.     Pharynx: Oropharynx is clear. Uvula midline.     Tonsils: No tonsillar exudate or tonsillar abscesses.  Eyes:     General: Lids are normal. No scleral icterus.       Right eye: No discharge.        Left eye: No discharge.     Conjunctiva/sclera: Conjunctivae normal.  Neck:     Musculoskeletal: Normal range  of motion and neck supple.  Cardiovascular:     Rate and Rhythm: Normal rate and regular rhythm.     Heart sounds: Normal heart sounds.  Pulmonary:     Effort: Pulmonary effort is normal. No respiratory distress.     Breath sounds: Normal breath sounds.  Musculoskeletal: Normal range of motion.  Lymphadenopathy:  Cervical: No cervical adenopathy.  Skin:    Findings: No lesion or rash.  Neurological:     Mental Status: She is alert and oriented to person, place, and time.  Psychiatric:        Speech: Speech normal.        Behavior: Behavior normal.        Thought Content: Thought content normal.       Assessment & Plan    1. URI with cough and congestion Onset the past couple days without fever. Some cough, rhinorrhea and slight sinus pressure. No sputum production with cough that is worse at night. Slightly cloudy maxillary sinuses with slight tenderness. Treat with Tussionex for cough control and add Omnicef for suspected infection. Increase fluid intake and recheck prn. - chlorpheniramine-HYDROcodone (TUSSIONEX PENNKINETIC ER) 10-8 MG/5ML SUER; Take 5 mLs by mouth every 12 (twelve) hours as needed for cough.  Dispense: 140 mL; Refill: 0 - cefdinir (OMNICEF) 300 MG capsule; Take 1 capsule (300 mg total) by mouth 2 (two) times daily.  Dispense: 14 capsule; Refill: 0  2. Mitral valve stenosis, unspecified etiology Stable and on anticoagulant therapy. Protime INR was 2.9 today. Will continue Coumadin 6 mg daily except 5 mg on M,W,F. Recheck level in a month. - POCT INR  3. Nonrheumatic mitral valve stenosis with insufficiency Stable BP and heart rate. No palpitations or chest pains. Continue present medical therapy.     Dortha Kern, PA  [facility]

## 2018-10-21 NOTE — Patient Instructions (Signed)
Dx: Mitral Valve Stenosis Current coumadin Dose: 6mg  daily, except 5mg  Monday, Wednesday, Friday PT: 35.1 INR: 2.9 Today's Changes: NO CHANGE Recheck: 4 weeks

## 2018-10-22 ENCOUNTER — Ambulatory Visit: Payer: Self-pay

## 2018-11-19 ENCOUNTER — Ambulatory Visit: Payer: BC Managed Care – PPO

## 2018-11-19 DIAGNOSIS — I340 Nonrheumatic mitral (valve) insufficiency: Secondary | ICD-10-CM

## 2018-11-19 DIAGNOSIS — I050 Rheumatic mitral stenosis: Secondary | ICD-10-CM

## 2018-11-19 DIAGNOSIS — I342 Nonrheumatic mitral (valve) stenosis: Secondary | ICD-10-CM

## 2018-11-19 LAB — POCT INR
AVAILABLE: 38.3
INR: 3.2 — AB (ref 2.0–3.0)

## 2018-11-19 NOTE — Patient Instructions (Signed)
Description   Dx: Mitral Valve Stenosis Current coumadin Dose: 6mg  daily, except 5mg  Monday, Wednesday, Friday  Today's Changes: NO CHANGE Recheck: 4 weeks

## 2018-11-28 ENCOUNTER — Other Ambulatory Visit: Payer: Self-pay | Admitting: Family Medicine

## 2018-11-28 DIAGNOSIS — I6789 Other cerebrovascular disease: Principal | ICD-10-CM

## 2018-11-28 DIAGNOSIS — G40909 Epilepsy, unspecified, not intractable, without status epilepticus: Secondary | ICD-10-CM

## 2018-12-03 ENCOUNTER — Telehealth: Payer: Self-pay | Admitting: Family Medicine

## 2018-12-03 DIAGNOSIS — G40909 Epilepsy, unspecified, not intractable, without status epilepticus: Secondary | ICD-10-CM

## 2018-12-03 DIAGNOSIS — I6789 Other cerebrovascular disease: Principal | ICD-10-CM

## 2018-12-03 MED ORDER — DILANTIN 100 MG PO CAPS: 400.0000 mg | ORAL_CAPSULE | Freq: Every day | ORAL | 11 refills | Status: DC

## 2018-12-03 NOTE — Telephone Encounter (Signed)
Pt needs a refill on her Dilantin 100 MG ER.  She only wants on month called in because of the cost.  She uses [facility]  Thanks teri

## 2018-12-03 NOTE — Telephone Encounter (Signed)
Patient was advised of medication changes and Dr.Gilbert agreed with the changes that patient requested. Also medication was send into the pharmacy.

## 2018-12-04 ENCOUNTER — Other Ambulatory Visit: Payer: Self-pay | Admitting: Family Medicine

## 2018-12-24 ENCOUNTER — Ambulatory Visit: Payer: BC Managed Care – PPO

## 2018-12-24 DIAGNOSIS — I050 Rheumatic mitral stenosis: Secondary | ICD-10-CM | POA: Diagnosis not present

## 2018-12-24 LAB — POCT INR
INR: 4.1 — AB (ref 2.0–3.0)
PT: 48.6

## 2018-12-24 NOTE — Patient Instructions (Signed)
Description   Dx: Mitral Valve Stenosis Current coumadin Dose: 6mg  daily, except 5mg  Monday, Wednesday, Friday  Today's Changes: 5mg  daily, except 6mg  Monday and Friday Recheck: 2 weeks

## 2019-01-07 ENCOUNTER — Ambulatory Visit: Payer: BC Managed Care – PPO

## 2019-01-07 DIAGNOSIS — I340 Nonrheumatic mitral (valve) insufficiency: Secondary | ICD-10-CM

## 2019-01-07 DIAGNOSIS — I050 Rheumatic mitral stenosis: Secondary | ICD-10-CM | POA: Diagnosis not present

## 2019-01-07 DIAGNOSIS — I342 Nonrheumatic mitral (valve) stenosis: Secondary | ICD-10-CM | POA: Diagnosis not present

## 2019-01-07 LAB — POCT INR
INR: 3.3 — AB (ref 2.0–3.0)
PT: 39.3

## 2019-01-07 NOTE — Patient Instructions (Signed)
Description   Dx: Mitral Valve Stenosis Current coumadin Dose: 6mg daily, except 5mg Monday, Wednesday, Friday  Today's Changes: 5mg daily, except 6mg Monday and Friday Recheck: 2 weeks    

## 2019-01-22 ENCOUNTER — Ambulatory Visit (INDEPENDENT_AMBULATORY_CARE_PROVIDER_SITE_OTHER): Payer: BC Managed Care – PPO | Admitting: *Deleted

## 2019-01-22 ENCOUNTER — Other Ambulatory Visit: Payer: Self-pay

## 2019-01-22 DIAGNOSIS — I050 Rheumatic mitral stenosis: Secondary | ICD-10-CM

## 2019-01-22 LAB — POCT INR
INR: 2.1 (ref 2.0–3.0)
PT: 25.4

## 2019-01-22 NOTE — Patient Instructions (Signed)
Dx: Mitral Valve Stenosis Current coumadin Dose: 5mg  daily, except 6mg  Monday, Wednesday, Friday  Today's Changes: 6mg  daily, except 5mg  Monday and Friday Recheck: 2 weeks

## 2019-02-04 ENCOUNTER — Ambulatory Visit (INDEPENDENT_AMBULATORY_CARE_PROVIDER_SITE_OTHER): Payer: BC Managed Care – PPO

## 2019-02-04 ENCOUNTER — Other Ambulatory Visit: Payer: Self-pay

## 2019-02-04 DIAGNOSIS — I050 Rheumatic mitral stenosis: Secondary | ICD-10-CM | POA: Diagnosis not present

## 2019-02-04 LAB — POCT INR
INR: 3.5 — AB (ref 2.0–3.0)
PT: 42.2

## 2019-02-04 NOTE — Patient Instructions (Signed)
Description   Dx: Mitral Valve Stenosis Current coumadin Dose: 5mg  daily, except 6mg  Monday, Wednesday, Friday  Today's Changes: No change continue 6mg  daily, except 5mg  Monday and Friday Recheck: 2 weeks

## 2019-02-05 ENCOUNTER — Ambulatory Visit: Payer: Self-pay

## 2019-02-06 ENCOUNTER — Other Ambulatory Visit: Payer: Self-pay | Admitting: Family Medicine

## 2019-02-06 DIAGNOSIS — I050 Rheumatic mitral stenosis: Secondary | ICD-10-CM

## 2019-02-18 ENCOUNTER — Other Ambulatory Visit: Payer: Self-pay

## 2019-02-18 ENCOUNTER — Ambulatory Visit (INDEPENDENT_AMBULATORY_CARE_PROVIDER_SITE_OTHER): Payer: BC Managed Care – PPO

## 2019-02-18 DIAGNOSIS — I050 Rheumatic mitral stenosis: Secondary | ICD-10-CM

## 2019-02-18 LAB — POCT INR
INR: 2.8 (ref 2.0–3.0)
PT: 33

## 2019-02-18 NOTE — Patient Instructions (Signed)
Description   Dx: Mitral Valve Stenosis Current coumadin Dose: Per patient 6 mg QD, except 5 mg Monday and Friday  Recheck: 4 weeks

## 2019-03-18 ENCOUNTER — Ambulatory Visit (INDEPENDENT_AMBULATORY_CARE_PROVIDER_SITE_OTHER): Payer: BC Managed Care – PPO

## 2019-03-18 ENCOUNTER — Other Ambulatory Visit: Payer: Self-pay

## 2019-03-18 DIAGNOSIS — I050 Rheumatic mitral stenosis: Secondary | ICD-10-CM

## 2019-03-18 LAB — POCT INR
INR: 5.5 — AB (ref 2.0–3.0)
PT: 65.5

## 2019-03-18 NOTE — Patient Instructions (Signed)
Description   Dx: Mitral Valve Stenosis Hold 2 days then take 5 mg daily  Recheck: 2 weeks

## 2019-04-01 ENCOUNTER — Ambulatory Visit (INDEPENDENT_AMBULATORY_CARE_PROVIDER_SITE_OTHER): Payer: BC Managed Care – PPO

## 2019-04-01 ENCOUNTER — Other Ambulatory Visit: Payer: Self-pay

## 2019-04-01 DIAGNOSIS — I050 Rheumatic mitral stenosis: Secondary | ICD-10-CM

## 2019-04-01 LAB — POCT INR
INR: 3 (ref 2.0–3.0)
PT: 36.4

## 2019-04-01 NOTE — Patient Instructions (Signed)
Description   Dx: Mitral Valve Stenosis Take 5 mg daily  Recheck: 4 weeks

## 2019-04-29 ENCOUNTER — Ambulatory Visit: Payer: Self-pay

## 2019-04-29 ENCOUNTER — Telehealth: Payer: Self-pay | Admitting: Family Medicine

## 2019-04-29 ENCOUNTER — Ambulatory Visit (INDEPENDENT_AMBULATORY_CARE_PROVIDER_SITE_OTHER): Payer: BC Managed Care – PPO

## 2019-04-29 ENCOUNTER — Other Ambulatory Visit: Payer: Self-pay

## 2019-04-29 DIAGNOSIS — I050 Rheumatic mitral stenosis: Secondary | ICD-10-CM

## 2019-04-29 DIAGNOSIS — I480 Paroxysmal atrial fibrillation: Secondary | ICD-10-CM

## 2019-04-29 LAB — POCT INR
INR: 4.4 — AB (ref 2.0–3.0)
PT: 52.2

## 2019-04-29 MED ORDER — WARFARIN SODIUM 4 MG PO TABS: ORAL_TABLET | ORAL | 5 refills | Status: DC

## 2019-04-29 NOTE — Telephone Encounter (Signed)
Pt needs refill on her Coumadin 4 mg  [facility]  Thanks teri

## 2019-04-29 NOTE — Patient Instructions (Signed)
Description   Dx: Mitral Valve Stenosis Current dose: 5 mg daily Changes: Hold for 2 days then 5 mg qd except 4 mg MWF Recheck: 2 weeks

## 2019-05-13 ENCOUNTER — Ambulatory Visit (INDEPENDENT_AMBULATORY_CARE_PROVIDER_SITE_OTHER): Payer: BC Managed Care – PPO

## 2019-05-13 ENCOUNTER — Other Ambulatory Visit: Payer: Self-pay

## 2019-05-13 DIAGNOSIS — I050 Rheumatic mitral stenosis: Secondary | ICD-10-CM

## 2019-05-13 LAB — POCT INR
INR: 2.1 (ref 2.0–3.0)
PT: 25.6

## 2019-05-13 NOTE — Patient Instructions (Signed)
Description   Dx: Mitral Valve Stenosis Current dose: 5 mg daily except 4 mg M W F. (pt reports taking 4mg  daily) Changes:  5 mg qd except 4 mg MWF Recheck: 2 weeks

## 2019-05-27 ENCOUNTER — Other Ambulatory Visit: Payer: Self-pay

## 2019-05-27 ENCOUNTER — Ambulatory Visit (INDEPENDENT_AMBULATORY_CARE_PROVIDER_SITE_OTHER): Payer: BC Managed Care – PPO

## 2019-05-27 DIAGNOSIS — I050 Rheumatic mitral stenosis: Secondary | ICD-10-CM | POA: Diagnosis not present

## 2019-05-27 LAB — POCT INR
INR: 3.8 — AB (ref 2.0–3.0)
Prothrombin Time: 45.6

## 2019-05-27 NOTE — Patient Instructions (Signed)
Description   Dx: Mitral Valve Stenosis Current dose: 5 mg daily except 4 mg M W F.  Changes:Hold for 2 days than resume at  5 mg qd except 4 mg M,W,F Recheck: 3 weeks

## 2019-06-17 ENCOUNTER — Ambulatory Visit (INDEPENDENT_AMBULATORY_CARE_PROVIDER_SITE_OTHER): Payer: BC Managed Care – PPO

## 2019-06-17 ENCOUNTER — Other Ambulatory Visit: Payer: Self-pay

## 2019-06-17 DIAGNOSIS — I050 Rheumatic mitral stenosis: Secondary | ICD-10-CM

## 2019-06-17 LAB — POCT INR
INR: 3.5 — AB (ref 2.0–3.0)
PT: 42.5

## 2019-06-17 NOTE — Patient Instructions (Signed)
Description   Dx: Mitral Valve Stenosis Current dose: 4 mg M W F, except 5mg  qd (T,Thur, Sat, Sun)  Changes:No changes Recheck: 3 weeks

## 2019-06-20 ENCOUNTER — Encounter: Payer: Self-pay | Admitting: Physician Assistant

## 2019-06-20 ENCOUNTER — Ambulatory Visit: Payer: BC Managed Care – PPO | Admitting: Physician Assistant

## 2019-06-20 ENCOUNTER — Other Ambulatory Visit: Payer: Self-pay

## 2019-06-20 VITALS — BP 113/81 | HR 108 | Temp 98.7°F | Resp 16 | Wt 246.0 lb

## 2019-06-20 DIAGNOSIS — T24111A Burn of first degree of right thigh, initial encounter: Secondary | ICD-10-CM

## 2019-06-20 MED ORDER — SILVER SULFADIAZINE 1 % EX CREA: 1.0000 "application " | TOPICAL_CREAM | Freq: Every day | CUTANEOUS | 0 refills | Status: DC

## 2019-06-20 MED ORDER — CEPHALEXIN 500 MG PO CAPS: 500.0000 mg | ORAL_CAPSULE | Freq: Two times a day (BID) | ORAL | 0 refills | Status: DC
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
## 2019-07-02 ENCOUNTER — Encounter: Payer: Self-pay | Admitting: Physician Assistant

## 2019-07-08 ENCOUNTER — Other Ambulatory Visit: Payer: Self-pay

## 2019-07-08 ENCOUNTER — Ambulatory Visit (INDEPENDENT_AMBULATORY_CARE_PROVIDER_SITE_OTHER): Payer: BC Managed Care – PPO | Admitting: *Deleted

## 2019-07-08 DIAGNOSIS — Z23 Encounter for immunization: Secondary | ICD-10-CM

## 2019-07-08 DIAGNOSIS — I050 Rheumatic mitral stenosis: Secondary | ICD-10-CM

## 2019-07-08 LAB — POCT INR
INR: 3 (ref 2.0–3.0)
PT: 35.5

## 2019-07-08 NOTE — Addendum Note (Signed)
Addended by: Julieta Bellini on: 07/08/2019 11:57 AM   Modules accepted: Orders

## 2019-07-08 NOTE — Patient Instructions (Signed)
Dx: Mitral Valve Stenosis Current dose: 4 mg M W F, except 5mg  qd (T,Thur, Sat, Sun) Changes:No changes Recheck: 4 weeks

## 2019-07-11 ENCOUNTER — Other Ambulatory Visit: Payer: Self-pay | Admitting: Family Medicine

## 2019-08-05 ENCOUNTER — Ambulatory Visit (INDEPENDENT_AMBULATORY_CARE_PROVIDER_SITE_OTHER): Payer: BC Managed Care – PPO

## 2019-08-05 ENCOUNTER — Other Ambulatory Visit: Payer: Self-pay

## 2019-08-05 DIAGNOSIS — I050 Rheumatic mitral stenosis: Secondary | ICD-10-CM | POA: Diagnosis not present

## 2019-08-05 LAB — POCT INR
INR: 2.6 (ref 2.0–3.0)
PT: 31.6

## 2019-08-05 NOTE — Patient Instructions (Signed)
Description   Dx: Mitral Valve Stenosis Current dose: 4 mg M W F, except 5mg qd (T,Thur, Sat, Sun) Changes:No changes Recheck: 4 weeks    

## 2019-09-02 ENCOUNTER — Ambulatory Visit: Payer: Self-pay

## 2019-09-02 ENCOUNTER — Other Ambulatory Visit: Payer: Self-pay

## 2019-09-03 ENCOUNTER — Ambulatory Visit (INDEPENDENT_AMBULATORY_CARE_PROVIDER_SITE_OTHER): Payer: BC Managed Care – PPO | Admitting: Family Medicine

## 2019-09-03 ENCOUNTER — Other Ambulatory Visit: Payer: Self-pay

## 2019-09-03 ENCOUNTER — Other Ambulatory Visit: Payer: Self-pay | Admitting: *Deleted

## 2019-09-03 DIAGNOSIS — I050 Rheumatic mitral stenosis: Secondary | ICD-10-CM | POA: Diagnosis not present

## 2019-09-03 LAB — POCT INR
INR: 3.1 — AB (ref 2.0–3.0)
PT: 37

## 2019-09-03 MED ORDER — WARFARIN SODIUM 5 MG PO TABS: 5.0000 mg | ORAL_TABLET | Freq: Every day | ORAL | 3 refills | Status: DC

## 2019-09-03 NOTE — Patient Instructions (Signed)
Dx: Mitral Valve Stenosis Current dose: 4 mg M W F, except 5mg  qd (T,Thur, Sat, Sun) Changes: No changes Recheck: 4 weeks

## 2019-09-17 ENCOUNTER — Other Ambulatory Visit: Payer: Self-pay

## 2019-09-17 DIAGNOSIS — Z20822 Contact with and (suspected) exposure to covid-19: Secondary | ICD-10-CM

## 2019-09-19 LAB — NOVEL CORONAVIRUS, NAA: SARS-CoV-2, NAA: NOT DETECTED

## 2019-09-30 ENCOUNTER — Other Ambulatory Visit: Payer: Self-pay

## 2019-09-30 ENCOUNTER — Ambulatory Visit (INDEPENDENT_AMBULATORY_CARE_PROVIDER_SITE_OTHER): Payer: BC Managed Care – PPO

## 2019-09-30 DIAGNOSIS — I050 Rheumatic mitral stenosis: Secondary | ICD-10-CM

## 2019-09-30 LAB — POCT INR
INR: 2.7 (ref 2.0–3.0)
PT: 32.1

## 2019-09-30 NOTE — Patient Instructions (Signed)
Description   Dx: Mitral Valve Stenosis Current dose: 4 mg M W F, except 5mg qd (T,Thur, Sat, Sun) Changes:No changes Recheck: 4 weeks    

## 2019-10-07 ENCOUNTER — Ambulatory Visit: Payer: Self-pay

## 2019-10-13 ENCOUNTER — Other Ambulatory Visit: Payer: Self-pay

## 2019-10-13 DIAGNOSIS — Z20822 Contact with and (suspected) exposure to covid-19: Secondary | ICD-10-CM

## 2019-10-14 LAB — NOVEL CORONAVIRUS, NAA: SARS-CoV-2, NAA: NOT DETECTED

## 2019-10-17 ENCOUNTER — Other Ambulatory Visit: Payer: Self-pay | Admitting: Family Medicine

## 2019-10-17 DIAGNOSIS — B002 Herpesviral gingivostomatitis and pharyngotonsillitis: Secondary | ICD-10-CM

## 2019-10-17 MED ORDER — VALACYCLOVIR HCL 1 G PO TABS: 1000.0000 mg | ORAL_TABLET | Freq: Two times a day (BID) | ORAL | 5 refills | Status: DC
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
## 2019-10-17 NOTE — Telephone Encounter (Signed)
Medication Refill - Medication: valtrex  Has the patient contacted their pharmacy? No. (Agent: If no, request that the patient contact the pharmacy for the refill.) (Agent: If yes, when and what did the pharmacy advise?)  Preferred Pharmacy (with phone number or street name):  [place], [place] - [address]  [place] [address]  Phone: [phone] Fax: [phone]  Not a 24 hour pharmacy; exact hours not known.     Agent: Please be advised that RX refills may take up to 3 business days. We ask that you follow-up with your pharmacy.

## 2019-10-28 ENCOUNTER — Ambulatory Visit (INDEPENDENT_AMBULATORY_CARE_PROVIDER_SITE_OTHER): Payer: BC Managed Care – PPO

## 2019-10-28 ENCOUNTER — Other Ambulatory Visit: Payer: Self-pay

## 2019-10-28 DIAGNOSIS — I050 Rheumatic mitral stenosis: Secondary | ICD-10-CM | POA: Diagnosis not present

## 2019-10-28 LAB — POCT INR
INR: 2.9 (ref 2.0–3.0)
PT: 34.8

## 2019-10-28 NOTE — Patient Instructions (Signed)
Description   Dx: Mitral Valve Stenosis Current dose: 4 mg M W F, except 5mg qd (T,Thur, Sat, Sun) Changes:No changes Recheck: 4 weeks    

## 2019-11-25 ENCOUNTER — Ambulatory Visit: Payer: BC Managed Care – PPO

## 2019-11-25 ENCOUNTER — Other Ambulatory Visit: Payer: Self-pay

## 2019-11-25 DIAGNOSIS — I050 Rheumatic mitral stenosis: Secondary | ICD-10-CM

## 2019-11-25 LAB — POCT INR
INR: 3.3 — AB (ref 2.0–3.0)
PT: 39.4

## 2019-11-25 NOTE — Patient Instructions (Signed)
Description   Dx: Mitral Valve Stenosis Current dose: 4 mg M W F, except 5mg  qd (T,Thur, Sat, Sun) Changes:No changes Recheck: 4 weeks

## 2019-12-03 ENCOUNTER — Other Ambulatory Visit: Payer: Self-pay | Admitting: Family Medicine

## 2019-12-03 DIAGNOSIS — G40909 Epilepsy, unspecified, not intractable, without status epilepticus: Secondary | ICD-10-CM

## 2019-12-22 NOTE — Progress Notes (Signed)
Patient: Victoria Davila Female    DOB: 03-09-1956   64 y.o.   MRN: [id number] Visit Date: 12/23/2019  Today's Provider: Wilhemena Durie, MD   Chief Complaint  Patient presents with  . Medication Refill   Subjective:     HPI   Patient presents today for a medication refill.  Torsemide, potassium (klor-con), dilantin, warfarin, metoprolol, calcium, omega 3. She is doing well.  No seizures.  She is taking all her medications as tried.  He had her Covid vaccine this week.  Daughter is giving them their second grandchild, a 98-month-old grandson and they have a 36-year-old granddaughter, both through their daughter.  Their 2 sons are planning to get married later this year.  Mitral valve stenosis, unspecified etiology From 10/17/2018-seen by Vernie Murders. Stable and on anticoagulant therapy. Protime INR was 2.9 today. Will continue Coumadin 6 mg daily except 5 mg on M,W,F. Recheck level in a month. - POCT INR  Nonrheumatic mitral valve stenosis with insufficiency From 10/17/2018-seen by Vernie Murders. Stable BP and heart rate. No palpitations or chest pains. Continue present medical therapy.    No Known Allergies   Current Outpatient Medications:  .  acetaminophen (TYLENOL) 500 MG tablet, Take 1,000 mg by mouth every 6 (six) hours as needed for mild pain or moderate pain., Disp: , Rfl:  .  aspirin 81 MG chewable tablet, Chew 81 mg by mouth daily., Disp: , Rfl:  .  chlorpheniramine-HYDROcodone (TUSSIONEX PENNKINETIC ER) 10-8 MG/5ML SUER, Take 5 mLs by mouth every 12 (twelve) hours as needed for cough. (Patient not taking: Reported on 06/20/2019), Disp: 140 mL, Rfl: 0 .  DILANTIN 100 MG ER capsule, Take 4 capsules (400 mg total) by mouth daily. Please schedule office visit before any future refills., Disp: 120 capsule, Rfl: 0 .  folic acid (FOLVITE) 1 MG tablet, Take 1 mg by mouth daily., Disp: , Rfl:  .  gabapentin (NEURONTIN) 100 MG capsule, Take 100 mg by mouth 3  (three) times daily., Disp: , Rfl:  .  metoprolol succinate (TOPROL-XL) 25 MG 24 hr tablet, Take 25 mg by mouth daily., Disp: , Rfl:  .  Multiple Vitamin (MULTIVITAMIN WITH MINERALS) TABS tablet, Take 1 tablet by mouth at bedtime., Disp: , Rfl:  .  Omega-3 Fatty Acids (FISH OIL) 1200 MG CAPS, Take 1 capsule by mouth 2 (two) times daily. , Disp: , Rfl:  .  potassium chloride (K-DUR) 10 MEQ tablet, Take 3 tablets (30 mEq total) by mouth 2 (two) times daily., Disp: 180 tablet, Rfl: 1 .  potassium chloride (KLOR-CON) 10 MEQ tablet, Take 30 mEq by mouth 2 (two) times daily., Disp: , Rfl:  .  psyllium (REGULOID) 0.52 G capsule, Take 0.52 g by mouth 2 (two) times daily., Disp: , Rfl:  .  silver sulfADIAZINE (SILVADENE) 1 % cream, Apply 1 application topically daily., Disp: 50 g, Rfl: 0 .  torsemide (DEMADEX) 20 MG tablet, Take 2 tablets (40 mg total) by mouth 2 (two) times daily., Disp: 60 tablet, Rfl: 4 .  valACYclovir (VALTREX) 1000 MG tablet, Take 1 tablet (1,000 mg total) by mouth 2 (two) times daily. For one day., Disp: 60 tablet, Rfl: 5 .  warfarin (COUMADIN) 3 MG tablet, Take 2 tablets (6 mg total) by mouth daily., Disp: 60 tablet, Rfl: 5 .  warfarin (COUMADIN) 4 MG tablet, TAKE 1 TABLET EVERY DAY WITH A 3MG  TABLET, Disp: 30 tablet, Rfl: 5 .  warfarin (COUMADIN) 5 MG  tablet, Take 1 tablet (5 mg total) by mouth daily., Disp: 30 tablet, Rfl: 3 .  warfarin (COUMADIN) 6 MG tablet, TAKE 1 TABLET BY MOUTH DAILY, Disp: 30 tablet, Rfl: 12  Review of Systems  Constitutional: Negative for appetite change, chills, fatigue and fever.  HENT: Negative.   Eyes: Negative.   Respiratory: Negative for chest tightness and shortness of breath.   Cardiovascular: Negative for chest pain and palpitations.  Gastrointestinal: Negative for abdominal pain, nausea and vomiting.  Endocrine: Negative.   Allergic/Immunologic: Negative.   Neurological: Negative for dizziness and weakness.  Hematological: Negative.     Psychiatric/Behavioral: Negative.     Social History   Tobacco Use  . Smoking status: Never Smoker  . Smokeless tobacco: Never Used  Substance Use Topics  . Alcohol use: No    Comment: OCCASIONAL WINE ONLY      Objective:   There were no vitals taken for this visit. There were no vitals filed for this visit.There is no height or weight on file to calculate BMI.   Physical Exam Vitals reviewed.  Constitutional:      General: She is not in acute distress.    Appearance: She is well-developed.  HENT:     Head: Normocephalic and atraumatic.     Right Ear: Hearing, tympanic membrane and ear canal normal.     Left Ear: Hearing, tympanic membrane and ear canal normal.     Nose: Nose normal.     Mouth/Throat:     Mouth: Mucous membranes are moist.     Pharynx: Oropharynx is clear. Uvula midline.     Tonsils: No tonsillar exudate or tonsillar abscesses.  Eyes:     General: Lids are normal. No scleral icterus.       Right eye: No discharge.        Left eye: No discharge.     Conjunctiva/sclera: Conjunctivae normal.  Cardiovascular:     Rate and Rhythm: Normal rate and regular rhythm.     Heart sounds: Normal heart sounds.  Pulmonary:     Effort: Pulmonary effort is normal. No respiratory distress.     Breath sounds: Normal breath sounds.  Musculoskeletal:        General: Normal range of motion.     Cervical back: Normal range of motion and neck supple.     Comments: Chronic lymphedema both lower extremities.  Lymphadenopathy:     Cervical: No cervical adenopathy.  Skin:    Findings: No lesion or rash.  Neurological:     Mental Status: She is alert and oriented to person, place, and time.  Psychiatric:        Mood and Affect: Mood normal.        Speech: Speech normal.        Behavior: Behavior normal.        Thought Content: Thought content normal.        Judgment: Judgment normal.      No results found for any visits on 12/23/19.     Assessment & Plan      1. 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  Check Dilantin level.  No seizures recently. - Dilantin (Phenytoin) level, total - CBC with Differential/Platelet - Comprehensive metabolic panel - TSH - Lipid panel  2. Mitral valve stenosis, unspecified etiology Followed by cardiology. - Dilantin (Phenytoin) level, total - CBC with Differential/Platelet - Comprehensive metabolic panel - TSH - Lipid panel  3. Essential (primary) hypertension  - Dilantin (Phenytoin) level, total -  CBC with Differential/Platelet - Comprehensive metabolic panel - TSH - Lipid panel  4. Avitaminosis D  - Dilantin (Phenytoin) level, total - CBC with Differential/Platelet - Comprehensive metabolic panel - TSH - Lipid panel  5. Hypercholesteremia  - Dilantin (Phenytoin) level, total - CBC with Differential/Platelet - Comprehensive metabolic panel - TSH - Lipid panel  6. Morbid obesity with BMI of 40.0-44.9, adult (HCC) Dietary changes discussed to slowly lose weight. - Dilantin (Phenytoin) level, total - CBC with Differential/Platelet - Comprehensive metabolic panel - TSH - Lipid panel   I,Modene Andy M Sebastain Fishbaugh,acting as a scribe for Megan Mans, MD.,have documented all relevant documentation on the behalf of Megan Mans, MD,as directed by  Megan Mans, MD while in the presence of Megan Mans, MD.    Megan Mans, MD  [facility]

## 2019-12-23 ENCOUNTER — Other Ambulatory Visit: Payer: Self-pay

## 2019-12-23 ENCOUNTER — Encounter: Payer: Self-pay | Admitting: Family Medicine

## 2019-12-23 ENCOUNTER — Ambulatory Visit (INDEPENDENT_AMBULATORY_CARE_PROVIDER_SITE_OTHER): Payer: BC Managed Care – PPO | Admitting: Family Medicine

## 2019-12-23 ENCOUNTER — Ambulatory Visit: Payer: BC Managed Care – PPO

## 2019-12-23 VITALS — BP 127/83 | HR 105 | Temp 96.2°F | Wt 246.6 lb

## 2019-12-23 DIAGNOSIS — I052 Rheumatic mitral stenosis with insufficiency: Secondary | ICD-10-CM | POA: Diagnosis not present

## 2019-12-23 DIAGNOSIS — E559 Vitamin D deficiency, unspecified: Secondary | ICD-10-CM | POA: Diagnosis not present

## 2019-12-23 DIAGNOSIS — I050 Rheumatic mitral stenosis: Secondary | ICD-10-CM

## 2019-12-23 DIAGNOSIS — G40909 Epilepsy, unspecified, not intractable, without status epilepticus: Secondary | ICD-10-CM

## 2019-12-23 DIAGNOSIS — R609 Edema, unspecified: Secondary | ICD-10-CM

## 2019-12-23 DIAGNOSIS — Z6841 Body Mass Index (BMI) 40.0 and over, adult: Secondary | ICD-10-CM

## 2019-12-23 DIAGNOSIS — I10 Essential (primary) hypertension: Secondary | ICD-10-CM | POA: Diagnosis not present

## 2019-12-23 DIAGNOSIS — E7800 Pure hypercholesterolemia, unspecified: Secondary | ICD-10-CM

## 2019-12-23 LAB — POCT INR
INR: 3.8 — AB (ref 2.0–3.0)
PT: 45.4

## 2019-12-23 NOTE — Patient Instructions (Signed)
Change to 4mg  daily except 5mg  M & F.  Recheck in two weeks.

## 2019-12-26 LAB — CBC WITH DIFFERENTIAL/PLATELET
Basophils Absolute: 0 10*3/uL (ref 0.0–0.2)
Basos: 1 %
EOS (ABSOLUTE): 0.1 10*3/uL (ref 0.0–0.4)
Eos: 2 %
Hematocrit: 43.5 % (ref 34.0–46.6)
Hemoglobin: 14.9 g/dL (ref 11.1–15.9)
Immature Grans (Abs): 0 10*3/uL (ref 0.0–0.1)
Immature Granulocytes: 0 %
Lymphocytes Absolute: 1.6 10*3/uL (ref 0.7–3.1)
Lymphs: 32 %
MCH: 31.1 pg (ref 26.6–33.0)
MCHC: 34.3 g/dL (ref 31.5–35.7)
MCV: 91 fL (ref 79–97)
Monocytes Absolute: 0.5 10*3/uL (ref 0.1–0.9)
Monocytes: 9 %
Neutrophils Absolute: 2.9 10*3/uL (ref 1.4–7.0)
Neutrophils: 56 %
Platelets: 179 10*3/uL (ref 150–450)
RBC: 4.79 x10E6/uL (ref 3.77–5.28)
RDW: 14 % (ref 11.7–15.4)
WBC: 5.1 10*3/uL (ref 3.4–10.8)

## 2019-12-26 LAB — COMPREHENSIVE METABOLIC PANEL
ALT: 28 IU/L (ref 0–32)
AST: 33 IU/L (ref 0–40)
Albumin/Globulin Ratio: 1.5 (ref 1.2–2.2)
Albumin: 4.3 g/dL (ref 3.8–4.8)
Alkaline Phosphatase: 170 IU/L — ABNORMAL HIGH (ref 39–117)
BUN/Creatinine Ratio: 18 (ref 12–28)
BUN: 17 mg/dL (ref 8–27)
Bilirubin Total: 0.4 mg/dL (ref 0.0–1.2)
CO2: 25 mmol/L (ref 20–29)
Calcium: 9 mg/dL (ref 8.7–10.3)
Chloride: 101 mmol/L (ref 96–106)
Creatinine, Ser: 0.92 mg/dL (ref 0.57–1.00)
GFR calc Af Amer: 76 mL/min/{1.73_m2} (ref >59)
GFR calc non Af Amer: 66 mL/min/{1.73_m2} (ref >59)
Globulin, Total: 2.8 g/dL (ref 1.5–4.5)
Glucose: 92 mg/dL (ref 65–99)
Potassium: 4.3 mmol/L (ref 3.5–5.2)
Sodium: 142 mmol/L (ref 134–144)
Total Protein: 7.1 g/dL (ref 6.0–8.5)

## 2019-12-26 LAB — LIPID PANEL
Chol/HDL Ratio: 3.4 ratio (ref 0.0–4.4)
Cholesterol, Total: 267 mg/dL — ABNORMAL HIGH (ref 100–199)
HDL: 78 mg/dL (ref >39)
LDL Chol Calc (NIH): 169 mg/dL — ABNORMAL HIGH (ref 0–99)
Triglycerides: 116 mg/dL (ref 0–149)
VLDL Cholesterol Cal: 20 mg/dL (ref 5–40)

## 2019-12-26 LAB — PHENYTOIN LEVEL, TOTAL: Phenytoin (Dilantin), Serum: 24.6 ug/mL (ref 10.0–20.0)

## 2019-12-26 LAB — TSH: TSH: 2.47 u[IU]/mL (ref 0.450–4.500)

## 2019-12-29 ENCOUNTER — Telehealth: Payer: Self-pay

## 2019-12-29 NOTE — Telephone Encounter (Signed)
Patient advised, she says that she does not currently have a neurologist. Please advise?

## 2019-12-29 NOTE — Telephone Encounter (Signed)
Patient advised and scheduled for follow up appointment.  

## 2019-12-29 NOTE — Telephone Encounter (Signed)
-----   Message from Maple Hudson., MD sent at 12/29/2019 12:49 PM EST ----- Phenytoin level is too high.  Have patient cut her dose in half until she gets in touch with neurologist who manages her seizures.  They can then adjust the dose to the appropriate level.

## 2019-12-29 NOTE — Telephone Encounter (Signed)
For phenytoin if patient is taking 4 tablets daily then cut back to 3 and see me back in about a month.

## 2020-01-06 ENCOUNTER — Other Ambulatory Visit: Payer: Self-pay

## 2020-01-06 ENCOUNTER — Other Ambulatory Visit: Payer: Self-pay | Admitting: Family Medicine

## 2020-01-06 ENCOUNTER — Ambulatory Visit: Payer: BC Managed Care – PPO

## 2020-01-06 DIAGNOSIS — I050 Rheumatic mitral stenosis: Secondary | ICD-10-CM

## 2020-01-06 DIAGNOSIS — G40909 Epilepsy, unspecified, not intractable, without status epilepticus: Secondary | ICD-10-CM

## 2020-01-06 LAB — POCT INR
INR: 2.3 (ref 2.0–3.0)
PT: 27.8

## 2020-01-06 NOTE — Telephone Encounter (Signed)
Patient requesting refills on the following medication: DILANTIN 100 MG ER capsule  Per patient she was taking 4 capsules by mouth daily but was advised by Fr. G to do 3 capsules daily.  Pharmacy: [facility]

## 2020-01-06 NOTE — Patient Instructions (Signed)
Description   Dx: Mitral Valve Stenosis Change to 4mg  daily except 5mg  M, W & F.  Recheck in two weeks.

## 2020-01-06 NOTE — Telephone Encounter (Signed)
Requested medication (s) are due for refill today: Yes  Requested medication (s) are on the active medication list: Yes  Last refill:  12/03/19  Future visit scheduled: Yes  Notes to clinic:  See request.    Requested Prescriptions  Pending Prescriptions Disp Refills   DILANTIN 100 MG ER capsule [Pharmacy Med Name: DILANTIN 100 MG CAP] 120 capsule 0    Sig: [place]      Not Delegated - Neurology:  Anticonvulsants - phenytoin Failed - 01/06/2020  8:21 AM      Failed - This refill cannot be delegated      Failed - Phenytoin (serum) in normal range and within 360 days    Phenytoin (Dilantin), Serum  Date Value Ref Range Status  12/25/2019 24.6 (HH) 10.0 - 20.0 ug/mL Final    Comment:                                    Detection Limit =  0.8                           <0.8 Indicates None Detected Patient drug level exceeds published reference range.  Evaluate clinically for signs of potential toxicity.   03/01/2016 11.7 10.0 - 20.0 ug/mL Final    Comment:                                    Neonatal:                                 Therapeutic 6.0 - 14.0                                 Detection Limit =  0.8                           <0.8 Indicates None Detected           Passed - ALT in normal range and within 360 days    ALT  Date Value Ref Range Status  12/25/2019 28 0 - 32 IU/L Final          Passed - AST in normal range and within 360 days    AST  Date Value Ref Range Status  12/25/2019 33 0 - 40 IU/L Final          Passed - HGB in normal range and within 360 days    Hemoglobin  Date Value Ref Range Status  12/25/2019 14.9 11.1 - 15.9 g/dL Final          Passed - HCT in normal range and within 360 days    Hematocrit  Date Value Ref Range Status  12/25/2019 43.5 34.0 - 46.6 % Final          Passed - PLT in normal range and within 360 days    Platelets  Date Value Ref Range Status  12/25/2019 179 150 - 450 x10E3/uL Final          Passed - WBC in normal range and within 360 days    WBC  Date Value Ref Range Status  12/25/2019 5.1 3.4 -  10.8 x10E3/uL Final  10/05/2017 6.2 3.8 - 10.8 Thousand/uL Final          Passed - Valid encounter within last 12 months    Recent Outpatient Visits           2 weeks ago Cerebral seizure [facility])   [facility] Maple Hudson., MD   6 months ago Superficial burn of right thigh, initial encounter   [facility] [place], [place], [place]   1 year ago URI with cough and congestion   [facility] [place], [place], [place]   1 year ago Fever, unspecified fever cause   [facility] Malva Limes, MD   1 year ago Shortness of breath   [facility] Maple Hudson., MD       Future Appointments             In 3 weeks Maple Hudson., MD [facility]   In 5 months Maple Hudson., MD [facility], [facility]

## 2020-01-20 ENCOUNTER — Other Ambulatory Visit: Payer: Self-pay

## 2020-01-20 ENCOUNTER — Ambulatory Visit: Payer: BC Managed Care – PPO

## 2020-01-20 DIAGNOSIS — I050 Rheumatic mitral stenosis: Secondary | ICD-10-CM | POA: Diagnosis not present

## 2020-01-20 LAB — POCT INR
INR: 2.3 (ref 2.0–3.0)
PT: 27.4

## 2020-01-20 NOTE — Patient Instructions (Signed)
Description   Dx: Mitral Valve Stenosis Change to 5mg  daily except 4mg  M, W & F.  Recheck in two weeks.

## 2020-01-23 NOTE — Progress Notes (Signed)
Patient: Victoria Davila Female    DOB: 11/27/1955   64 y.o.   MRN: [id number] Visit Date: 01/27/2020  Today's Provider: Wilhemena Durie, MD   Chief Complaint  Patient presents with  . Phenytoin Level   Subjective:     HPI   Patient presents today for a f/u on phenytoin level.  Victoria Davila feels well and has no complaints.  Victoria Davila has had no seizures.  Cerebral seizure ([place]) From 12/23/2019-Followed by neurology.  Check Dilantin level.  No seizures recently. Labs checked showing-Phenytoin level is too high. Have patient cut her dose in half until Victoria Davila gets in touch with neurologist who manages her seizures. They can then adjust the dose to the appropriate level.  No Known Allergies   Current Outpatient Medications:  .  acetaminophen (TYLENOL) 500 MG tablet, Take 1,000 mg by mouth every 6 (six) hours as needed for mild pain or moderate pain., Disp: , Rfl:  .  aspirin 81 MG chewable tablet, Chew 81 mg by mouth daily., Disp: , Rfl:  .  chlorpheniramine-HYDROcodone (TUSSIONEX PENNKINETIC ER) 10-8 MG/5ML SUER, Take 5 mLs by mouth every 12 (twelve) hours as needed for cough. (Patient not taking: Reported on 06/20/2019), Disp: 140 mL, Rfl: 0 .  folic acid (FOLVITE) 1 MG tablet, Take 1 mg by mouth daily., Disp: , Rfl:  .  gabapentin (NEURONTIN) 100 MG capsule, Take 100 mg by mouth 3 (three) times daily., Disp: , Rfl:  .  metoprolol succinate (TOPROL-XL) 25 MG 24 hr tablet, Take 25 mg by mouth daily., Disp: , Rfl:  .  Multiple Vitamin (MULTIVITAMIN WITH MINERALS) TABS tablet, Take 1 tablet by mouth at bedtime., Disp: , Rfl:  .  Omega-3 Fatty Acids (FISH OIL) 1200 MG CAPS, Take 1 capsule by mouth 2 (two) times daily. , Disp: , Rfl:  .  phenytoin (DILANTIN) 100 MG ER capsule, TAKE THREE CAPSULES DAILY, Disp: 90 capsule, Rfl: 0 .  potassium chloride (K-DUR) 10 MEQ tablet, Take 3 tablets (30 mEq total) by mouth 2 (two) times daily., Disp: 180 tablet, Rfl: 1 .  potassium chloride (KLOR-CON)  10 MEQ tablet, Take 30 mEq by mouth 2 (two) times daily., Disp: , Rfl:  .  psyllium (REGULOID) 0.52 G capsule, Take 0.52 g by mouth 2 (two) times daily., Disp: , Rfl:  .  silver sulfADIAZINE (SILVADENE) 1 % cream, Apply 1 application topically daily. (Patient not taking: Reported on 12/23/2019), Disp: 50 g, Rfl: 0 .  torsemide (DEMADEX) 20 MG tablet, Take 2 tablets (40 mg total) by mouth 2 (two) times daily., Disp: 60 tablet, Rfl: 4 .  valACYclovir (VALTREX) 1000 MG tablet, Take 1 tablet (1,000 mg total) by mouth 2 (two) times daily. For one day., Disp: 60 tablet, Rfl: 5 .  warfarin (COUMADIN) 3 MG tablet, Take 2 tablets (6 mg total) by mouth daily., Disp: 60 tablet, Rfl: 5 .  warfarin (COUMADIN) 4 MG tablet, TAKE 1 TABLET EVERY DAY WITH A 3MG  TABLET, Disp: 30 tablet, Rfl: 5 .  warfarin (COUMADIN) 5 MG tablet, Take 1 tablet (5 mg total) by mouth daily., Disp: 30 tablet, Rfl: 3 .  warfarin (COUMADIN) 6 MG tablet, TAKE 1 TABLET BY MOUTH DAILY, Disp: 30 tablet, Rfl: 12  Review of Systems  Constitutional: Negative for appetite change, chills, fatigue and fever.  HENT: Negative.   Eyes: Negative.   Respiratory: Negative for chest tightness and shortness of breath.   Cardiovascular: Negative for chest pain and palpitations.  Gastrointestinal: Negative for abdominal pain, nausea and vomiting.  Musculoskeletal: Positive for arthralgias.  Allergic/Immunologic: Negative.   Neurological: Negative for dizziness and weakness.  Hematological: Negative.   Psychiatric/Behavioral: Negative.     Social History   Tobacco Use  . Smoking status: Never Smoker  . Smokeless tobacco: Never Used  Substance Use Topics  . Alcohol use: No    Comment: OCCASIONAL WINE ONLY      Objective:   There were no vitals taken for this visit. There were no vitals filed for this visit.There is no height or weight on file to calculate BMI.   Physical Exam Vitals reviewed.  Constitutional:      General: Victoria Davila is not in  acute distress.    Appearance: Victoria Davila is well-developed. Victoria Davila is obese.  HENT:     Head: Normocephalic and atraumatic.     Right Ear: Hearing, tympanic membrane and ear canal normal.     Left Ear: Hearing, tympanic membrane and ear canal normal.     Nose: Nose normal.     Mouth/Throat:     Mouth: Mucous membranes are moist.     Pharynx: Oropharynx is clear. Uvula midline.     Tonsils: No tonsillar exudate or tonsillar abscesses.  Eyes:     General: Lids are normal. No scleral icterus.       Right eye: No discharge.        Left eye: No discharge.     Conjunctiva/sclera: Conjunctivae normal.  Cardiovascular:     Rate and Rhythm: Normal rate and regular rhythm.     Heart sounds: Normal heart sounds.  Pulmonary:     Effort: Pulmonary effort is normal. No respiratory distress.     Breath sounds: Normal breath sounds.  Musculoskeletal:     Cervical back: Normal range of motion and neck supple.     Comments: Chronic lymphedema both lower extremities.  Lymphadenopathy:     Cervical: No cervical adenopathy.  Skin:    General: Skin is warm and dry.     Findings: No lesion or rash.  Neurological:     General: No focal deficit present.     Mental Status: Victoria Davila is alert and oriented to person, place, and time.  Psychiatric:        Mood and Affect: Mood normal.        Speech: Speech normal.        Behavior: Behavior normal.        Thought Content: Thought content normal.        Judgment: Judgment normal.      No results found for any visits on 01/27/20.     Assessment & Plan      1. Cerebral seizure (HCC) Dilantin level was high last visit.  Would like to get it around 15.  May need to follow-up on next visit also. - Dilantin (Phenytoin) level, total  2. Atrial fibrillation with rapid ventricular response (HCC) On chronic Coumadin  3. Essential (primary) hypertension   4. Arthritis   5. Morbid obesity with BMI of 40.0-44.9, adult (HCC) Diet and exercise discussed.  6.  Hypercholesteremia   7. Lymphedema Chronic problem.   Ermine Stebbins Wendelyn Breslow, MD  [facility]

## 2020-01-27 ENCOUNTER — Encounter: Payer: Self-pay | Admitting: Family Medicine

## 2020-01-27 ENCOUNTER — Other Ambulatory Visit: Payer: Self-pay

## 2020-01-27 ENCOUNTER — Ambulatory Visit: Payer: BC Managed Care – PPO | Admitting: Family Medicine

## 2020-01-27 VITALS — BP 131/83 | HR 108 | Temp 96.6°F | Ht 63.0 in | Wt 248.2 lb

## 2020-01-27 DIAGNOSIS — G40909 Epilepsy, unspecified, not intractable, without status epilepticus: Secondary | ICD-10-CM | POA: Diagnosis not present

## 2020-01-27 DIAGNOSIS — M1990 Unspecified osteoarthritis, unspecified site: Secondary | ICD-10-CM

## 2020-01-27 DIAGNOSIS — I10 Essential (primary) hypertension: Secondary | ICD-10-CM

## 2020-01-27 DIAGNOSIS — E7800 Pure hypercholesterolemia, unspecified: Secondary | ICD-10-CM

## 2020-01-27 DIAGNOSIS — Z6841 Body Mass Index (BMI) 40.0 and over, adult: Secondary | ICD-10-CM

## 2020-01-27 DIAGNOSIS — I4891 Unspecified atrial fibrillation: Secondary | ICD-10-CM

## 2020-01-27 DIAGNOSIS — I890 Lymphedema, not elsewhere classified: Secondary | ICD-10-CM

## 2020-01-27 NOTE — Patient Instructions (Signed)
TRY OTC VOLTAREN GEL FOR HANDS!!!

## 2020-01-28 ENCOUNTER — Telehealth: Payer: Self-pay | Admitting: Family Medicine

## 2020-01-28 ENCOUNTER — Telehealth: Payer: Self-pay

## 2020-01-28 LAB — PHENYTOIN LEVEL, TOTAL: Phenytoin (Dilantin), Serum: 10.1 ug/mL (ref 10.0–20.0)

## 2020-01-28 NOTE — Telephone Encounter (Signed)
Message has already been resolved.  

## 2020-01-28 NOTE — Telephone Encounter (Signed)
Patient and her husband are concerned because her dilantin level had dropped so fast from when she was here a few weeks ago and would like to know will it stay at the range of 10.1 or will it drop even further because her medication dosage was cut back. Dr. Sullivan Lone is out of the office please advise?

## 2020-01-28 NOTE — Telephone Encounter (Signed)
It only takes a few days for dilantin levels to level off after the dose is increased. It should not drop any further if she takes it consistently.

## 2020-01-28 NOTE — Telephone Encounter (Signed)
-----   Message from Maple Hudson., MD sent at 01/28/2020  8:32 AM EDT ----- Phenytoin normal range.  We will check it again on her next visit.

## 2020-01-28 NOTE — Telephone Encounter (Signed)
Copied from CRM [id number]. Topic: General - Other >> Jan 28, 2020 10:02 AM Herby Abraham C wrote: Reason for CRM: pt & spouse called in to discuss results. They have viewed them on mychart and still have some concerns.    Please assist.

## 2020-01-28 NOTE — Telephone Encounter (Signed)
Patient advised.

## 2020-01-29 ENCOUNTER — Ambulatory Visit: Payer: Self-pay | Admitting: Family Medicine

## 2020-02-03 ENCOUNTER — Other Ambulatory Visit: Payer: Self-pay

## 2020-02-03 ENCOUNTER — Ambulatory Visit: Payer: BC Managed Care – PPO

## 2020-02-03 DIAGNOSIS — I052 Rheumatic mitral stenosis with insufficiency: Secondary | ICD-10-CM

## 2020-02-03 DIAGNOSIS — I050 Rheumatic mitral stenosis: Secondary | ICD-10-CM

## 2020-02-03 LAB — POCT INR
INR: 2.3 (ref 2.0–3.0)
PT: 27

## 2020-02-03 NOTE — Patient Instructions (Signed)
Description   Dx: Mitral Valve Stenosis Change to 5mg  daily except 4mg  M, F.  Recheck in two weeks.

## 2020-02-05 ENCOUNTER — Other Ambulatory Visit: Payer: Self-pay | Admitting: Family Medicine

## 2020-02-05 DIAGNOSIS — G40909 Epilepsy, unspecified, not intractable, without status epilepticus: Secondary | ICD-10-CM

## 2020-02-05 NOTE — Telephone Encounter (Signed)
Requested medication (s) are due for refill today: yes  Requested medication (s) are on the active medication list: yes  Last refill:  01/06/20  Future visit scheduled: yes  Notes to clinic:  not delegated   Requested Prescriptions  Pending Prescriptions Disp Refills   DILANTIN 100 MG ER capsule [Pharmacy Med Name: DILANTIN 100 MG CAP] 90 capsule 0    Sig: [place]      Not Delegated - Neurology:  Anticonvulsants - phenytoin Failed - 02/05/2020  9:29 AM      Failed - This refill cannot be delegated      Passed - ALT in normal range and within 360 days    ALT  Date Value Ref Range Status  12/25/2019 28 0 - 32 IU/L Final          Passed - AST in normal range and within 360 days    AST  Date Value Ref Range Status  12/25/2019 33 0 - 40 IU/L Final          Passed - HGB in normal range and within 360 days    Hemoglobin  Date Value Ref Range Status  12/25/2019 14.9 11.1 - 15.9 g/dL Final          Passed - HCT in normal range and within 360 days    Hematocrit  Date Value Ref Range Status  12/25/2019 43.5 34.0 - 46.6 % Final          Passed - PLT in normal range and within 360 days    Platelets  Date Value Ref Range Status  12/25/2019 179 150 - 450 x10E3/uL Final          Passed - WBC in normal range and within 360 days    WBC  Date Value Ref Range Status  12/25/2019 5.1 3.4 - 10.8 x10E3/uL Final  10/05/2017 6.2 3.8 - 10.8 Thousand/uL Final          Passed - Phenytoin (serum) in normal range and within 360 days    Phenytoin (Dilantin), Serum  Date Value Ref Range Status  01/27/2020 10.1 10.0 - 20.0 ug/mL Final    Comment:                                    Detection Limit =  0.8                           <0.8 Indicates None Detected   03/01/2016 11.7 10.0 - 20.0 ug/mL Final    Comment:                                    Neonatal:                                 Therapeutic 6.0 - 14.0                                 Detection Limit =   0.8                           <0.8 Indicates None Detected  Passed - Valid encounter within last 12 months    Recent Outpatient Visits           1 week ago Cerebral seizure [facility])   [facility] Maple Hudson., MD   1 month ago Cerebral seizure [facility])   [facility] Maple Hudson., MD   7 months ago Superficial burn of right thigh, initial encounter   [facility] [place], [place], [place]   1 year ago URI with cough and congestion   [facility] [place], [place], [place]   1 year ago Fever, unspecified fever cause   [facility] Malva Limes, MD       Future Appointments             In 2 months Maple Hudson., MD [facility]   In 4 months Maple Hudson., MD [facility], [facility]

## 2020-02-17 ENCOUNTER — Ambulatory Visit: Payer: BC Managed Care – PPO

## 2020-02-17 ENCOUNTER — Other Ambulatory Visit: Payer: Self-pay

## 2020-02-17 DIAGNOSIS — I050 Rheumatic mitral stenosis: Secondary | ICD-10-CM | POA: Diagnosis not present

## 2020-02-17 LAB — POCT INR
INR: 2.5 (ref 2.0–3.0)
PT: 29.5

## 2020-02-17 NOTE — Patient Instructions (Signed)
Description   No changes continue 5mg  daily except 4mg  M, F.  Recheck in two weeks.

## 2020-03-02 ENCOUNTER — Other Ambulatory Visit: Payer: Self-pay

## 2020-03-02 ENCOUNTER — Ambulatory Visit: Payer: BC Managed Care – PPO

## 2020-03-02 DIAGNOSIS — I050 Rheumatic mitral stenosis: Secondary | ICD-10-CM | POA: Diagnosis not present

## 2020-03-02 LAB — POCT INR
INR: 3.4 — AB (ref 2.0–3.0)
PT: 40.6

## 2020-03-02 MED ORDER — WARFARIN SODIUM 5 MG PO TABS: 5.0000 mg | ORAL_TABLET | Freq: Every day | ORAL | 3 refills | Status: DC

## 2020-03-02 NOTE — Patient Instructions (Signed)
Description   No changes continue 5mg  daily except 4mg  M, F.  Recheck in 3 weeks.

## 2020-03-02 NOTE — Telephone Encounter (Signed)
Patient requested refills on Warfarin 5 mg while on PT Clinic. This has been sent for her to [facility]

## 2020-03-23 ENCOUNTER — Telehealth: Payer: Self-pay

## 2020-03-23 ENCOUNTER — Ambulatory Visit (INDEPENDENT_AMBULATORY_CARE_PROVIDER_SITE_OTHER): Payer: BC Managed Care – PPO

## 2020-03-23 ENCOUNTER — Other Ambulatory Visit: Payer: Self-pay

## 2020-03-23 DIAGNOSIS — I050 Rheumatic mitral stenosis: Secondary | ICD-10-CM | POA: Diagnosis not present

## 2020-03-23 LAB — POCT INR
INR: 2.7 (ref 2.0–3.0)
PT: 32.8

## 2020-03-23 MED ORDER — WARFARIN SODIUM 5 MG PO TABS: 5.0000 mg | ORAL_TABLET | Freq: Every day | ORAL | 3 refills | Status: DC

## 2020-03-23 NOTE — Telephone Encounter (Signed)
Refill sent to pharmacy.   

## 2020-03-23 NOTE — Telephone Encounter (Signed)
Patient requesting Refill on the following medication warfarin (COUMADIN) 5 MG tablet Pharmacy:[facility].

## 2020-03-23 NOTE — Patient Instructions (Signed)
Description   No changes continue 5mg  daily except 4mg  M, F.  Recheck in 4 weeks.

## 2020-04-09 NOTE — Progress Notes (Signed)
Established patient visit   Patient: Victoria Davila   DOB: Dec 17, 1955   64 y.o. Female  MRN: [id number] Visit Date: 04/14/2020  Today's healthcare provider: Wilhemena Durie, MD   No chief complaint on file.  Subjective    HPI   Hypertension, follow-up  BP Readings from Last 3 Encounters:  04/14/20 122/66  01/27/20 131/83  12/23/19 127/83   Wt Readings from Last 3 Encounters:  04/14/20 250 lb (113.4 kg)  01/27/20 248 lb 3.2 oz (112.6 kg)  12/23/19 246 lb 9.6 oz (111.9 kg)     She was last seen for hypertension 3 months ago.  BP at that visit was 131/83. Management since that visit includes no changes.  She reports excellent compliance with treatment. She is not having side effects.  She is following a Regular diet. She is exercising. She does not smoke.  Use of agents associated with hypertension: none.   Outside blood pressures are not being checked.  Lipid/Cholesterol, Follow-up  Last lipid panel Other pertinent labs  Lab Results  Component Value Date   CHOL 267 (H) 12/25/2019   HDL 78 12/25/2019   LDLCALC 169 (H) 12/25/2019   TRIG 116 12/25/2019   CHOLHDL 3.4 12/25/2019   Lab Results  Component Value Date   ALT 28 12/25/2019   AST 33 12/25/2019   PLT 179 12/25/2019   TSH 2.470 12/25/2019     She was last seen for this 3 months ago.  Management since that visit includes no changes.  She reports excellent compliance with treatment. She is not having side effects.     The 10-year ASCVD risk score Mikey Bussing DC Brooke Bonito., et al., 2013) is: 6.1%  ---------------------------------------------------------------------------------------------------   --------------------------------------------------------------------------------------------------- Patient Active Problem List   Diagnosis Date Noted  . Delayed surgical wound healing 01/03/2018  . 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 Cervical spine arthritis 11/09/2017  . H/O maze  procedure 11/09/2017  . H/O tricuspid valve repair 11/09/2017  . H/O mitral valve replacement with mechanical valve 11/09/2017  . History of Coumadin therapy 11/06/2017  . Chronic diastolic heart failure, NYHA class 2 ([place]) 08/30/2017  . Chronic bilateral low back pain 10/13/2016  . Recurrent oral herpes simplex 04/30/2015  . Arthritis 04/15/2015  . Gonalgia 04/15/2015  . Cervical polyp 04/15/2015  . Encounter for screening for diabetes mellitus 04/15/2015  . Edema 04/15/2015  . Abnormal liver enzymes 04/15/2015  . Essential (primary) hypertension 04/15/2015  . H/O head injury 04/15/2015  . Hypercholesteremia 04/15/2015  . Mitral regurgitation and mitral stenosis 04/15/2015  . Morbid obesity with BMI of 40.0-44.9, adult ([place]) 04/15/2015  . AF (paroxysmal atrial fibrillation) ([place]) 04/15/2015  . RBBB (right bundle branch block) 04/15/2015  . Cerebral seizure ([place]) 04/15/2015  . 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 Urge incontinence 04/15/2015  . Avitaminosis D 04/15/2015  . Arthritis of right hip 11/06/2014  . 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 Breath shortness 07/28/2014   Social History   Tobacco Use  . Smoking status: Never Smoker  . Smokeless tobacco: Never Used  Vaping Use  . Vaping Use: Never used  Substance Use Topics  . Alcohol use: No    Comment: OCCASIONAL WINE ONLY  . Drug use: No   No Known Allergies   Medications: Outpatient Medications Prior to Visit  Medication Sig  . acetaminophen (TYLENOL) 500 MG tablet Take 1,000 mg by mouth every 6 (six) hours as needed for mild pain or moderate pain.  Marland Kitchen  aspirin 81 MG chewable tablet Chew 81 mg by mouth daily.  Marland Kitchen DILANTIN 100 MG ER capsule TAKE THREE CAPSULES DAILY  . metoprolol succinate (TOPROL-XL) 25 MG 24 hr tablet Take 25 mg by mouth daily.  . Multiple Vitamin (MULTIVITAMIN WITH MINERALS) TABS tablet Take 1 tablet by mouth at bedtime.  . Omega-3 Fatty Acids (FISH OIL) 1200 MG CAPS Take 1  capsule by mouth 2 (two) times daily.   . potassium chloride (KLOR-CON) 10 MEQ tablet Take 30 mEq by mouth 2 (two) times daily.  . psyllium (REGULOID) 0.52 G capsule Take 0.52 g by mouth 2 (two) times daily.  Marland Kitchen torsemide (DEMADEX) 20 MG tablet Take 2 tablets (40 mg total) by mouth 2 (two) times daily.  . valACYclovir (VALTREX) 1000 MG tablet Take 1 tablet (1,000 mg total) by mouth 2 (two) times daily. For one day.  . warfarin (COUMADIN) 5 MG tablet Take 1 tablet (5 mg total) by mouth daily.  . chlorpheniramine-HYDROcodone (TUSSIONEX PENNKINETIC ER) 10-8 MG/5ML SUER Take 5 mLs by mouth every 12 (twelve) hours as needed for cough. (Patient not taking: Reported on 06/20/2019)  . folic acid (FOLVITE) 1 MG tablet Take 1 mg by mouth daily.  Marland Kitchen gabapentin (NEURONTIN) 100 MG capsule Take 100 mg by mouth 3 (three) times daily.  . potassium chloride (K-DUR) 10 MEQ tablet Take 3 tablets (30 mEq total) by mouth 2 (two) times daily.  . silver sulfADIAZINE (SILVADENE) 1 % cream Apply 1 application topically daily. (Patient not taking: Reported on 12/23/2019)  . warfarin (COUMADIN) 3 MG tablet Take 2 tablets (6 mg total) by mouth daily.  Marland Kitchen warfarin (COUMADIN) 4 MG tablet TAKE 1 TABLET EVERY DAY WITH A 3MG  TABLET  . warfarin (COUMADIN) 6 MG tablet TAKE 1 TABLET BY MOUTH DAILY   No facility-administered medications prior to visit.    Review of Systems  Constitutional: Negative for appetite change, chills, fatigue and fever.  HENT: Negative.   Eyes: Negative.   Respiratory: Negative for chest tightness and shortness of breath.   Cardiovascular: Negative for chest pain and palpitations.  Gastrointestinal: Negative for abdominal pain, nausea and vomiting.  Endocrine: Negative.   Allergic/Immunologic: Negative.   Neurological: Negative for dizziness and weakness.  Hematological: Negative.   Psychiatric/Behavioral: Negative.       Objective    BP 122/66 (BP Location: Right Arm, Patient Position: Sitting,  Cuff Size: Large)   Pulse (!) 108   Temp (!) 97.5 F (36.4 C) (Temporal)   Wt 250 lb (113.4 kg)   BMI 44.29 kg/m  BP Readings from Last 3 Encounters:  04/14/20 122/66  01/27/20 131/83  12/23/19 127/83      Physical Exam Vitals reviewed.  Constitutional:      General: She is not in acute distress.    Appearance: She is well-developed. She is obese.  HENT:     Head: Normocephalic and atraumatic.     Right Ear: Hearing, tympanic membrane and ear canal normal.     Left Ear: Hearing, tympanic membrane and ear canal normal.     Nose: Nose normal.     Mouth/Throat:     Mouth: Mucous membranes are moist.     Pharynx: Oropharynx is clear. Uvula midline.     Tonsils: No tonsillar exudate or tonsillar abscesses.  Eyes:     General: Lids are normal. No scleral icterus.       Right eye: No discharge.        Left eye: No discharge.  Conjunctiva/sclera: Conjunctivae normal.  Cardiovascular:     Rate and Rhythm: Normal rate and regular rhythm.     Heart sounds: Normal heart sounds.  Pulmonary:     Effort: Pulmonary effort is normal. No respiratory distress.     Breath sounds: Normal breath sounds.  Musculoskeletal:     Cervical back: Normal range of motion and neck supple.     Comments: Chronic lymphedema both lower extremities.  Lymphadenopathy:     Cervical: No cervical adenopathy.  Skin:    General: Skin is warm and dry.     Findings: No lesion or rash.  Neurological:     General: No focal deficit present.     Mental Status: She is alert and oriented to person, place, and time.  Psychiatric:        Mood and Affect: Mood normal.        Speech: Speech normal.        Behavior: Behavior normal.        Thought Content: Thought content normal.        Judgment: Judgment normal.       Results for orders placed or performed in visit on 04/14/20  POCT INR  Result Value Ref Range   INR 2.7 2.0 - 3.0   PT 32.2     Assessment & Plan     1. Cerebral seizure (HCC) Adjust  Dilantin dosing according to level - Dilantin (Phenytoin) level, total  2. Essential (primary) hypertension Excellent control.  3. Hypercholesteremia   4. Morbid obesity with BMI of 40.0-44.9, adult (HCC) Any activity and some weight loss would help some .  5. Mitral valve stenosis, unspecified etiology  - POCT INR  6. Afib INR of 2.7.  Same dose of Coumadin. 7.Lymphedema Return in about 4 weeks (around 05/12/2020).      I, Megan Mans, MD, have reviewed all documentation for this visit. The documentation on 04/18/20 for the exam, diagnosis, procedures, and orders are all accurate and complete.    Currie Dennin Wendelyn Breslow, MD  [facility] [phone] (phone) [phone] (fax)  [facility]

## 2020-04-14 ENCOUNTER — Encounter: Payer: Self-pay | Admitting: Family Medicine

## 2020-04-14 ENCOUNTER — Ambulatory Visit: Payer: BC Managed Care – PPO | Admitting: Family Medicine

## 2020-04-14 ENCOUNTER — Other Ambulatory Visit: Payer: Self-pay

## 2020-04-14 VITALS — BP 122/66 | HR 108 | Temp 97.5°F | Wt 250.0 lb

## 2020-04-14 DIAGNOSIS — G40909 Epilepsy, unspecified, not intractable, without status epilepticus: Secondary | ICD-10-CM | POA: Diagnosis not present

## 2020-04-14 DIAGNOSIS — I10 Essential (primary) hypertension: Secondary | ICD-10-CM

## 2020-04-14 DIAGNOSIS — I052 Rheumatic mitral stenosis with insufficiency: Secondary | ICD-10-CM

## 2020-04-14 DIAGNOSIS — I050 Rheumatic mitral stenosis: Secondary | ICD-10-CM

## 2020-04-14 DIAGNOSIS — E7800 Pure hypercholesterolemia, unspecified: Secondary | ICD-10-CM

## 2020-04-14 DIAGNOSIS — Z6841 Body Mass Index (BMI) 40.0 and over, adult: Secondary | ICD-10-CM

## 2020-04-14 LAB — POCT INR
INR: 2.7 (ref 2.0–3.0)
PT: 32.2

## 2020-04-14 NOTE — Patient Instructions (Signed)
Continue 5mg  daily except 4mg  Mondays and Fridays.  Recheck in four weeks.

## 2020-04-15 ENCOUNTER — Telehealth: Payer: Self-pay

## 2020-04-15 DIAGNOSIS — G40909 Epilepsy, unspecified, not intractable, without status epilepticus: Secondary | ICD-10-CM

## 2020-04-15 LAB — PHENYTOIN LEVEL, TOTAL: Phenytoin (Dilantin), Serum: 8.1 ug/mL — ABNORMAL LOW (ref 10.0–20.0)

## 2020-04-15 MED ORDER — DILANTIN 100 MG PO CAPS: ORAL_CAPSULE | ORAL | 1 refills | Status: DC

## 2020-04-15 NOTE — Addendum Note (Signed)
Addended by: Aleene Davidson on: 04/15/2020 10:13 AM   Modules accepted: Orders

## 2020-04-15 NOTE — Telephone Encounter (Signed)
Attempted to contact patient, no answer left a voicemail. Okay for [facility] triage to advise patient. ° °

## 2020-04-15 NOTE — Telephone Encounter (Addendum)
Pt given results per Dr Sullivan Lone, "Dilantin level slightly low.  Increase from 300 to 400 mg daily.  Follow-up in about 2 months"; she verbalized understanding.; the pt states she needs a script for her Dilantin 400 mg daily; she uses [facility] [place];decision tree completed; pt offered and accepted appt on 06/15/20 at 0920; she again verbalized understanding; will route to office for final disposition.

## 2020-04-15 NOTE — Telephone Encounter (Signed)
-----   Message from Maple Hudson., MD sent at 04/15/2020  8:04 AM EDT ----- Dilantin level slightly low.  Increase from 300 to 400 mg daily.  Follow-up in about 2 months.

## 2020-04-15 NOTE — Telephone Encounter (Signed)
Refill sent to pharmacy.   

## 2020-04-15 NOTE — Telephone Encounter (Signed)
Please advise 

## 2020-04-15 NOTE — Addendum Note (Signed)
Addended by: Aleene Davidson on: 04/15/2020 09:33 AM   Modules accepted: Orders

## 2020-04-15 NOTE — Telephone Encounter (Signed)
Phenytoin XR 100 mg, 4 daily, #120, 1 year refill

## 2020-04-20 ENCOUNTER — Other Ambulatory Visit: Payer: Self-pay | Admitting: Family Medicine

## 2020-04-20 ENCOUNTER — Ambulatory Visit: Payer: Self-pay

## 2020-04-20 DIAGNOSIS — I480 Paroxysmal atrial fibrillation: Secondary | ICD-10-CM
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
## 2020-05-12 ENCOUNTER — Encounter: Payer: Self-pay | Admitting: Family Medicine

## 2020-05-12 ENCOUNTER — Ambulatory Visit (INDEPENDENT_AMBULATORY_CARE_PROVIDER_SITE_OTHER): Payer: BC Managed Care – PPO | Admitting: Family Medicine

## 2020-05-12 VITALS — Temp 98.7°F | Wt 240.0 lb

## 2020-05-12 DIAGNOSIS — J069 Acute upper respiratory infection, unspecified: Secondary | ICD-10-CM

## 2020-05-12 NOTE — Progress Notes (Signed)
Virtual telephone visit    Virtual Visit via Telephone Note  Virtual Visit via Telephone Note  I connected with [name] on 05/14/20 at  2:20 PM EDT by telephone and verified that I am speaking with the correct person using two identifiers.  Location: Patient: Home Provider: Office   I discussed the limitations, risks, security and privacy concerns of performing an evaluation and management service by telephone and the availability of in person appointments. I also discussed with the patient that there may be a patient responsible charge related to this service. The patient expressed understanding and agreed to proceed.   History of Present Illness:    Observations/Objective:   Assessment and Plan:   Follow Up Instructions:    I discussed the assessment and treatment plan with the patient. The patient was provided an opportunity to ask questions and all were answered. The patient agreed with the plan and demonstrated an understanding of the instructions.   The patient was advised to call back or seek an in-person evaluation if the symptoms worsen or if the condition fails to improve as anticipated.  I provided 12 minutes of non-face-to-face time during this encounter.   Richard Wendelyn Breslow, MD  This visit type was conducted due to national recommendations for restrictions regarding the COVID-19 Pandemic (e.g. social distancing) in an effort to limit this patient's exposure and mitigate transmission in our community. Due to her co-morbid illnesses, this patient is at least at moderate risk for complications without adequate follow up. This format is felt to be most appropriate for this patient at this time. The patient did not have access to video technology or had technical difficulties with video requiring transitioning to audio format only (telephone). Physical exam was limited to content and character of the telephone converstion.    Patient location:  Home Provider location: Office   Visit Date: 05/12/2020  Today's healthcare provider: Megan Mans, MD   Chief Complaint  Patient presents with  . URI   Subjective    Sinus Problem Associated symptoms include congestion, coughing, sinus pressure and sneezing. Pertinent negatives include no chills, ear pain or shortness of breath.   HPI    URI    Associated symptoms inlclude headache, cough, congestion, rhinorrhea, sinus pain and sneezing.  Recent episode started in the past 7 days.  The problem has been gradually worsening since onset.  The temperature has been with in normal range.  Patient  is drinking plenty of fluids.  Patient is not a smoker.       Last edited by Myles Lipps, CMA on 05/12/2020  2:07 PM. (History)       Patient Active Problem List   Diagnosis Date Noted  . Delayed surgical wound healing 01/03/2018  . 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 Cervical spine arthritis 11/09/2017  . H/O maze procedure 11/09/2017  . H/O tricuspid valve repair 11/09/2017  . H/O mitral valve replacement with mechanical valve 11/09/2017  . History of Coumadin therapy 11/06/2017  . Chronic diastolic heart failure, NYHA class 2 (HCC) 08/30/2017  . Chronic bilateral low back pain 10/13/2016  . Recurrent oral herpes simplex 04/30/2015  . Arthritis 04/15/2015  . Gonalgia 04/15/2015  . Cervical polyp 04/15/2015  . Encounter for screening for diabetes mellitus 04/15/2015  . Edema 04/15/2015  . Abnormal liver enzymes 04/15/2015  . Essential (primary) hypertension 04/15/2015  . H/O head injury 04/15/2015  . Hypercholesteremia 04/15/2015  . Mitral regurgitation and mitral stenosis  04/15/2015  . Morbid obesity with BMI of 40.0-44.9, adult (HCC) 04/15/2015  . AF (paroxysmal atrial fibrillation) (HCC) 04/15/2015  . RBBB (right bundle branch block) 04/15/2015  . Cerebral seizure (HCC) 04/15/2015  . 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 Urge incontinence  04/15/2015  . Avitaminosis D 04/15/2015  . Arthritis of right hip 11/06/2014  . 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 Breath shortness 07/28/2014   Social History   Tobacco Use  . Smoking status: Never Smoker  . Smokeless tobacco: Never Used  Vaping Use  . Vaping Use: Never used  Substance Use Topics  . Alcohol use: No    Comment: OCCASIONAL WINE ONLY  . Drug use: No      Medications: Outpatient Medications Prior to Visit  Medication Sig  . acetaminophen (TYLENOL) 500 MG tablet Take 1,000 mg by mouth every 6 (six) hours as needed for mild pain or moderate pain.  Marland Kitchen aspirin 81 MG chewable tablet Chew 81 mg by mouth daily.  Marland Kitchen DILANTIN 100 MG ER capsule TAKE FOUR CAPSULES DAILY  . metoprolol succinate (TOPROL-XL) 25 MG 24 hr tablet Take 25 mg by mouth daily.  . Multiple Vitamin (MULTIVITAMIN WITH MINERALS) TABS tablet Take 1 tablet by mouth at bedtime.  . Omega-3 Fatty Acids (FISH OIL) 1200 MG CAPS Take 1 capsule by mouth 2 (two) times daily.   . potassium chloride (K-DUR) 10 MEQ tablet Take 3 tablets (30 mEq total) by mouth 2 (two) times daily.  . psyllium (REGULOID) 0.52 G capsule Take 0.52 g by mouth 2 (two) times daily.  Marland Kitchen torsemide (DEMADEX) 20 MG tablet Take 2 tablets (40 mg total) by mouth 2 (two) times daily.  . valACYclovir (VALTREX) 1000 MG tablet Take 1 tablet (1,000 mg total) by mouth 2 (two) times daily. For one day.  . warfarin (COUMADIN) 4 MG tablet TAKE ONE TABLET BY MOUTH EVERY DAY WITH A 3MG  TABLET  . warfarin (COUMADIN) 5 MG tablet Take 1 tablet (5 mg total) by mouth daily.  . chlorpheniramine-HYDROcodone (TUSSIONEX PENNKINETIC ER) 10-8 MG/5ML SUER Take 5 mLs by mouth every 12 (twelve) hours as needed for cough. (Patient not taking: Reported on 06/20/2019)  . warfarin (COUMADIN) 3 MG tablet Take 2 tablets (6 mg total) by mouth daily. (Patient not taking: Reported on 05/12/2020)  . warfarin (COUMADIN) 6 MG tablet TAKE 1 TABLET BY MOUTH DAILY (Patient  not taking: Reported on 05/12/2020)  . [DISCONTINUED] folic acid (FOLVITE) 1 MG tablet Take 1 mg by mouth daily. (Patient not taking: Reported on 05/12/2020)  . [DISCONTINUED] gabapentin (NEURONTIN) 100 MG capsule Take 100 mg by mouth 3 (three) times daily. (Patient not taking: Reported on 05/12/2020)  . [DISCONTINUED] potassium chloride (KLOR-CON) 10 MEQ tablet Take 30 mEq by mouth 2 (two) times daily.  . [DISCONTINUED] silver sulfADIAZINE (SILVADENE) 1 % cream Apply 1 application topically daily. (Patient not taking: Reported on 12/23/2019)   No facility-administered medications prior to visit.    Review of Systems  Constitutional: Negative for appetite change, chills, fatigue and fever.  HENT: Positive for congestion, postnasal drip, rhinorrhea, sinus pressure, sinus pain and sneezing. Negative for ear pain.   Respiratory: Positive for cough. Negative for chest tightness, shortness of breath and wheezing.   Cardiovascular: Negative for chest pain and palpitations.    Last CBC Lab Results  Component Value Date   WBC 5.1 12/25/2019   HGB 14.9 12/25/2019   HCT 43.5 12/25/2019   MCV 91 12/25/2019   MCH 31.1  12/25/2019   RDW 14.0 12/25/2019   PLT 179 12/25/2019   Last metabolic panel Lab Results  Component Value Date   GLUCOSE 92 12/25/2019   NA 142 12/25/2019   K 4.3 12/25/2019   CL 101 12/25/2019   CO2 25 12/25/2019   BUN 17 12/25/2019   CREATININE 0.92 12/25/2019   GFRNONAA 66 12/25/2019   GFRAA 76 12/25/2019   CALCIUM 9.0 12/25/2019   PHOS 3.8 01/22/2018   PROT 7.1 12/25/2019   ALBUMIN 4.3 12/25/2019   LABGLOB 2.8 12/25/2019   AGRATIO 1.5 12/25/2019   BILITOT 0.4 12/25/2019   ALKPHOS 170 (H) 12/25/2019   AST 33 12/25/2019   ALT 28 12/25/2019   ANIONGAP 11 11/07/2014      Objective    Temp 98.7 F (37.1 C) (Oral)   Wt 240 lb (108.9 kg)   BMI 42.51 kg/m  BP Readings from Last 3 Encounters:  04/14/20 122/66  01/27/20 131/83  12/23/19 127/83   Wt Readings  from Last 3 Encounters:  05/12/20 240 lb (108.9 kg)  04/14/20 250 lb (113.4 kg)  01/27/20 248 lb 3.2 oz (112.6 kg)        Assessment & Plan     1. Viral URI Would not recommend antibiotics.  Recommend Covid testing despite having Covid vaccine.  Use Tylenol fluids and Robitussin for her congestion.  No follow-ups on file.    I discussed the assessment and treatment plan with the patient. The patient was provided an opportunity to ask questions and all were answered. The patient agreed with the plan and demonstrated an understanding of the instructions.   The patient was advised to call back or seek an in-person evaluation if the symptoms worsen or if the condition fails to improve as anticipated.  I provided 12 minutes of non-face-to-face time during this encounter.   Megan Mans, MD [facility] [phone] (phone) [phone] (fax)  [facility]

## 2020-05-13 ENCOUNTER — Ambulatory Visit: Payer: BC Managed Care – PPO | Attending: Internal Medicine

## 2020-05-13 DIAGNOSIS — Z20822 Contact with and (suspected) exposure to covid-19: Secondary | ICD-10-CM

## 2020-05-14 LAB — SARS-COV-2, NAA 2 DAY TAT

## 2020-05-14 LAB — NOVEL CORONAVIRUS, NAA: SARS-CoV-2, NAA: NOT DETECTED

## 2020-05-18 ENCOUNTER — Telehealth: Payer: Self-pay

## 2020-05-18 ENCOUNTER — Other Ambulatory Visit: Payer: Self-pay

## 2020-05-18 ENCOUNTER — Ambulatory Visit: Payer: BC Managed Care – PPO

## 2020-05-18 DIAGNOSIS — I050 Rheumatic mitral stenosis: Secondary | ICD-10-CM

## 2020-05-18 DIAGNOSIS — I480 Paroxysmal atrial fibrillation: Secondary | ICD-10-CM

## 2020-05-18 LAB — POCT INR
INR: 3.9 — AB (ref 2.0–3.0)
PT: 46.5

## 2020-05-18 MED ORDER — WARFARIN SODIUM 4 MG PO TABS: ORAL_TABLET | ORAL | 5 refills | Status: DC

## 2020-05-18 NOTE — Telephone Encounter (Signed)
Clarified dosing

## 2020-05-18 NOTE — Telephone Encounter (Signed)
Copied from CRM [id number]. Topic: General - Other >> May 18, 2020  1:39 PM Mcneil, Ja-Kwan wrote: Reason for CRM: Pt stated she was seen today and she is a little confused about the instructions she was given. Pt requests call back

## 2020-05-18 NOTE — Patient Instructions (Signed)
Description   Change to 5 mg q d except 5 mg on M / W / F

## 2020-06-01 ENCOUNTER — Ambulatory Visit (INDEPENDENT_AMBULATORY_CARE_PROVIDER_SITE_OTHER): Payer: Medicare PPO

## 2020-06-01 ENCOUNTER — Other Ambulatory Visit: Payer: Self-pay

## 2020-06-01 DIAGNOSIS — I050 Rheumatic mitral stenosis: Secondary | ICD-10-CM | POA: Diagnosis not present

## 2020-06-01 LAB — POCT INR
INR: 3.1 — AB (ref 2.0–3.0)
PT: 37.7

## 2020-06-01 NOTE — Patient Instructions (Signed)
Description   5 mg q d except 5 mg on M / W / F Return in 3 weeks

## 2020-06-14 NOTE — Progress Notes (Signed)
Established patient visit   Patient: Victoria Davila   DOB: January 04, 1956   64 y.o. Female  MRN: [id number] Visit Date: 06/15/2020  Today's healthcare provider: Megan Mans, MD   Chief Complaint  Patient presents with   Cerebral seizure   Lymphedema   Velora Mediate as a scribe for Megan Mans, MD.,have documented all relevant documentation on the behalf of Megan Mans, MD,as directed by  Megan Mans, MD while in the presence of Megan Mans, MD.  Subjective    HPI patient is feeling well and has no complaints. Both of her sons are getting married in the next few months and she is excited. She is worried about how the Covid pandemic will affect these weddings at this time.  Cerebral seizure (HCC) From 04/14/2020-labs checked showing-Dilantin level slightly low. Increased from 300 to 400 mg daily. Follow-up in about 2 months.  Lymphedema From 04/14/2020-Return in about 4 weeks. Patient reports symptoms are unchanged.      Medications: Outpatient Medications Prior to Visit  Medication Sig   acetaminophen (TYLENOL) 500 MG tablet Take 1,000 mg by mouth every 6 (six) hours as needed for mild pain or moderate pain.   aspirin 81 MG chewable tablet Chew 81 mg by mouth daily.   DILANTIN 100 MG ER capsule TAKE FOUR CAPSULES DAILY   metoprolol succinate (TOPROL-XL) 25 MG 24 hr tablet Take 25 mg by mouth daily.   Multiple Vitamin (MULTIVITAMIN WITH MINERALS) TABS tablet Take 1 tablet by mouth at bedtime.   Omega-3 Fatty Acids (FISH OIL) 1200 MG CAPS Take 1 capsule by mouth 2 (two) times daily.    potassium chloride (K-DUR) 10 MEQ tablet Take 3 tablets (30 mEq total) by mouth 2 (two) times daily.   psyllium (REGULOID) 0.52 G capsule Take 0.52 g by mouth 2 (two) times daily.   torsemide (DEMADEX) 20 MG tablet Take 2 tablets (40 mg total) by mouth 2 (two) times daily.   valACYclovir (VALTREX) 1000 MG tablet Take 1 tablet (1,000  mg total) by mouth 2 (two) times daily. For one day.   warfarin (COUMADIN) 3 MG tablet Take 2 tablets (6 mg total) by mouth daily.   warfarin (COUMADIN) 4 MG tablet TAKE ONE TABLET BY MOUTH EVERY DAY WITH A 3MG  TABLET   warfarin (COUMADIN) 5 MG tablet Take 1 tablet (5 mg total) by mouth daily.   warfarin (COUMADIN) 6 MG tablet TAKE 1 TABLET BY MOUTH DAILY   [DISCONTINUED] chlorpheniramine-HYDROcodone (TUSSIONEX PENNKINETIC ER) 10-8 MG/5ML SUER Take 5 mLs by mouth every 12 (twelve) hours as needed for cough. (Patient not taking: Reported on 06/20/2019)   No facility-administered medications prior to visit.    Review of Systems  Constitutional: Negative for appetite change, chills, fatigue and fever.  Respiratory: Negative for chest tightness and shortness of breath.   Cardiovascular: Negative for chest pain and palpitations.  Gastrointestinal: Negative for abdominal pain, nausea and vomiting.  Neurological: Negative for dizziness and weakness.       Objective    BP 128/83 (BP Location: Right Arm, Patient Position: Sitting, Cuff Size: Large)    Pulse (!) 109    Temp 97.8 F (36.6 C) (Oral)    Ht 5\' 3"  (1.6 m)    Wt 246 lb 3.2 oz (111.7 kg)    BMI 43.61 kg/m  BP Readings from Last 3 Encounters:  06/15/20 128/83  04/14/20 122/66  01/27/20 131/83   Wt Readings from Last 3 Encounters:  06/15/20 246 lb 3.2 oz (111.7 kg)  05/12/20 240 lb (108.9 kg)  04/14/20 250 lb (113.4 kg)      Physical Exam   General: Appearance:    Severely obese female in no acute distress  Eyes:    PERRL, conjunctiva/corneas clear, EOM's intact       Lungs:     Clear to auscultation bilaterally, respirations unlabored  Heart:    Tachycardic. Regular rhythm.  2/6 blowing, holosystolic murmur at apex 2/6 low pitched, rumbling, decrescendo, diastolic murmur at apex A metallic click heard  MS:   All extremities are intact.   Neurologic:   Awake, alert, oriented x 3. No apparent focal neurological            defect.         No results found for any visits on 06/15/20.  Assessment & Plan    1. Cerebral seizure (HCC)  - DILANTIN 100 MG ER capsule; TAKE FOUR CAPSULES DAILY  Dispense: 120 capsule; Refill: 5 - Dilantin (Phenytoin) level, total  2. Lymphedema   3. Encounter for screening mammogram for malignant neoplasm of breast  - MM Digital Screening; Future   No follow-ups on file.         Richard Wendelyn Breslow, MD  [facility] [phone] (phone) [phone] (fax)  [facility]

## 2020-06-15 ENCOUNTER — Encounter: Payer: Self-pay | Admitting: Family Medicine

## 2020-06-15 ENCOUNTER — Other Ambulatory Visit: Payer: Self-pay

## 2020-06-15 ENCOUNTER — Ambulatory Visit (INDEPENDENT_AMBULATORY_CARE_PROVIDER_SITE_OTHER): Payer: Medicare PPO | Admitting: Family Medicine

## 2020-06-15 VITALS — BP 128/83 | HR 109 | Temp 97.8°F | Ht 63.0 in | Wt 246.2 lb

## 2020-06-15 DIAGNOSIS — I890 Lymphedema, not elsewhere classified: Secondary | ICD-10-CM

## 2020-06-15 DIAGNOSIS — Z1231 Encounter for screening mammogram for malignant neoplasm of breast: Secondary | ICD-10-CM

## 2020-06-15 DIAGNOSIS — G40909 Epilepsy, unspecified, not intractable, without status epilepticus: Secondary | ICD-10-CM | POA: Diagnosis not present

## 2020-06-15 MED ORDER — DILANTIN 100 MG PO CAPS: ORAL_CAPSULE | ORAL | 5 refills | Status: DC

## 2020-06-16 LAB — PHENYTOIN LEVEL, TOTAL: Phenytoin (Dilantin), Serum: 18.1 ug/mL (ref 10.0–20.0)

## 2020-06-21 ENCOUNTER — Ambulatory Visit: Payer: Self-pay | Admitting: *Deleted

## 2020-06-21 ENCOUNTER — Telehealth: Payer: Self-pay

## 2020-06-21 NOTE — Telephone Encounter (Signed)
-----   Message from Maple Hudson., MD sent at 06/21/2020  7:45 AM EDT ----- Level on the high side of normal.  Patient presently taking 400 mg of phenytoin today.  Try 400 mg on even numbered days and 300 mg on odd days.  Phenytoin level in 1 month.

## 2020-06-21 NOTE — Telephone Encounter (Signed)
Pt called regarding a recent medication, dilantin, change. She states that she is needing clarification on how to use her medication correctly. Please advise.   Level on the high side of normal. Patient presently taking 400 mg of phenytoin today. Try 400 mg on even numbered days and 300 mg on odd days. Phenytoin level in 1 month.  Reviewed instructions with patient- she will use calender days even /odd. Voices understanding  Reason for Disposition  Caller has medicine question only, adult not sick, AND triager answers question  Answer Assessment - Initial Assessment Questions 1. NAME of MEDICATION: "What medicine are you calling about?"     Patient was advised to change her dosing of Dilantin- she just wanted to make sure she understood correctly 2. QUESTION: "What is your question?" (e.g., medication refill, side effect)     Dilantin 300/400 mg 3. PRESCRIBING HCP: "Who prescribed it?" Reason: if prescribed by specialist, call should be referred to that group.     PCP  Protocols used: MEDICATION QUESTION CALL-A-AH

## 2020-06-21 NOTE — Telephone Encounter (Signed)
Patient given lab results via mychart and has read the providers comments.

## 2020-06-22 ENCOUNTER — Other Ambulatory Visit: Payer: Self-pay

## 2020-06-22 ENCOUNTER — Ambulatory Visit: Payer: Medicare PPO

## 2020-06-22 DIAGNOSIS — I050 Rheumatic mitral stenosis: Secondary | ICD-10-CM

## 2020-06-22 LAB — POCT INR
INR: 3.2 — AB (ref 2.0–3.0)
Prothrombin Time: 38.1

## 2020-06-22 NOTE — Patient Instructions (Signed)
Description    No changes, continue 5 mg qd except 4mg  on M / W / F Return in 4 weeks

## 2020-07-01 ENCOUNTER — Ambulatory Visit: Payer: Self-pay | Admitting: Family Medicine

## 2020-07-02 ENCOUNTER — Other Ambulatory Visit: Payer: Self-pay

## 2020-07-02 ENCOUNTER — Other Ambulatory Visit: Payer: BC Managed Care – PPO

## 2020-07-02 DIAGNOSIS — Z20822 Contact with and (suspected) exposure to covid-19: Secondary | ICD-10-CM

## 2020-07-03 LAB — NOVEL CORONAVIRUS, NAA: SARS-CoV-2, NAA: NOT DETECTED

## 2020-07-19 ENCOUNTER — Other Ambulatory Visit: Payer: Self-pay

## 2020-07-20 ENCOUNTER — Ambulatory Visit: Payer: Self-pay

## 2020-07-21 ENCOUNTER — Ambulatory Visit: Payer: Medicare PPO

## 2020-07-21 ENCOUNTER — Other Ambulatory Visit: Payer: Self-pay

## 2020-07-21 DIAGNOSIS — I050 Rheumatic mitral stenosis: Secondary | ICD-10-CM | POA: Diagnosis not present

## 2020-07-21 LAB — POCT INR
INR: 1.4 — AB (ref 2.0–3.0)
PT: 17.3

## 2020-07-21 NOTE — Patient Instructions (Signed)
Description   5 mg daily, f/u 1 week     

## 2020-07-26 ENCOUNTER — Ambulatory Visit: Payer: BC Managed Care – PPO | Admitting: Family Medicine

## 2020-07-28 ENCOUNTER — Other Ambulatory Visit: Payer: Self-pay

## 2020-07-28 ENCOUNTER — Ambulatory Visit: Payer: Medicare PPO

## 2020-07-28 DIAGNOSIS — I050 Rheumatic mitral stenosis: Secondary | ICD-10-CM | POA: Diagnosis not present

## 2020-07-28 DIAGNOSIS — I052 Rheumatic mitral stenosis with insufficiency: Secondary | ICD-10-CM

## 2020-07-28 LAB — POCT INR
INR: 2.1 (ref 2.0–3.0)
Prothrombin Time: 25.7

## 2020-07-28 MED ORDER — WARFARIN SODIUM 6 MG PO TABS: 6.0000 mg | ORAL_TABLET | Freq: Every day | ORAL | 12 refills | Status: DC

## 2020-07-28 NOTE — Patient Instructions (Signed)
Description   5mg  daily except Wednesday and Saturday take 6mg . Return in 1-2 weeks

## 2020-08-02 ENCOUNTER — Ambulatory Visit: Payer: Self-pay | Attending: Internal Medicine

## 2020-08-02 DIAGNOSIS — Z23 Encounter for immunization: Secondary | ICD-10-CM

## 2020-08-02 NOTE — Progress Notes (Signed)
   Covid-19 Vaccination Clinic  Name:  Victoria Davila    MRN: [id number] DOB: Nov 23, 1955  08/02/2020  Victoria Davila was observed post Covid-19 immunization for 15 minutes without incident. She was provided with Vaccine Information Sheet and instruction to access the V-Safe system.   Victoria Davila was instructed to call [phone] with any severe reactions post vaccine: Marland Kitchen Difficulty breathing  . Swelling of face and throat  . A fast heartbeat  . A bad rash all over body  . Dizziness and weakness

## 2020-08-03 ENCOUNTER — Encounter: Payer: Self-pay | Admitting: Family Medicine

## 2020-08-03 ENCOUNTER — Ambulatory Visit: Payer: Medicare PPO | Admitting: Family Medicine

## 2020-08-03 ENCOUNTER — Other Ambulatory Visit: Payer: Self-pay

## 2020-08-03 VITALS — BP 139/80 | HR 106 | Resp 18 | Wt 247.0 lb

## 2020-08-03 DIAGNOSIS — B372 Candidiasis of skin and nail: Secondary | ICD-10-CM

## 2020-08-03 DIAGNOSIS — N898 Other specified noninflammatory disorders of vagina: Secondary | ICD-10-CM | POA: Diagnosis not present

## 2020-08-03 LAB — POCT URINALYSIS DIPSTICK
Appearance: NORMAL
Bilirubin, UA: NEGATIVE
Blood, UA: NEGATIVE
Glucose, UA: NEGATIVE
Ketones, UA: NEGATIVE
Nitrite, UA: NEGATIVE
Odor: NORMAL
Protein, UA: POSITIVE — AB
Spec Grav, UA: 1.01 (ref 1.010–1.025)
Urobilinogen, UA: 0.2 E.U./dL
pH, UA: 6 (ref 5.0–8.0)

## 2020-08-03 MED ORDER — CLOTRIMAZOLE 1 % EX CREA: 1.0000 "application " | TOPICAL_CREAM | Freq: Two times a day (BID) | CUTANEOUS | 1 refills | Status: DC

## 2020-08-03 NOTE — Progress Notes (Signed)
I,Onetta Spainhower,acting as a scribe for Megan Mans, MD.,have documented all relevant documentation on the behalf of Megan Mans, MD,as directed by  Megan Mans, MD while in the presence of Megan Mans, MD.   Established patient visit   Patient: Victoria Davila   DOB: 08-14-1956   64 y.o. Female  MRN: [id number] Visit Date: 08/03/2020  Today's healthcare provider: Megan Mans, MD   Chief Complaint  Patient presents with   Rash   Subjective    HPI  Patient has had a rash for 2 months. Rash is on her vagina, lower stomach, upper thighs, crease of legs, under breasts and underarms. Rash is red and very itchy. Patients has been using over the counter creams to treat rash with mild relief. Patient also believes she may have a external yeast infection.  She has no urinary symptoms. She has had both Covid vaccines.  Past Medical History:  Diagnosis Date   Dysrhythmia    A FIB / FOLLOWED BY DR Nolon Rod AT [facility]   Edema of both legs    CHRONIC   Hyperlipidemia    Hypertension    Mitral valve disorder    "deformed"   Neuromuscular disorder (HCC)    Rheumatoid arthritis (HCC)    RECENT DX - HAS NOT YET SEEN RHEUMATOLOGIST   Seizures (HCC)    LAST SEIZURE JAN 1995   Shortness of breath dyspnea    WITH EXERTION       Medications: Outpatient Medications Prior to Visit  Medication Sig   acetaminophen (TYLENOL) 500 MG tablet Take 1,000 mg by mouth every 6 (six) hours as needed for mild pain or moderate pain.   aspirin 81 MG chewable tablet Chew 81 mg by mouth daily.   DILANTIN 100 MG ER capsule TAKE FOUR CAPSULES DAILY   metoprolol succinate (TOPROL-XL) 25 MG 24 hr tablet Take 25 mg by mouth daily.   Multiple Vitamin (MULTIVITAMIN WITH MINERALS) TABS tablet Take 1 tablet by mouth at bedtime.   Omega-3 Fatty Acids (FISH OIL) 1200 MG CAPS Take 1 capsule by mouth 2 (two) times daily.    potassium chloride (K-DUR) 10 MEQ  tablet Take 3 tablets (30 mEq total) by mouth 2 (two) times daily.   psyllium (REGULOID) 0.52 G capsule Take 0.52 g by mouth 2 (two) times daily.   torsemide (DEMADEX) 20 MG tablet Take 2 tablets (40 mg total) by mouth 2 (two) times daily.   valACYclovir (VALTREX) 1000 MG tablet Take 1 tablet (1,000 mg total) by mouth 2 (two) times daily. For one day.   warfarin (COUMADIN) 3 MG tablet Take 2 tablets (6 mg total) by mouth daily.   warfarin (COUMADIN) 4 MG tablet TAKE ONE TABLET BY MOUTH EVERY DAY WITH A 3MG  TABLET   warfarin (COUMADIN) 5 MG tablet Take 1 tablet (5 mg total) by mouth daily.   warfarin (COUMADIN) 6 MG tablet Take 1 tablet (6 mg total) by mouth daily.   No facility-administered medications prior to visit.    Review of Systems     Objective    BP 139/80 (BP Location: Right Arm, Patient Position: Sitting, Cuff Size: Large)    Pulse (!) 106    Resp 18    Wt 247 lb (112 kg)    SpO2 98%    BMI 43.75 kg/m    Physical Exam Vitals reviewed.  Constitutional:      General: She is not in acute distress.  Appearance: She is well-developed. She is obese.  HENT:     Head: Normocephalic and atraumatic.     Right Ear: Hearing, tympanic membrane and ear canal normal.     Left Ear: Hearing, tympanic membrane and ear canal normal.     Nose: Nose normal.     Mouth/Throat:     Mouth: Mucous membranes are moist.     Pharynx: Oropharynx is clear. Uvula midline.     Tonsils: No tonsillar exudate or tonsillar abscesses.  Eyes:     General: Lids are normal. No scleral icterus.       Right eye: No discharge.        Left eye: No discharge.     Conjunctiva/sclera: Conjunctivae normal.  Cardiovascular:     Rate and Rhythm: Normal rate and regular rhythm.     Heart sounds: Normal heart sounds.  Pulmonary:     Effort: Pulmonary effort is normal. No respiratory distress.     Breath sounds: Normal breath sounds.  Musculoskeletal:     Cervical back: Normal range of motion and neck  supple.     Comments: Chronic lymphedema both lower extremities.  Lymphadenopathy:     Cervical: No cervical adenopathy.  Skin:    General: Skin is warm.     Findings: Rash present. No lesion.     Comments: In her groin region she has a pannus that is causing chronic erythema and moisture and appearance of yeast dermatitis.  Neurological:     General: No focal deficit present.     Mental Status: She is alert and oriented to person, place, and time.  Psychiatric:        Mood and Affect: Mood normal.        Speech: Speech normal.        Behavior: Behavior normal.        Thought Content: Thought content normal.        Judgment: Judgment normal.        Results for orders placed or performed in visit on 08/03/20  POCT urinalysis dipstick  Result Value Ref Range   Color, UA Yellow    Clarity, UA Cloudy    Glucose, UA Negative Negative   Bilirubin, UA Negative    Ketones, UA Negative    Spec Grav, UA 1.010 1.010 - 1.025   Blood, UA Negative    pH, UA 6.0 5.0 - 8.0   Protein, UA Positive (A) Negative   Urobilinogen, UA 0.2 0.2 or 1.0 E.U./dL   Nitrite, UA Negative    Leukocytes, UA Moderate (2+) (A) Negative   Appearance Normal    Odor Normal     Assessment & Plan     1. Vaginal itching  - POCT urinalysis dipstick   2. Yeast dermatitis Treat with local care keeping the area dry with daily washing and then blow drying the area.  Change out of any wet clothing.  Try clotrimazole cream.  May need dermatology referral in the future.   No follow-ups on file.      I, Megan Mans, MD, have reviewed all documentation for this visit. The documentation on 08/04/20 for the exam, diagnosis, procedures, and orders are all accurate and complete.    Richard Wendelyn Breslow, MD  [facility] [phone] (phone) [phone] (fax)  [facility]

## 2020-08-05 ENCOUNTER — Ambulatory Visit: Payer: Self-pay | Admitting: Family Medicine

## 2020-08-05 NOTE — Telephone Encounter (Signed)
Pt's husband called on her behalf; she is not currently with the pt; pt advised to have hs wife call back to discuss symptoms; he verbalized understanding and request that she be contacted at [phone]; will attempt to contact pt.

## 2020-08-05 NOTE — Telephone Encounter (Signed)
Caller received [employer] booster shot on 08/02/20. Last night injection site red and itching. This morning reports injection site looks like it has grown in size, warm to touch and red. Left arm red 2-3 inches across. Denies pain or fever or difficulty breathing . Just noted swelling in left arm and concerned if she should be seen by PCP. Care advise given. Patient verbalized understanding of care advise and to call back or go to ED if symptoms worsen. Please advise if appt needed.   Reason for Disposition  [1] Redness around the injection site AND [2] started > 48 hours after getting vaccine AND [3] no fever  (Exception: red area < 1 inch or 2.5 cm wide)  Answer Assessment - Initial Assessment Questions 1. MAIN CONCERN OR SYMPTOM:  "What is your main concern right now?" "What question do you have?" "What's the main symptom you're worried about?" (e.g., fever, pain, redness, swelling)     Left arm swollen, hot to touch, increased in size at injection site 2/3 inches across. 2. VACCINE: "What vaccination did you receive?" "Is this your first or second shot?" (e.g., none; AstraZeneca, J&J, Moderna, [employer], other)     Pfizer booster shot on 08/02/20 3. SYMPTOM ONSET: "When did the reaction  begin?" (e.g., not relevant; hours, days)      Last night  4. SYMPTOM SEVERITY: "How bad is it?"      Not severe 5. FEVER: "Is there a fever?" If Yes, ask: "What is it, how was it measured, and when did it start?"      no 6. PAST REACTIONS: "Have you reacted to immunizations before?" If Yes, ask: "What happened?"     no 7. OTHER SYMPTOMS: "Do you have any other symptoms?"     Itching, redness, swelling  Protocols used: CORONAVIRUS (COVID-19) VACCINE QUESTIONS AND REACTIONS-A-AH

## 2020-08-08 DIAGNOSIS — S52501A Unspecified fracture of the lower end of right radius, initial encounter for closed fracture: Secondary | ICD-10-CM | POA: Diagnosis not present

## 2020-08-08 DIAGNOSIS — S59901A Unspecified injury of right elbow, initial encounter: Secondary | ICD-10-CM | POA: Diagnosis not present

## 2020-08-08 DIAGNOSIS — S52601A Unspecified fracture of lower end of right ulna, initial encounter for closed fracture: Secondary | ICD-10-CM | POA: Diagnosis not present

## 2020-08-08 DIAGNOSIS — W010XXA Fall on same level from slipping, tripping and stumbling without subsequent striking against object, initial encounter: Secondary | ICD-10-CM | POA: Diagnosis not present

## 2020-08-08 DIAGNOSIS — S6991XA Unspecified injury of right wrist, hand and finger(s), initial encounter: Secondary | ICD-10-CM | POA: Diagnosis not present

## 2020-08-10 DIAGNOSIS — M25531 Pain in right wrist: Secondary | ICD-10-CM | POA: Diagnosis not present

## 2020-08-10 DIAGNOSIS — S52231A Displaced oblique fracture of shaft of right ulna, initial encounter for closed fracture: Secondary | ICD-10-CM | POA: Diagnosis not present

## 2020-08-17 DIAGNOSIS — M25531 Pain in right wrist: Secondary | ICD-10-CM | POA: Diagnosis not present

## 2020-08-17 DIAGNOSIS — S52231D Displaced oblique fracture of shaft of right ulna, subsequent encounter for closed fracture with routine healing: Secondary | ICD-10-CM | POA: Diagnosis not present

## 2020-08-18 ENCOUNTER — Ambulatory Visit: Payer: Medicare PPO

## 2020-08-18 ENCOUNTER — Other Ambulatory Visit: Payer: Self-pay

## 2020-08-18 DIAGNOSIS — I050 Rheumatic mitral stenosis: Secondary | ICD-10-CM

## 2020-08-18 DIAGNOSIS — Z23 Encounter for immunization: Secondary | ICD-10-CM | POA: Diagnosis not present

## 2020-08-18 LAB — POCT INR
INR: 3.9 — AB (ref 2.0–3.0)
PT: 47.2

## 2020-08-18 NOTE — Patient Instructions (Signed)
Description   5mg  daily except Wednesday take 6mg . Return in 1-2 weeks

## 2020-08-31 DIAGNOSIS — S52231D Displaced oblique fracture of shaft of right ulna, subsequent encounter for closed fracture with routine healing: Secondary | ICD-10-CM | POA: Diagnosis not present

## 2020-09-14 DIAGNOSIS — M25531 Pain in right wrist: Secondary | ICD-10-CM | POA: Diagnosis not present

## 2020-09-14 DIAGNOSIS — S52231D Displaced oblique fracture of shaft of right ulna, subsequent encounter for closed fracture with routine healing: Secondary | ICD-10-CM | POA: Diagnosis not present

## 2020-09-15 ENCOUNTER — Ambulatory Visit: Payer: Medicare PPO

## 2020-09-15 ENCOUNTER — Other Ambulatory Visit: Payer: Self-pay

## 2020-09-15 DIAGNOSIS — I050 Rheumatic mitral stenosis: Secondary | ICD-10-CM

## 2020-09-15 LAB — POCT INR
INR: 2.4 (ref 2.0–3.0)
PT: 28.7

## 2020-09-15 NOTE — Patient Instructions (Signed)
Description   5 mg daily except 6 mg on W & Sat.  F/U 3 weeks

## 2020-09-29 ENCOUNTER — Ambulatory Visit (INDEPENDENT_AMBULATORY_CARE_PROVIDER_SITE_OTHER): Payer: Medicare PPO | Admitting: Family Medicine

## 2020-09-29 ENCOUNTER — Encounter: Payer: Self-pay | Admitting: Family Medicine

## 2020-09-29 ENCOUNTER — Other Ambulatory Visit: Payer: Self-pay

## 2020-09-29 VITALS — BP 118/88 | HR 96 | Temp 98.6°F | Wt 251.0 lb

## 2020-09-29 DIAGNOSIS — Z952 Presence of prosthetic heart valve: Secondary | ICD-10-CM

## 2020-09-29 DIAGNOSIS — Z6841 Body Mass Index (BMI) 40.0 and over, adult: Secondary | ICD-10-CM

## 2020-09-29 DIAGNOSIS — I050 Rheumatic mitral stenosis: Secondary | ICD-10-CM | POA: Diagnosis not present

## 2020-09-29 DIAGNOSIS — M1990 Unspecified osteoarthritis, unspecified site: Secondary | ICD-10-CM

## 2020-09-29 DIAGNOSIS — I052 Rheumatic mitral stenosis with insufficiency: Secondary | ICD-10-CM | POA: Diagnosis not present

## 2020-09-29 DIAGNOSIS — G40909 Epilepsy, unspecified, not intractable, without status epilepticus: Secondary | ICD-10-CM | POA: Diagnosis not present

## 2020-09-29 LAB — POCT INR
INR: 3.7 — AB (ref 2.0–3.0)
PT: 44.4

## 2020-09-29 NOTE — Progress Notes (Signed)
Established patient visit   Patient: Victoria Davila   DOB: 1956/03/01   65 y.o. Female  MRN: [id number] Visit Date: 09/29/2020  Today's healthcare provider: Megan Mans, MD   Chief Complaint  Patient presents with  . Hypertension  . Follow-up   Subjective    HPI  She is feeling well.  She has difficulty getting out of a chair and has a lift chair that her father had for years and is now broken.  Because of her arthritis and morbid obesity she needs a lift chair to be able to easily get up and safely get up No signs of gotten married in the last few months.  Mood went well.  Her daughter is married and has children. She has had no seizure activity since stopping phenytoin. Her INR today is 3.7 with goal of 2.5-3.5 Hypertension, follow-up  BP Readings from Last 3 Encounters:  09/29/20 118/88  08/03/20 139/80  06/15/20 128/83   Wt Readings from Last 3 Encounters:  09/29/20 251 lb (113.9 kg)  08/03/20 247 lb (112 kg)  06/15/20 246 lb 3.2 oz (111.7 kg)     She was last seen for hypertension 5 months ago.  BP at that visit was 122/66. Management since that visit includes; Excellent control. She reports excellent compliance with treatment. She is not having side effects.  She is adherent to low salt diet.   Outside blood pressures are not being checked.  She does not smoke.  Use of agents associated with hypertension: none.   ---------------------------------------------------------------------------------------------------  Cerebral seizure (HCC) From 06/15/2020-Labs checked showing-Level on the high side of normal. Patient presently taking 400 mg of phenytoin today. Try 400 mg on even numbered days and 300 mg on odd days. Phenytoin level in 1 month.   PT also needs a prescription for a chair lift.   Patient Active Problem List   Diagnosis Date Noted  . Delayed surgical wound healing 01/03/2018  . 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 Cervical spine arthritis 11/09/2017  . H/O maze procedure 11/09/2017  . H/O tricuspid valve repair 11/09/2017  . H/O mitral valve replacement with mechanical valve 11/09/2017  . History of Coumadin therapy 11/06/2017  . Chronic diastolic heart failure, NYHA class 2 (HCC) 08/30/2017  . Chronic bilateral low back pain 10/13/2016  . Recurrent oral herpes simplex 04/30/2015  . Arthritis 04/15/2015  . Gonalgia 04/15/2015  . Cervical polyp 04/15/2015  . Encounter for screening for diabetes mellitus 04/15/2015  . Edema 04/15/2015  . Abnormal liver enzymes 04/15/2015  . Essential (primary) hypertension 04/15/2015  . H/O head injury 04/15/2015  . Hypercholesteremia 04/15/2015  . Mitral regurgitation and mitral stenosis 04/15/2015  . Morbid obesity with BMI of 40.0-44.9, adult (HCC) 04/15/2015  . AF (paroxysmal atrial fibrillation) (HCC) 04/15/2015  . RBBB (right bundle branch block) 04/15/2015  . Cerebral seizure (HCC) 04/15/2015  . 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 Urge incontinence 04/15/2015  . Avitaminosis D 04/15/2015  . Arthritis of right hip 11/06/2014  . 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 Breath shortness 07/28/2014   Past Medical History:  Diagnosis Date  . Dysrhythmia    A FIB / FOLLOWED BY DR Nolon Rod AT [facility]  . Edema of both legs    CHRONIC  . Hyperlipidemia   . Hypertension   . Mitral valve disorder    "deformed"  . Neuromuscular disorder (HCC)   . Rheumatoid arthritis (HCC)    RECENT DX -  HAS NOT YET SEEN RHEUMATOLOGIST  . Seizures (HCC)    LAST SEIZURE JAN 1995  . Shortness of breath dyspnea    WITH EXERTION   Social History   Tobacco Use  . Smoking status: Never Smoker  . Smokeless tobacco: Never Used  Vaping Use  . Vaping Use: Never used  Substance Use Topics  . Alcohol use: No    Comment: OCCASIONAL WINE ONLY  . Drug use: No   No Known Allergies   Medications: Outpatient Medications Prior to Visit  Medication Sig  .  aspirin 81 MG chewable tablet Chew 81 mg by mouth daily.  . clotrimazole (CLOTRIMAZOLE ANTI-FUNGAL) 1 % cream Apply 1 application topically 2 (two) times daily.  Marland Kitchen DILANTIN 100 MG ER capsule TAKE FOUR CAPSULES DAILY  . metoprolol succinate (TOPROL-XL) 25 MG 24 hr tablet Take 25 mg by mouth daily.  . Multiple Vitamin (MULTIVITAMIN WITH MINERALS) TABS tablet Take 1 tablet by mouth at bedtime.  . Omega-3 Fatty Acids (FISH OIL) 1200 MG CAPS Take 1 capsule by mouth 2 (two) times daily.   . potassium chloride (K-DUR) 10 MEQ tablet Take 3 tablets (30 mEq total) by mouth 2 (two) times daily.  . psyllium (REGULOID) 0.52 G capsule Take 0.52 g by mouth 2 (two) times daily.  Marland Kitchen torsemide (DEMADEX) 20 MG tablet Take 2 tablets (40 mg total) by mouth 2 (two) times daily.  . valACYclovir (VALTREX) 1000 MG tablet Take 1 tablet (1,000 mg total) by mouth 2 (two) times daily. For one day.  . warfarin (COUMADIN) 3 MG tablet Take 2 tablets (6 mg total) by mouth daily.  Marland Kitchen warfarin (COUMADIN) 4 MG tablet TAKE ONE TABLET BY MOUTH EVERY DAY WITH A 3MG  TABLET  . warfarin (COUMADIN) 5 MG tablet Take 1 tablet (5 mg total) by mouth daily.  warfarin (COUMADIN) 6 MG tablet Take 1 tablet (6 mg total) by mouth daily.  Marland Kitchen acetaminophen (TYLENOL) 500 MG tablet Take 1,000 mg by mouth every 6 (six) hours as needed for mild pain or moderate pain.   No facility-administered medications prior to visit.    Review of Systems  Constitutional: Negative.  Negative for appetite change, chills, fatigue and fever.  Respiratory: Negative.  Negative for chest tightness and shortness of breath.   Cardiovascular: Positive for leg swelling (Ankles swell, chronic issue. ). Negative for chest pain and palpitations.  Gastrointestinal: Negative.  Negative for abdominal pain, nausea and vomiting.  Musculoskeletal: Negative for arthralgias.  Neurological: Negative for dizziness, seizures, weakness, light-headedness and headaches.      Objective     BP 118/88 (BP Location: Left Arm, Patient Position: Sitting, Cuff Size: Large)   Pulse 96   Temp 98.6 F (37 C) (Oral)   Wt 251 lb (113.9 kg)   SpO2 97%   BMI 44.46 kg/m    Physical Exam Vitals reviewed.  Constitutional:      General: She is not in acute distress.    Appearance: She is well-developed. She is obese.  HENT:     Head: Normocephalic and atraumatic.     Right Ear: Hearing, tympanic membrane and ear canal normal.     Left Ear: Hearing, tympanic membrane and ear canal normal.     Nose: Nose normal.     Mouth/Throat:     Mouth: Mucous membranes are moist.     Pharynx: Oropharynx is clear. Uvula midline.     Tonsils: No tonsillar exudate or tonsillar abscesses.  Eyes:  General: Lids are normal. No scleral icterus.       Right eye: No discharge.        Left eye: No discharge.     Conjunctiva/sclera: Conjunctivae normal.  Cardiovascular:     Rate and Rhythm: Normal rate and regular rhythm.     Heart sounds: Normal heart sounds.  Pulmonary:     Effort: Pulmonary effort is normal. No respiratory distress.     Breath sounds: Normal breath sounds.  Musculoskeletal:     Cervical back: Normal range of motion and neck supple.     Comments: Chronic lymphedema both lower extremities. She has significant difficulty getting out of the chair.  Lymphadenopathy:     Cervical: No cervical adenopathy.  Skin:    General: Skin is warm and dry.     Findings: No lesion or rash.  Neurological:     General: No focal deficit present.     Mental Status: She is alert and oriented to person, place, and time.  Psychiatric:        Mood and Affect: Mood normal.        Speech: Speech normal.        Behavior: Behavior normal.        Thought Content: Thought content normal.        Judgment: Judgment normal.       No results found for any visits on 09/29/20.  Assessment & Plan     1. Cerebral seizure (HCC) Try to get Dilantin level between 10 and 15. - Dilantin (Phenytoin)  level, total  2. Mitral valve stenosis, unspecified etiology  - POCT INR  3. Mitral stenosis with insufficiency, unspecified etiology   4. Status post mitral valve replacement Goal INR between 2.5 and 3.5.  5. Morbid obesity with BMI of 40.0-44.9, adult [facility]) Patient in need of a lift chair.  Orders written today.  6. Arthritis In need of a lift chair.   No follow-ups on file.         Leoncio Hansen Wendelyn Breslow, MD  [facility] [phone] (phone) [phone] (fax)  [facility]

## 2020-09-29 NOTE — Patient Instructions (Signed)
Change to 5mg  daily except 6mg  on Saturdays.  Recheck in two weeks

## 2020-09-30 LAB — PHENYTOIN LEVEL, TOTAL: Phenytoin (Dilantin), Serum: 12 ug/mL (ref 10.0–20.0)

## 2020-10-05 DIAGNOSIS — M25531 Pain in right wrist: Secondary | ICD-10-CM | POA: Diagnosis not present

## 2020-10-05 DIAGNOSIS — S52231D Displaced oblique fracture of shaft of right ulna, subsequent encounter for closed fracture with routine healing: Secondary | ICD-10-CM | POA: Diagnosis not present

## 2020-10-06 ENCOUNTER — Ambulatory Visit: Payer: Self-pay

## 2020-10-13 ENCOUNTER — Ambulatory Visit (INDEPENDENT_AMBULATORY_CARE_PROVIDER_SITE_OTHER): Payer: Medicare PPO

## 2020-10-13 ENCOUNTER — Other Ambulatory Visit: Payer: Self-pay

## 2020-10-13 DIAGNOSIS — I050 Rheumatic mitral stenosis: Secondary | ICD-10-CM | POA: Diagnosis not present

## 2020-10-13 LAB — POCT INR
INR: 2.7 (ref 2.0–3.0)
PT: 32.4

## 2020-10-13 NOTE — Patient Instructions (Signed)
Description   Change to 5mg  daily except 6mg  on Saturdays.  Recheck in two weeks

## 2020-10-27 ENCOUNTER — Other Ambulatory Visit: Payer: Self-pay

## 2020-10-27 ENCOUNTER — Ambulatory Visit (INDEPENDENT_AMBULATORY_CARE_PROVIDER_SITE_OTHER): Payer: Medicare PPO

## 2020-10-27 DIAGNOSIS — I050 Rheumatic mitral stenosis: Secondary | ICD-10-CM | POA: Diagnosis not present

## 2020-10-27 LAB — POCT INR
INR: 3.4 — AB (ref 2.0–3.0)
PT: 41.1

## 2020-10-27 NOTE — Patient Instructions (Signed)
Description   5mg daily except 6mg on Saturdays.  Recheck in two weeks     

## 2020-11-10 ENCOUNTER — Other Ambulatory Visit: Payer: Self-pay

## 2020-11-10 ENCOUNTER — Ambulatory Visit (INDEPENDENT_AMBULATORY_CARE_PROVIDER_SITE_OTHER): Payer: Medicare PPO

## 2020-11-10 DIAGNOSIS — I050 Rheumatic mitral stenosis: Secondary | ICD-10-CM

## 2020-11-10 LAB — POCT INR
INR: 3.2 — AB (ref 2.0–3.0)
PT: 38.3

## 2020-11-10 NOTE — Patient Instructions (Signed)
Description   5mg  daily except 6mg  on Saturdays.  Recheck in two weeks

## 2020-11-24 ENCOUNTER — Other Ambulatory Visit: Payer: Self-pay

## 2020-11-24 ENCOUNTER — Telehealth: Payer: Self-pay

## 2020-11-24 ENCOUNTER — Ambulatory Visit (INDEPENDENT_AMBULATORY_CARE_PROVIDER_SITE_OTHER): Payer: Medicare PPO

## 2020-11-24 DIAGNOSIS — I050 Rheumatic mitral stenosis: Secondary | ICD-10-CM

## 2020-11-24 LAB — POCT INR
INR: 2.8 (ref 2.0–3.0)
PT: 34

## 2020-11-24 NOTE — Telephone Encounter (Signed)
Please advise 

## 2020-11-24 NOTE — Telephone Encounter (Signed)
Patient has to take prophylaxis med for dental procedures.    [facility] docs usually prescribe but is asking for you to do this.    She takes Amoxicillin 500 mg. 4 capsules 1 hour prior to procedure.  Needs this called to [facility].

## 2020-11-24 NOTE — Patient Instructions (Signed)
Description   5mg  daily except 6mg  on Saturdays.  Recheck in 4 weeks

## 2020-12-01 MED ORDER — AMOXICILLIN 500 MG PO TABS: ORAL_TABLET | ORAL | 4 refills | Status: DC

## 2020-12-01 NOTE — Telephone Encounter (Signed)
Amoxicillin 500 mg 4 p.o. 1 hour before procedure.  For refills.

## 2020-12-01 NOTE — Telephone Encounter (Signed)
Rx sent to pharmacy. Patient was advised. 

## 2020-12-10 ENCOUNTER — Other Ambulatory Visit: Payer: Self-pay

## 2020-12-10 ENCOUNTER — Encounter: Payer: Self-pay | Admitting: Adult Health

## 2020-12-10 ENCOUNTER — Telehealth (INDEPENDENT_AMBULATORY_CARE_PROVIDER_SITE_OTHER): Payer: Medicare PPO | Admitting: Adult Health

## 2020-12-10 DIAGNOSIS — J0140 Acute pansinusitis, unspecified: Secondary | ICD-10-CM | POA: Diagnosis not present

## 2020-12-10 DIAGNOSIS — I052 Rheumatic mitral stenosis with insufficiency: Secondary | ICD-10-CM | POA: Diagnosis not present

## 2020-12-10 DIAGNOSIS — U071 COVID-19: Secondary | ICD-10-CM

## 2020-12-10 DIAGNOSIS — J029 Acute pharyngitis, unspecified: Secondary | ICD-10-CM

## 2020-12-10 DIAGNOSIS — Z20822 Contact with and (suspected) exposure to covid-19: Secondary | ICD-10-CM

## 2020-12-10 MED ORDER — AMOXICILLIN-POT CLAVULANATE 875-125 MG PO TABS: 1.0000 | ORAL_TABLET | Freq: Two times a day (BID) | ORAL | 0 refills | Status: DC

## 2020-12-10 NOTE — Patient Instructions (Signed)
COVID-19 Quarantine vs. Isolation QUARANTINE keeps someone who was in close contact with someone who has COVID-19 away from others. Quarantine if you have been in close contact with someone who has COVID-19, unless you have been fully vaccinated. If you are fully vaccinated  You do NOT need to quarantine unless they have symptoms  Get tested 3-5 days after your exposure, even if you don't have symptoms  Wear a mask indoors in public for 14 days following exposure or until your test result is negative If you are not fully vaccinated  Stay home for 14 days after your last contact with a person who has COVID-19  Watch for fever (100.71F), cough, shortness of breath, or other symptoms of COVID-19  If possible, stay away from people you live with, especially people who are at higher risk for getting very sick from COVID-19  Contact your local public health department for options in your area to possibly shorten your quarantine ISOLATION keeps someone who is sick or tested positive for COVID-19 without symptoms away from others, even in their own home. People who are in isolation should stay home and stay in a specific "sick room" or area and use a separate bathroom (if available). If you are sick and think or know you have COVID-19 Stay home until after  At least 10 days since symptoms first appeared and  At least 24 hours with no fever without the use of fever-reducing medications and  Symptoms have improved If you tested positive for COVID-19 but do not have symptoms  Stay home until after 10 days have passed since your positive viral test  If you develop symptoms after testing positive, follow the steps above for those who are sick [email or web address] 07/26/2020 This information is not intended to replace advice given to you by your health care provider. Make sure you discuss any questions you have with your health care provider. Document Revised: 08/30/2020 Document Reviewed:  08/30/2020 Elsevier Patient Education  2021 Elsevier Inc. Pharyngitis  Pharyngitis is a sore throat (pharynx). This is when there is redness, pain, and swelling in your throat. Most of the time, this condition gets better on its own. In some cases, you may need medicine. Follow these instructions at home:  Take over-the-counter and prescription medicines only as told by your doctor. ? If you were prescribed an antibiotic medicine, take it as told by your doctor. Do not stop taking the antibiotic even if you start to feel better. ? Do not give children aspirin. Aspirin has been linked to Reye syndrome.  Drink enough water and fluids to keep your pee (urine) clear or pale yellow.  Get a lot of rest.  Rinse your mouth (gargle) with a salt-water mixture 3-4 times a day or as needed. To make a salt-water mixture, completely dissolve -1 tsp of salt in 1 cup of warm water.  If your doctor approves, you may use throat lozenges or sprays to soothe your throat. Contact a doctor if:  You have large, tender lumps in your neck.  You have a rash.  You cough up green, yellow-brown, or bloody spit. Get help right away if:  You have a stiff neck.  You drool or cannot swallow liquids.  You cannot drink or take medicines without throwing up.  You have very bad pain that does not go away with medicine.  You have problems breathing, and it is not from a stuffy nose.  You have new pain and swelling in your knees, ankles, wrists, or  elbows. Summary  Pharyngitis is a sore throat (pharynx). This is when there is redness, pain, and swelling in your throat.  If you were prescribed an antibiotic medicine, take it as told by your doctor. Do not stop taking the antibiotic even if you start to feel better.  Most of the time, pharyngitis gets better on its own. Sometimes, you may need medicine. This information is not intended to replace advice given to you by your health care provider. Make sure you  discuss any questions you have with your health care provider. Document Revised: 09/28/2017 Document Reviewed: 11/21/2016 Elsevier Patient Education  2021 [employer].

## 2020-12-10 NOTE — Progress Notes (Signed)
MyChart Video Visit    Virtual Visit via Video Note   This visit type was conducted due to national recommendations for restrictions regarding the COVID-19 Pandemic (e.g. social distancing) in an effort to limit this patient's exposure and mitigate transmission in our community. This patient is at least at moderate risk for complications without adequate follow up. This format is felt to be most appropriate for this patient at this time. Physical exam was limited by quality of the video and audio technology used for the visit.  Parties involved in visit as below:    Patient location: my office  Provider location: at home   I discussed the limitations of evaluation and management by telemedicine and the availability of in person appointments. The patient expressed understanding and agreed to proceed.  Patient: Victoria Davila   DOB: 1956-01-21   65 y.o. Female  MRN: [id number] Visit Date: 12/10/2020  Today's healthcare provider: Jairo Ben, FNP   Chief Complaint  Patient presents with  . Covid Positive   Subjective    Sinus Problem This is a new problem. The current episode started 1 to 4 weeks ago. The maximum temperature recorded prior to her arrival was 100.4 - 100.9 F. Associated symptoms include chills, coughing, headaches, a hoarse voice, shortness of breath (chronic no change from chronic status. ), sinus pressure and sneezing. Pertinent negatives include no congestion, diaphoresis, ear pain, neck pain, sore throat or swollen glands. Past treatments include acetaminophen. The treatment provided mild relief.    She tested positive for covid this morning 12/10/2020.  She has sinus pressure, facial pressure, cheek pain and headache.  Denies any chest pian, chests congestion, denies any new shortness of breath - she has same as always chronic.Marland Kitchen History of mitral valve stenosis.  Patient  denies any fever, body aches,chills, rash, chest pain, shortness of  breath, nausea, vomiting, or diarrhea.   Denies dizziness, lightheadedness, pre syncopal or syncopal episodes.    Patient Active Problem List   Diagnosis Date Noted  . Sore throat 12/10/2020  . COVID-19 12/10/2020  . Acute non-recurrent pansinusitis 12/10/2020  . Delayed surgical wound healing 01/03/2018  . 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 Cervical spine arthritis 11/09/2017  . H/O maze procedure 11/09/2017  . H/O tricuspid valve repair 11/09/2017  . H/O mitral valve replacement with mechanical valve 11/09/2017  . History of Coumadin therapy 11/06/2017  . Chronic diastolic heart failure, NYHA class 2 (HCC) 08/30/2017  . Chronic bilateral low back pain 10/13/2016  . Recurrent oral herpes simplex 04/30/2015  . Arthritis 04/15/2015  . Gonalgia 04/15/2015  . Cervical polyp 04/15/2015  . Encounter for screening for diabetes mellitus 04/15/2015  . Edema 04/15/2015  . Abnormal liver enzymes 04/15/2015  . Essential (primary) hypertension 04/15/2015  . H/O head injury 04/15/2015  . Hypercholesteremia 04/15/2015  . Mitral regurgitation and mitral stenosis 04/15/2015  . Morbid obesity with BMI of 40.0-44.9, adult (HCC) 04/15/2015  . AF (paroxysmal atrial fibrillation) (HCC) 04/15/2015  . RBBB (right bundle branch block) 04/15/2015  . Cerebral seizure (HCC) 04/15/2015  . 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 Urge incontinence 04/15/2015  . Avitaminosis D 04/15/2015  . Arthritis of right hip 11/06/2014  . 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 Breath shortness 07/28/2014   Past Medical History:  Diagnosis Date  . Dysrhythmia    A FIB / FOLLOWED BY DR Nolon Rod AT [facility]  . Edema of both legs    CHRONIC  .  Hyperlipidemia   . Hypertension   . Mitral valve disorder    "deformed"  . Neuromuscular disorder (HCC)   . Rheumatoid arthritis (HCC)    RECENT DX - HAS NOT YET SEEN RHEUMATOLOGIST  . Seizures (HCC)    LAST SEIZURE JAN 1995  . Shortness of  breath dyspnea    WITH EXERTION   Past Surgical History:  Procedure Laterality Date  . ABDOMINAL HYSTERECTOMY  2012  . CARDIAC ELECTROPHYSIOLOGY MAPPING AND ABLATION    . CARDIAC VALVE SURGERY  2001   "heart valve stretched" for mitral valve stenosis  . CERVICAL POLYPECTOMY    . KNEE ARTHROSCOPY  2012   left  . TOTAL HIP ARTHROPLASTY Left 11/06/2014   Procedure: LEFT TOTAL HIP ARTHROPLASTY ANTERIOR APPROACH;  Surgeon: Kathryne Hitch, MD;  Location: [facility];  Service: Orthopedics;  Laterality: Left;   Social History   Tobacco Use  . Smoking status: Never Smoker  . Smokeless tobacco: Never Used  Vaping Use  . Vaping Use: Never used  Substance Use Topics  . Alcohol use: No    Comment: OCCASIONAL WINE ONLY  . Drug use: No   Social History   Socioeconomic History  . Marital status: Married    Spouse name: Not on file  . Number of children: Not on file  . Years of education: Not on file  . Highest education level: Not on file  Occupational History  . Not on file  Tobacco Use  . Smoking status: Never Smoker  . Smokeless tobacco: Never Used  Vaping Use  . Vaping Use: Never used  Substance and Sexual Activity  . Alcohol use: No    Comment: OCCASIONAL WINE ONLY  . Drug use: No  . Sexual activity: Not on file  Other Topics Concern  . Not on file  Social History Narrative  . Not on file   Social Determinants of Health   Financial Resource Strain: Not on file  Food Insecurity: Not on file  Transportation Needs: Not on file  Physical Activity: Not on file  Stress: Not on file  Social Connections: Not on file  Intimate Partner Violence: Not on file   Family Status  Relation Name Status  . Mother  Deceased at age 10       A  MI, ERSD/Dialysis  . Father  Deceased at age 33       arrhythmia  . Sister  Alive  . PGF  Alive  . MGF  Deceased at age 19's       MI  . PGM  Deceased       Natural Causes  . Sister  Alive   Family History  Problem Relation Age of  Onset  . Diabetes Mother   . Hip fracture Mother   . Arthritis Father   . Dementia Father   . Cancer Sister        Thyroid Cancer s/p Thyroidectomy  . Lung disease Paternal Grandfather    No Known Allergies    Medications: Outpatient Medications Prior to Visit  Medication Sig  . acetaminophen (TYLENOL) 500 MG tablet Take 1,000 mg by mouth every 6 (six) hours as needed for mild pain or moderate pain.  Marland Kitchen aspirin 81 MG chewable tablet Chew 81 mg by mouth daily.  . clotrimazole (CLOTRIMAZOLE ANTI-FUNGAL) 1 % cream Apply 1 application topically 2 (two) times daily.  Marland Kitchen DILANTIN 100 MG ER capsule TAKE FOUR CAPSULES DAILY  . metoprolol succinate (TOPROL-XL) 25 MG 24 hr tablet  Take 25 mg by mouth daily.  . Multiple Vitamin (MULTIVITAMIN WITH MINERALS) TABS tablet Take 1 tablet by mouth at bedtime.  . Omega-3 Fatty Acids (FISH OIL) 1200 MG CAPS Take 1 capsule by mouth 2 (two) times daily.   . potassium chloride (K-DUR) 10 MEQ tablet Take 3 tablets (30 mEq total) by mouth 2 (two) times daily.  . psyllium (REGULOID) 0.52 G capsule Take 0.52 g by mouth 2 (two) times daily.  Marland Kitchen torsemide (DEMADEX) 20 MG tablet Take 2 tablets (40 mg total) by mouth 2 (two) times daily.  . valACYclovir (VALTREX) 1000 MG tablet Take 1 tablet (1,000 mg total) by mouth 2 (two) times daily. For one day.  . warfarin (COUMADIN) 3 MG tablet Take 2 tablets (6 mg total) by mouth daily.  Marland Kitchen warfarin (COUMADIN) 4 MG tablet TAKE ONE TABLET BY MOUTH EVERY DAY WITH A 3MG  TABLET  . warfarin (COUMADIN) 5 MG tablet Take 1 tablet (5 mg total) by mouth daily.  warfarin (COUMADIN) 6 MG tablet Take 1 tablet (6 mg total) by mouth daily.  . [DISCONTINUED] amoxicillin (AMOXIL) 500 MG tablet Take 4 tablets by mouth 1 hour before procedure   No facility-administered medications prior to visit.    Review of Systems  Constitutional: Positive for chills. Negative for diaphoresis.  HENT: Positive for hoarse voice, sinus pressure and  sneezing. Negative for congestion, ear pain and sore throat.   Respiratory: Positive for cough and shortness of breath (chronic no change from chronic status. ).   Musculoskeletal: Negative for neck pain.  Neurological: Positive for headaches.    Last CBC Lab Results  Component Value Date   WBC 5.1 12/25/2019   HGB 14.9 12/25/2019   HCT 43.5 12/25/2019   MCV 91 12/25/2019   MCH 31.1 12/25/2019   RDW 14.0 12/25/2019   PLT 179 12/25/2019   Last metabolic panel Lab Results  Component Value Date   GLUCOSE 92 12/25/2019   NA 142 12/25/2019   K 4.3 12/25/2019   CL 101 12/25/2019   CO2 25 12/25/2019   BUN 17 12/25/2019   CREATININE 0.92 12/25/2019   GFRNONAA 66 12/25/2019   GFRAA 76 12/25/2019   CALCIUM 9.0 12/25/2019   PHOS 3.8 01/22/2018   PROT 7.1 12/25/2019   ALBUMIN 4.3 12/25/2019   LABGLOB 2.8 12/25/2019   AGRATIO 1.5 12/25/2019   BILITOT 0.4 12/25/2019   ALKPHOS 170 (H) 12/25/2019   AST 33 12/25/2019   ALT 28 12/25/2019   ANIONGAP 11 11/07/2014   Last lipids Lab Results  Component Value Date   CHOL 267 (H) 12/25/2019   HDL 78 12/25/2019   LDLCALC 169 (H) 12/25/2019   TRIG 116 12/25/2019   CHOLHDL 3.4 12/25/2019   Last hemoglobin A1c Lab Results  Component Value Date   HGBA1C 5.6 03/01/2016   Last thyroid functions Lab Results  Component Value Date   TSH 2.470 12/25/2019   Last vitamin D Lab Results  Component Value Date   VD25OH 35.7 03/01/2016   Last vitamin B12 and Folate No results found for: VITAMINB12, FOLATE    Objective    There were no vitals taken for this visit. BP Readings from Last 3 Encounters:  09/29/20 118/88  08/03/20 139/80  06/15/20 128/83   Wt Readings from Last 3 Encounters:  09/29/20 251 lb (113.9 kg)  08/03/20 247 lb (112 kg)  06/15/20 246 lb 3.2 oz (111.7 kg)      Physical Exam     Patient is alert  and oriented and responsive to questions Engages in conversation with provider. Speaks in full sentences  without any pauses without any shortness of breath or distress.    Assessment & Plan     Acute non-recurrent pansinusitis  Mitral stenosis with insufficiency, unspecified etiology  COVID-19  Sore throat   Meds ordered this encounter  Medications  . amoxicillin-clavulanate (AUGMENTIN) 875-125 MG tablet    Sig: Take 1 tablet by mouth 2 (two) times daily.    Dispense:  20 tablet    Refill:  0  will give coverage for bacterial infection as well given recent dental procedure and history of mechanical valve.  I discussed the limitations of evaluation and management by telemedicine and the availability of in person appointments. The patient expressed understanding and agreed to proceed.   Return in about 3 days (around 12/13/2020), or if symptoms worsen or fail to improve, for at any time for any worsening symptoms, Go to Emergency room/ urgent care if worse.     I discussed the assessment and treatment plan with the patient. The patient was provided an opportunity to ask questions and all were answered. The patient agreed with the plan and demonstrated an understanding of the instructions.   The patient was advised to call back or seek an in-person evaluation if the symptoms worsen or if the condition fails to improve as anticipated.  I provided 30 minutes of non-face-to-face time during this encounter.  The entirety of the information documented in the History of Present Illness, Review of Systems and Physical Exam were personally obtained by me. Portions of this information were initially documented by the CMA and reviewed by me for thoroughness and accuracy.     Jairo Ben, FNP [facility] [phone] (phone) [phone] (fax)  [facility]

## 2020-12-11 LAB — SARS-COV-2, NAA 2 DAY TAT

## 2020-12-11 LAB — NOVEL CORONAVIRUS, NAA: SARS-CoV-2, NAA: DETECTED — AB

## 2020-12-22 ENCOUNTER — Other Ambulatory Visit: Payer: Self-pay

## 2020-12-22 ENCOUNTER — Ambulatory Visit (INDEPENDENT_AMBULATORY_CARE_PROVIDER_SITE_OTHER): Payer: Medicare PPO

## 2020-12-22 DIAGNOSIS — I050 Rheumatic mitral stenosis: Secondary | ICD-10-CM | POA: Diagnosis not present

## 2020-12-22 LAB — POCT INR
INR: 1.5 — AB (ref 2.0–3.0)
PT: 17.8

## 2020-12-22 NOTE — Patient Instructions (Signed)
Description   6 mg daily, f/u 2 weeks

## 2021-01-05 ENCOUNTER — Ambulatory Visit: Payer: Medicare PPO

## 2021-01-05 ENCOUNTER — Other Ambulatory Visit: Payer: Self-pay

## 2021-01-05 DIAGNOSIS — I052 Rheumatic mitral stenosis with insufficiency: Secondary | ICD-10-CM

## 2021-01-05 DIAGNOSIS — G40909 Epilepsy, unspecified, not intractable, without status epilepticus: Secondary | ICD-10-CM

## 2021-01-05 DIAGNOSIS — I050 Rheumatic mitral stenosis: Secondary | ICD-10-CM | POA: Diagnosis not present

## 2021-01-05 LAB — POCT INR
INR: 2.6 (ref 2.0–3.0)
PT: 31.8

## 2021-01-05 MED ORDER — DILANTIN 100 MG PO CAPS: ORAL_CAPSULE | ORAL | 5 refills | Status: DC

## 2021-01-10 ENCOUNTER — Telehealth: Payer: Self-pay

## 2021-01-10 NOTE — Telephone Encounter (Signed)
Patient needing a call back to find out if she will need to fast for her appointment on April 27th.  Please cal patient back at [phone].  Thanks, [employer]

## 2021-01-10 NOTE — Telephone Encounter (Signed)
Advised patient that she does not have to fast.

## 2021-01-26 ENCOUNTER — Ambulatory Visit: Payer: Medicare PPO | Admitting: *Deleted

## 2021-01-26 ENCOUNTER — Other Ambulatory Visit: Payer: Self-pay

## 2021-01-26 DIAGNOSIS — I052 Rheumatic mitral stenosis with insufficiency: Secondary | ICD-10-CM | POA: Diagnosis not present

## 2021-01-26 DIAGNOSIS — I050 Rheumatic mitral stenosis: Secondary | ICD-10-CM | POA: Diagnosis not present

## 2021-01-26 LAB — POCT INR
INR: 3.4 — AB (ref 2.0–3.0)
PT: 40.9

## 2021-01-26 NOTE — Patient Instructions (Addendum)
Due to bruising, hold today and tomorrow's dose. Than resume 6 mg daily. Follow-up in 4 weeks.

## 2021-01-31 ENCOUNTER — Ambulatory Visit: Payer: Medicare PPO | Admitting: Family Medicine

## 2021-02-17 DIAGNOSIS — D2271 Melanocytic nevi of right lower limb, including hip: Secondary | ICD-10-CM | POA: Diagnosis not present

## 2021-02-17 DIAGNOSIS — D2261 Melanocytic nevi of right upper limb, including shoulder: Secondary | ICD-10-CM | POA: Diagnosis not present

## 2021-02-17 DIAGNOSIS — D225 Melanocytic nevi of trunk: Secondary | ICD-10-CM | POA: Diagnosis not present

## 2021-02-17 DIAGNOSIS — D2272 Melanocytic nevi of left lower limb, including hip: Secondary | ICD-10-CM | POA: Diagnosis not present

## 2021-02-17 DIAGNOSIS — D2262 Melanocytic nevi of left upper limb, including shoulder: Secondary | ICD-10-CM | POA: Diagnosis not present

## 2021-02-17 DIAGNOSIS — L821 Other seborrheic keratosis: Secondary | ICD-10-CM | POA: Diagnosis not present

## 2021-02-23 ENCOUNTER — Other Ambulatory Visit: Payer: Self-pay

## 2021-02-23 ENCOUNTER — Ambulatory Visit: Payer: Medicare PPO | Admitting: Family Medicine

## 2021-02-23 ENCOUNTER — Encounter: Payer: Self-pay | Admitting: Family Medicine

## 2021-02-23 VITALS — BP 129/87 | HR 94 | Temp 98.6°F | Resp 18 | Ht 63.0 in | Wt 248.0 lb

## 2021-02-23 DIAGNOSIS — I052 Rheumatic mitral stenosis with insufficiency: Secondary | ICD-10-CM

## 2021-02-23 DIAGNOSIS — I050 Rheumatic mitral stenosis: Secondary | ICD-10-CM | POA: Diagnosis not present

## 2021-02-23 DIAGNOSIS — I10 Essential (primary) hypertension: Secondary | ICD-10-CM

## 2021-02-23 DIAGNOSIS — G40909 Epilepsy, unspecified, not intractable, without status epilepticus: Secondary | ICD-10-CM | POA: Diagnosis not present

## 2021-02-23 DIAGNOSIS — E7800 Pure hypercholesterolemia, unspecified: Secondary | ICD-10-CM | POA: Diagnosis not present

## 2021-02-23 DIAGNOSIS — Z6841 Body Mass Index (BMI) 40.0 and over, adult: Secondary | ICD-10-CM | POA: Diagnosis not present

## 2021-02-23 LAB — POCT INR
INR: 4 — AB (ref 2.0–3.0)
PT: 48.3

## 2021-02-23 NOTE — Patient Instructions (Addendum)
Hold dose today and tomorrow. Than alternate 6 mg and 5 mg daily, starting 02/25/2021 with 5 mg daily. Recheck in 2 weeks.

## 2021-02-23 NOTE — Progress Notes (Signed)
I,April Miller,acting as a scribe for Megan Mans, MD.,have documented all relevant documentation on the behalf of Megan Mans, MD,as directed by  Megan Mans, MD while in the presence of Megan Mans, MD.   Established patient visit   Patient: Victoria Davila   DOB: 1956-01-27   65 y.o. Female  MRN: [id number] Visit Date: 02/23/2021  Today's healthcare provider: Megan Mans, MD   Chief Complaint  Patient presents with  . Follow-up  . Hypertension   Subjective    HPI  Patient comes in today for follow-up.  She gets around slowly but is doing fairly well.  She has no new issues and no seizure activity.  She is taking medications as prescribed. Hypertension, follow-up  BP Readings from Last 3 Encounters:  02/23/21 129/87  09/29/20 118/88  08/03/20 139/80   Wt Readings from Last 3 Encounters:  02/23/21 248 lb (112.5 kg)  09/29/20 251 lb (113.9 kg)  08/03/20 247 lb (112 kg)     She was last seen for hypertension 5 months ago.  BP at that visit was 139/80. Management since that visit includes; on metoprolol. She reports good compliance with treatment. She is not having side effects. none She is not exercising. She is adherent to low salt diet.   Outside blood pressures are; not checking.  She does not smoke.  Use of agents associated with hypertension: none.   --------------------------------------------------------------------  Cerebral seizure (HCC) From 09/29/2020- Try to get Dilantin level between 10 and 15.  Status post mitral valve replacement From 09/29/2020- Goal INR between 2.5 and 3.5.  Lab Results  Component Value Date   INR 4.0 (A) 02/23/2021   INR 3.4 (A) 01/26/2021   INR 2.6 01/05/2021         Medications: Outpatient Medications Prior to Visit  Medication Sig  . acetaminophen (TYLENOL) 500 MG tablet Take 1,000 mg by mouth every 6 (six) hours as needed for mild pain or moderate pain.  Marland Kitchen aspirin 81 MG  chewable tablet Chew 81 mg by mouth daily.  . clotrimazole (CLOTRIMAZOLE ANTI-FUNGAL) 1 % cream Apply 1 application topically 2 (two) times daily.  Marland Kitchen DILANTIN 100 MG ER capsule TAKE FOUR CAPSULES DAILY  . metoprolol succinate (TOPROL-XL) 25 MG 24 hr tablet Take 25 mg by mouth daily.  . Multiple Vitamin (MULTIVITAMIN WITH MINERALS) TABS tablet Take 1 tablet by mouth at bedtime.  . Omega-3 Fatty Acids (FISH OIL) 1200 MG CAPS Take 1 capsule by mouth 2 (two) times daily.   . potassium chloride (K-DUR) 10 MEQ tablet Take 3 tablets (30 mEq total) by mouth 2 (two) times daily.  . psyllium (REGULOID) 0.52 G capsule Take 0.52 g by mouth 2 (two) times daily.  Marland Kitchen torsemide (DEMADEX) 20 MG tablet Take 2 tablets (40 mg total) by mouth 2 (two) times daily.  . valACYclovir (VALTREX) 1000 MG tablet Take 1 tablet (1,000 mg total) by mouth 2 (two) times daily. For one day.  . warfarin (COUMADIN) 3 MG tablet Take 2 tablets (6 mg total) by mouth daily.  Marland Kitchen warfarin (COUMADIN) 4 MG tablet TAKE ONE TABLET BY MOUTH EVERY DAY WITH A 3MG  TABLET  . warfarin (COUMADIN) 5 MG tablet Take 1 tablet (5 mg total) by mouth daily.  Marland Kitchen warfarin (COUMADIN) 6 MG tablet Take 1 tablet (6 mg total) by mouth daily.  Marland Kitchen amoxicillin-clavulanate (AUGMENTIN) 875-125 MG tablet Take 1 tablet by mouth 2 (two) times daily. (Patient not taking: Reported on  02/23/2021)   No facility-administered medications prior to visit.    Review of Systems      Objective    BP 129/87 (BP Location: Left Arm, Patient Position: Sitting, Cuff Size: Large)   Pulse 94   Temp 98.6 F (37 C) (Oral)   Resp 18   Ht 5\' 3"  (1.6 m)   Wt 248 lb (112.5 kg)   SpO2 97%   BMI 43.93 kg/m  BP Readings from Last 3 Encounters:  02/23/21 129/87  09/29/20 118/88  08/03/20 139/80   Wt Readings from Last 3 Encounters:  02/23/21 248 lb (112.5 kg)  09/29/20 251 lb (113.9 kg)  08/03/20 247 lb (112 kg)       Physical Exam Vitals reviewed.  Constitutional:       General: She is not in acute distress.    Appearance: She is well-developed. She is obese.  HENT:     Head: Normocephalic and atraumatic.     Right Ear: Hearing, tympanic membrane and ear canal normal.     Left Ear: Hearing, tympanic membrane and ear canal normal.     Nose: Nose normal.     Mouth/Throat:     Mouth: Mucous membranes are moist.     Pharynx: Oropharynx is clear. Uvula midline.     Tonsils: No tonsillar exudate or tonsillar abscesses.  Eyes:     General: Lids are normal. No scleral icterus.       Right eye: No discharge.        Left eye: No discharge.     Conjunctiva/sclera: Conjunctivae normal.  Cardiovascular:     Rate and Rhythm: Normal rate and regular rhythm.     Heart sounds: Normal heart sounds.  Pulmonary:     Effort: Pulmonary effort is normal. No respiratory distress.     Breath sounds: Normal breath sounds.  Musculoskeletal:     Cervical back: Normal range of motion and neck supple.     Comments: Chronic lymphedema both lower extremities.   Lymphadenopathy:     Cervical: No cervical adenopathy.  Skin:    General: Skin is warm and dry.     Findings: No lesion or rash.  Neurological:     General: No focal deficit present.     Mental Status: She is alert and oriented to person, place, and time.  Psychiatric:        Mood and Affect: Mood normal.        Speech: Speech normal.        Behavior: Behavior normal.        Thought Content: Thought content normal.        Judgment: Judgment normal.       Results for orders placed or performed in visit on 02/23/21  POCT INR  Result Value Ref Range   INR 4.0 (A) 2.0 - 3.0   PT 48.3     Assessment & Plan     1. Mitral stenosis with insufficiency, unspecified etiology Cardiology - Lipid panel - TSH - CBC w/Diff/Platelet - Comprehensive Metabolic Panel (CMET) - Phenytoin level, total  2.obesity   3. Cerebral seizure (HCC) Phenytoin level - Lipid panel - TSH - CBC w/Diff/Platelet -  Comprehensive Metabolic Panel (CMET) - Phenytoin level, total  4. Essential (primary) hypertension Good control. - Lipid panel - TSH - CBC w/Diff/Platelet - Comprehensive Metabolic Panel (CMET) - Phenytoin level, total  5. Hypercholesteremia  - Lipid panel - TSH - CBC w/Diff/Platelet - Comprehensive Metabolic Panel (CMET) - Phenytoin level,  total   No follow-ups on file.      I, Megan Mans, MD, have reviewed all documentation for this visit. The documentation on 02/27/21 for the exam, diagnosis, procedures, and orders are all accurate and complete.    Camaria Gerald Wendelyn Breslow, MD  [facility] [phone] (phone) [phone] (fax)  [facility]

## 2021-02-25 DIAGNOSIS — I10 Essential (primary) hypertension: Secondary | ICD-10-CM | POA: Diagnosis not present

## 2021-02-25 DIAGNOSIS — E7800 Pure hypercholesterolemia, unspecified: Secondary | ICD-10-CM | POA: Diagnosis not present

## 2021-02-25 DIAGNOSIS — I052 Rheumatic mitral stenosis with insufficiency: Secondary | ICD-10-CM | POA: Diagnosis not present

## 2021-02-25 DIAGNOSIS — I050 Rheumatic mitral stenosis: Secondary | ICD-10-CM | POA: Diagnosis not present

## 2021-02-25 DIAGNOSIS — G40909 Epilepsy, unspecified, not intractable, without status epilepticus: Secondary | ICD-10-CM | POA: Diagnosis not present

## 2021-02-26 LAB — CBC WITH DIFFERENTIAL/PLATELET
Basophils Absolute: 0.1 10*3/uL (ref 0.0–0.2)
Basos: 1 %
EOS (ABSOLUTE): 0.2 10*3/uL (ref 0.0–0.4)
Eos: 3 %
Hematocrit: 41.3 % (ref 34.0–46.6)
Hemoglobin: 14.5 g/dL (ref 11.1–15.9)
Immature Grans (Abs): 0 10*3/uL (ref 0.0–0.1)
Immature Granulocytes: 0 %
Lymphocytes Absolute: 1.6 10*3/uL (ref 0.7–3.1)
Lymphs: 28 %
MCH: 31.5 pg (ref 26.6–33.0)
MCHC: 35.1 g/dL (ref 31.5–35.7)
MCV: 90 fL (ref 79–97)
Monocytes Absolute: 0.6 10*3/uL (ref 0.1–0.9)
Monocytes: 10 %
Neutrophils Absolute: 3.5 10*3/uL (ref 1.4–7.0)
Neutrophils: 58 %
Platelets: 159 10*3/uL (ref 150–450)
RBC: 4.6 x10E6/uL (ref 3.77–5.28)
RDW: 14 % (ref 11.7–15.4)
WBC: 5.9 10*3/uL (ref 3.4–10.8)

## 2021-02-26 LAB — COMPREHENSIVE METABOLIC PANEL
ALT: 20 IU/L (ref 0–32)
AST: 25 IU/L (ref 0–40)
Albumin/Globulin Ratio: 1.6 (ref 1.2–2.2)
Albumin: 4.4 g/dL (ref 3.8–4.8)
Alkaline Phosphatase: 140 IU/L — ABNORMAL HIGH (ref 44–121)
BUN/Creatinine Ratio: 20 (ref 12–28)
BUN: 19 mg/dL (ref 8–27)
Bilirubin Total: 0.4 mg/dL (ref 0.0–1.2)
CO2: 27 mmol/L (ref 20–29)
Calcium: 8.7 mg/dL (ref 8.7–10.3)
Chloride: 101 mmol/L (ref 96–106)
Creatinine, Ser: 0.95 mg/dL (ref 0.57–1.00)
Globulin, Total: 2.7 g/dL (ref 1.5–4.5)
Glucose: 92 mg/dL (ref 65–99)
Potassium: 3.9 mmol/L (ref 3.5–5.2)
Sodium: 145 mmol/L — ABNORMAL HIGH (ref 134–144)
Total Protein: 7.1 g/dL (ref 6.0–8.5)
eGFR: 66 mL/min/{1.73_m2} (ref >59)

## 2021-02-26 LAB — LIPID PANEL
Chol/HDL Ratio: 3.6 ratio (ref 0.0–4.4)
Cholesterol, Total: 293 mg/dL — ABNORMAL HIGH (ref 100–199)
HDL: 82 mg/dL (ref >39)
LDL Chol Calc (NIH): 186 mg/dL — ABNORMAL HIGH (ref 0–99)
Triglycerides: 143 mg/dL (ref 0–149)
VLDL Cholesterol Cal: 25 mg/dL (ref 5–40)

## 2021-02-26 LAB — TSH: TSH: 3.95 u[IU]/mL (ref 0.450–4.500)

## 2021-02-26 LAB — PHENYTOIN LEVEL, TOTAL: Phenytoin (Dilantin), Serum: 13.9 ug/mL (ref 10.0–20.0)

## 2021-02-28 ENCOUNTER — Telehealth: Payer: Self-pay

## 2021-02-28 MED ORDER — AMOXICILLIN-POT CLAVULANATE 875-125 MG PO TABS: 1.0000 | ORAL_TABLET | Freq: Two times a day (BID) | ORAL | 0 refills | Status: AC

## 2021-02-28 NOTE — Telephone Encounter (Signed)
Augmentin 875 BID for 5 days if not PCN allergic

## 2021-02-28 NOTE — Telephone Encounter (Signed)
Copied from CRM [id number]. Topic: General - Other >> Feb 28, 2021  8:05 AM Jaquita Rector A wrote: Reason for CRM: Patient husband called in to inquire of Dr Sullivan Lone say that patient have been running a fever since 02/26/21 and also have some sinus congestion he checked her for Covid but they are all negative. Say that patient takes Tylenol and Mucinex which help a little but due to her heart condition they are concerned asking Dr Sullivan Lone for some help. Please call  Ph# [phone]

## 2021-02-28 NOTE — Telephone Encounter (Signed)
Advised patient as below.  

## 2021-03-02 ENCOUNTER — Telehealth: Payer: Self-pay

## 2021-03-02 ENCOUNTER — Telehealth: Payer: Self-pay | Admitting: Family Medicine

## 2021-03-02 MED ORDER — ROSUVASTATIN CALCIUM 10 MG PO TABS: 10.0000 mg | ORAL_TABLET | Freq: Every day | ORAL | 3 refills | Status: DC

## 2021-03-02 NOTE — Telephone Encounter (Signed)
-----   Message from Maple Hudson., MD sent at 03/02/2021  1:41 PM EDT ----- Labs in normal range but cholesterol too high.  Start rosuvastatin 10 mg daily.  Follow-up in a couple of months.

## 2021-03-02 NOTE — Telephone Encounter (Signed)
Review of chart- Do not see PCP comments on most recent lab results- sent for review and recommendations

## 2021-03-02 NOTE — Telephone Encounter (Signed)
Caller name: Zeiter,Richard K Relation to pt: spouse / patient  Call back number: [phone]  Reason for call:  Caller (spouse) and patient would like to discuss patient most recent cholesterol results in comparison to past results. Patient and caller noticed it was significantly higher this time.

## 2021-03-02 NOTE — Telephone Encounter (Signed)
Patient has been advised. KW 

## 2021-03-02 NOTE — Telephone Encounter (Signed)
Please advise results? 

## 2021-03-09 ENCOUNTER — Ambulatory Visit: Payer: Medicare PPO

## 2021-03-09 ENCOUNTER — Other Ambulatory Visit: Payer: Self-pay

## 2021-03-09 DIAGNOSIS — I052 Rheumatic mitral stenosis with insufficiency: Secondary | ICD-10-CM | POA: Diagnosis not present

## 2021-03-09 DIAGNOSIS — I050 Rheumatic mitral stenosis: Secondary | ICD-10-CM | POA: Diagnosis not present

## 2021-03-09 LAB — POCT INR
INR: 2.7 (ref 2.0–3.0)
PT: 31.8

## 2021-03-09 NOTE — Patient Instructions (Signed)
Continue to Alternate 5mg  and 6mg .  Recheck in four weeks.

## 2021-03-14 ENCOUNTER — Other Ambulatory Visit: Payer: Self-pay | Admitting: Family Medicine

## 2021-03-14 DIAGNOSIS — I050 Rheumatic mitral stenosis: Secondary | ICD-10-CM

## 2021-03-14 NOTE — Telephone Encounter (Signed)
Requested medication (s) are due for refill today: yes  Requested medication (s) are on the active medication list: yes  Last refill:  11/12/20  Future visit scheduled: yes  Notes to clinic:  not delegated    Requested Prescriptions  Pending Prescriptions Disp Refills   warfarin (COUMADIN) 5 MG tablet [Pharmacy Med Name: WARFARIN SODIUM 5 MG TAB] 30 tablet 3    Sig: TAKE ONE TABLET EVERY DAY      Hematology:  Anticoagulants - warfarin Failed - 03/14/2021 10:46 AM      Failed - This refill cannot be delegated      Failed - If the patient is managed by Coumadin Clinic - route to their Pool. If not, forward to the provider.      Passed - INR in normal range and within 30 days    INR  Date Value Ref Range Status  03/09/2021 2.7 2.0 - 3.0 Final    Comment:    Continue to Alternate 5mg  and 6mg .  Recheck in four weeks.   01/22/2018 4.3 (H) 0.8 - 1.2 Final    Comment:    Reference interval is for non-anticoagulated patients. Suggested INR therapeutic range for Vitamin K antagonist therapy:    Standard Dose (moderate intensity                   therapeutic range):       2.0 - 3.0    Higher intensity therapeutic range       2.5 - 3.5   09/25/2014 2.0  Final    Comment:    INR reference interval applies to patients on anticoagulant therapy. A single INR therapeutic range for coumarins is not optimal for all indications; however, the suggested range for most indications is 2.0 - 3.0. Exceptions to the INR Reference Range may include: Prosthetic heart valves, acute myocardial infarction, prevention of myocardial infarction, and combinations of aspirin and anticoagulant. The need for a higher or lower target INR must be assessed individually. Reference: The Pharmacology and Management of the Vitamin K  antagonists: the seventh ACCP Conference on Antithrombotic and Thrombolytic Therapy. Chest.2004 Sept:126 (3suppl): 01/24/2018. A HCT value >55% may artifactually increase the PT.  In  one study,  the increase was an average of 25%. Reference:  "Effect on Routine and Special Coagulation Testing Values of Citrate Anticoagulant Adjustment in Patients with High HCT Values." American Journal of Clinical Pathology 2006;126:400-405.           Passed - Valid encounter within last 3 months    Recent Outpatient Visits           2 weeks ago Mitral stenosis with insufficiency, unspecified etiology   [facility] [id number]., MD   3 months ago Acute non-recurrent pansinusitis   [facility] [place], [facility], FNP   5 months ago Cerebral seizure [facility])   [facility] MD   7 months ago Vaginal itching   [facility] [place]., MD   9 months ago Cerebral seizure [facility])   [facility] MD       Future Appointments             In 2 months [facility] MD [facility]   In 5 months Maple Hudson., MD [facility]

## 2021-04-06 ENCOUNTER — Ambulatory Visit: Payer: Medicare PPO

## 2021-04-06 ENCOUNTER — Other Ambulatory Visit: Payer: Self-pay

## 2021-04-06 DIAGNOSIS — I050 Rheumatic mitral stenosis: Secondary | ICD-10-CM

## 2021-04-06 LAB — POCT INR
INR: 4.3 — AB (ref 2.0–3.0)
PT: 52

## 2021-04-06 NOTE — Patient Instructions (Signed)
Description   6 mg daily except 5 mg M-W-F F/U 2 weeks

## 2021-04-20 ENCOUNTER — Ambulatory Visit: Payer: Medicare PPO

## 2021-04-20 ENCOUNTER — Other Ambulatory Visit: Payer: Self-pay

## 2021-04-20 DIAGNOSIS — I050 Rheumatic mitral stenosis: Secondary | ICD-10-CM

## 2021-04-20 LAB — POCT INR
INR: 3.8 — AB (ref 2.0–3.0)
PT: 45

## 2021-04-20 NOTE — Patient Instructions (Signed)
Description   5 mg daily except 5 mg M-W-F F/U 2 weeks

## 2021-05-04 ENCOUNTER — Other Ambulatory Visit: Payer: Self-pay

## 2021-05-04 ENCOUNTER — Ambulatory Visit (INDEPENDENT_AMBULATORY_CARE_PROVIDER_SITE_OTHER): Payer: Medicare PPO

## 2021-05-04 DIAGNOSIS — I052 Rheumatic mitral stenosis with insufficiency: Secondary | ICD-10-CM

## 2021-05-04 DIAGNOSIS — I050 Rheumatic mitral stenosis: Secondary | ICD-10-CM | POA: Diagnosis not present

## 2021-05-04 LAB — POCT INR
INR: 3.5 — AB (ref 2.0–3.0)
PT: 41.6

## 2021-05-04 NOTE — Patient Instructions (Signed)
Continue 5mg  daily execpt 6mg  M, W, F.  Recheck in three weeks.

## 2021-05-11 ENCOUNTER — Ambulatory Visit: Payer: Self-pay

## 2021-05-17 ENCOUNTER — Other Ambulatory Visit: Payer: Self-pay

## 2021-05-17 ENCOUNTER — Ambulatory Visit: Payer: Self-pay | Attending: Internal Medicine

## 2021-05-17 DIAGNOSIS — Z23 Encounter for immunization: Secondary | ICD-10-CM

## 2021-05-17 MED ORDER — COVID-19 MRNA VAC-TRIS(PFIZER) 30 MCG/0.3ML IM SUSP
INTRAMUSCULAR | 0 refills | Status: DC
  Filled 2021-05-17: qty 0.3, 30d supply, fill #0

## 2021-05-18 DIAGNOSIS — H2513 Age-related nuclear cataract, bilateral: Secondary | ICD-10-CM | POA: Diagnosis not present

## 2021-05-18 DIAGNOSIS — Z0100 Encounter for examination of eyes and vision without abnormal findings: Secondary | ICD-10-CM | POA: Diagnosis not present

## 2021-05-19 ENCOUNTER — Ambulatory Visit: Payer: Self-pay | Admitting: *Deleted

## 2021-05-19 DIAGNOSIS — J019 Acute sinusitis, unspecified: Secondary | ICD-10-CM

## 2021-05-19 MED ORDER — AMOXICILLIN-POT CLAVULANATE 875-125 MG PO TABS: 1.0000 | ORAL_TABLET | Freq: Two times a day (BID) | ORAL | 0 refills | Status: DC

## 2021-05-19 NOTE — Telephone Encounter (Signed)
I am willing to send in Augmentin but I think the fever is more than likely a reaction to the COVID-vaccine.  Due to her Coumadin any antibiotic can cause her Coumadin level to be affected.  We get sent in the Augmentin prescription but I would not recommend they get it unless she continues to run a fever and get sicker over the next day or 2.  Again, it is not unusual for people to feel bad and get fever after COVID vaccines.

## 2021-05-19 NOTE — Telephone Encounter (Signed)
Please advise. Thanks.  

## 2021-05-19 NOTE — Addendum Note (Signed)
Addended by: Marlene Lard on: 05/19/2021 11:18 AM   Modules accepted: Orders

## 2021-05-19 NOTE — Telephone Encounter (Signed)
Reason for Disposition  [1] Fever > 101 F (38.3 C) AND [2] age > 60 years  Answer Assessment - Initial Assessment Questions 1. TEMPERATURE: "What is the most recent temperature?"  "How was it measured?"      Fever 101 last night.   I gave her Tylenol.   4:00 AM gave her more Tylenol.   99.5 now.   Had a sinus infection about a month or 2 ago.    She had 2nd Covid booster on Tues.   No reaction to shot.    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   Dr. Sullivan Lone called her in an antibiotic.      She has pressure around eyes and has a runny nose.   This morning she is feeling a little better.  Husband calling in.   She was having sinus issues before the Covid shot.    2. ONSET: "When did the fever start?"      Last night 3. CHILLS: "Do you have chills?" If yes: "How bad are they?"  (e.g., none, mild, moderate, severe)   - NONE: no chills   - MILD: feeling cold   - MODERATE: feeling very cold, some shivering (feels better under a thick blanket)   - SEVERE: feeling extremely cold with shaking chills (general body shaking, rigors; even under a thick blanket)      Chills 4. OTHER SYMPTOMS: "Do you have any other symptoms besides the fever?"  (e.g., abdomen pain, cough, diarrhea, earache, headache, sore throat, urination pain)     Fever 101 last night Tylenol given 5. CAUSE: If there are no symptoms, ask: "What do you think is causing the fever?"      I'm not sure  Maybe the Covid vaccine or a sinus infection 6. CONTACTS: "Does anyone else in the family have an infection?"     No 7. TREATMENT: "What have you done so far to treat this fever?" (e.g., medications)     Tylenol last night and again at 4:00 AM this morning 8. IMMUNOCOMPROMISE: "Do you have of the following: diabetes, HIV positive, splenectomy, cancer chemotherapy, chronic steroid treatment, transplant patient, etc."     Wife had a heart valve replacement so concerned about infection being present. 9. PREGNANCY: "Is there any chance you are  pregnant?" "When was your last menstrual period?"     N/A due to age 65. TRAVEL: "Have you traveled out of the country in the last month?" (e.g., travel history, exposures)       No  Protocols used: [facility]

## 2021-05-19 NOTE — Telephone Encounter (Signed)
Advised patient's husband Richard. Richard requested antibiotic anyway. Sent to pharmacy.

## 2021-05-19 NOTE — Telephone Encounter (Signed)
Pt's husband called in.  Wife running fever.  Maybe a sinus infection or a reaction to 2nd Covid booster given on Tues.   Was having sinus problems prior to getting booster.      See triage notes.   Husband requesting a phone appt to see if Dr. Sullivan Lone will call in an antibiotic.  I sent my notes to [facility] for Dr. Sullivan Lone.

## 2021-05-25 ENCOUNTER — Other Ambulatory Visit: Payer: Self-pay

## 2021-05-25 ENCOUNTER — Ambulatory Visit (INDEPENDENT_AMBULATORY_CARE_PROVIDER_SITE_OTHER): Payer: Medicare PPO

## 2021-05-25 DIAGNOSIS — I050 Rheumatic mitral stenosis: Secondary | ICD-10-CM | POA: Diagnosis not present

## 2021-05-25 LAB — POCT INR
INR: 3.7 — AB (ref 2.0–3.0)
PT: 44

## 2021-05-25 NOTE — Patient Instructions (Signed)
Description   Continue 5mg  daily execpt 6mg  M, W, F.  Recheck in 1 week.

## 2021-06-01 ENCOUNTER — Ambulatory Visit (INDEPENDENT_AMBULATORY_CARE_PROVIDER_SITE_OTHER): Payer: Medicare PPO

## 2021-06-01 ENCOUNTER — Other Ambulatory Visit: Payer: Self-pay

## 2021-06-01 DIAGNOSIS — I050 Rheumatic mitral stenosis: Secondary | ICD-10-CM | POA: Diagnosis not present

## 2021-06-01 DIAGNOSIS — I052 Rheumatic mitral stenosis with insufficiency: Secondary | ICD-10-CM | POA: Diagnosis not present

## 2021-06-01 LAB — POCT INR
INR: 4.5 — AB (ref 2.0–3.0)
PT: 54.4

## 2021-06-01 NOTE — Patient Instructions (Signed)
Description   Hold 2 days, then start 5mg  daily. Recheck in 2 weeks at F/U appt w/ Dr. .

## 2021-06-08 ENCOUNTER — Encounter: Payer: Self-pay | Admitting: Family Medicine

## 2021-06-08 ENCOUNTER — Ambulatory Visit (INDEPENDENT_AMBULATORY_CARE_PROVIDER_SITE_OTHER): Payer: Medicare PPO | Admitting: Family Medicine

## 2021-06-08 ENCOUNTER — Other Ambulatory Visit: Payer: Self-pay

## 2021-06-08 VITALS — BP 132/70 | HR 92 | Resp 18 | Ht 63.0 in | Wt 248.0 lb

## 2021-06-08 DIAGNOSIS — G40909 Epilepsy, unspecified, not intractable, without status epilepticus: Secondary | ICD-10-CM

## 2021-06-08 DIAGNOSIS — Z6841 Body Mass Index (BMI) 40.0 and over, adult: Secondary | ICD-10-CM

## 2021-06-08 DIAGNOSIS — I480 Paroxysmal atrial fibrillation: Secondary | ICD-10-CM | POA: Diagnosis not present

## 2021-06-08 DIAGNOSIS — I10 Essential (primary) hypertension: Secondary | ICD-10-CM

## 2021-06-08 DIAGNOSIS — I890 Lymphedema, not elsewhere classified: Secondary | ICD-10-CM | POA: Diagnosis not present

## 2021-06-08 DIAGNOSIS — I050 Rheumatic mitral stenosis: Secondary | ICD-10-CM

## 2021-06-08 DIAGNOSIS — E7800 Pure hypercholesterolemia, unspecified: Secondary | ICD-10-CM | POA: Diagnosis not present

## 2021-06-08 LAB — POCT INR
INR: 2.5 (ref 2.0–3.0)
PT: 30.5

## 2021-06-08 NOTE — Progress Notes (Signed)
I,April Miller,acting as a scribe for Megan Mans, MD.,have documented all relevant documentation on the behalf of Megan Mans, MD,as directed by  Megan Mans, MD while in the presence of Megan Mans, MD.   Established patient visit   Patient: Victoria Davila   DOB: 1956-09-25   65 y.o. Female  MRN: [id number] Visit Date: 06/08/2021  Today's healthcare provider: Megan Mans, MD   Chief Complaint  Patient presents with   Follow-up   Hyperlipidemia   Subjective    HPI  Is in today for follow-up.  Has no complaints and is doing fairly well.  Been on phenytoin for more than 30 years and her last seizure was 58.  Not interested in seeing neurology at this point for follow-up. Hypertension, follow-up  BP Readings from Last 3 Encounters:  06/08/21 132/70  02/23/21 129/87  09/29/20 118/88   Wt Readings from Last 3 Encounters:  06/08/21 248 lb (112.5 kg)  02/23/21 248 lb (112.5 kg)  09/29/20 251 lb (113.9 kg)     She was last seen for hypertension 4 months ago.  BP at that visit was 129/87. Management since that visit includes no changes.  She reports good compliance with treatment. She is not having side effects. none She is following a Regular diet. She is not exercising. She does not smoke.  Use of agents associated with hypertension: none.   Outside blood pressures are not checking.  Pertinent labs: Lab Results  Component Value Date   CHOL 293 (H) 02/25/2021   HDL 82 02/25/2021   LDLCALC 186 (H) 02/25/2021   TRIG 143 02/25/2021   CHOLHDL 3.6 02/25/2021   Lab Results  Component Value Date   NA 145 (H) 02/25/2021   K 3.9 02/25/2021   CREATININE 0.95 02/25/2021   GFRNONAA 66 12/25/2019   GFRAA 76 12/25/2019   GLUCOSE 92 02/25/2021     The 10-year ASCVD risk score Denman George DC Jr., et al., 2013) is: 8.2%   Follow up for mitral stenosis  The patient was last seen for this 1 weeks ago. Changes made at last visit  include Hold coumadin for 2 days, then start 5mg  daily.  She reports good compliance with treatment. She feels that condition is Unchanged. She is not having side effects. none  Lipid/Cholesterol, Follow-up  Last lipid panel Other pertinent labs  Lab Results  Component Value Date   CHOL 293 (H) 02/25/2021   HDL 82 02/25/2021   LDLCALC 186 (H) 02/25/2021   TRIG 143 02/25/2021   CHOLHDL 3.6 02/25/2021   Lab Results  Component Value Date   ALT 20 02/25/2021   AST 25 02/25/2021   PLT 159 02/25/2021   TSH 3.950 02/25/2021     She was last seen for this 4 months ago.  Management since that visit includes starting on rosuvastatin 10mg  daily.  She reports good compliance with treatment. She is not having side effects. none       Medications: Outpatient Medications Prior to Visit  Medication Sig   acetaminophen (TYLENOL) 500 MG tablet Take 1,000 mg by mouth every 6 (six) hours as needed for mild pain or moderate pain.   amoxicillin-clavulanate (AUGMENTIN) 875-125 MG tablet Take 1 tablet by mouth 2 (two) times daily.   aspirin 81 MG chewable tablet Chew 81 mg by mouth daily.   clotrimazole (CLOTRIMAZOLE ANTI-FUNGAL) 1 % cream Apply 1 application topically 2 (two) times daily.   COVID-19 mRNA Vac-TriS, Pfizer, SUSP injection  Inject into the muscle.   DILANTIN 100 MG ER capsule TAKE FOUR CAPSULES DAILY   metoprolol succinate (TOPROL-XL) 25 MG 24 hr tablet Take 25 mg by mouth daily.   Multiple Vitamin (MULTIVITAMIN WITH MINERALS) TABS tablet Take 1 tablet by mouth at bedtime.   Omega-3 Fatty Acids (FISH OIL) 1200 MG CAPS Take 1 capsule by mouth 2 (two) times daily.    potassium chloride (K-DUR) 10 MEQ tablet Take 3 tablets (30 mEq total) by mouth 2 (two) times daily.   psyllium (REGULOID) 0.52 G capsule Take 0.52 g by mouth 2 (two) times daily.   rosuvastatin (CRESTOR) 10 MG tablet Take 1 tablet (10 mg total) by mouth daily.   torsemide (DEMADEX) 20 MG tablet Take 2 tablets (40 mg  total) by mouth 2 (two) times daily.   valACYclovir (VALTREX) 1000 MG tablet Take 1 tablet (1,000 mg total) by mouth 2 (two) times daily. For one day.   warfarin (COUMADIN) 3 MG tablet Take 2 tablets (6 mg total) by mouth daily.   warfarin (COUMADIN) 4 MG tablet TAKE ONE TABLET BY MOUTH EVERY DAY WITH A 3MG  TABLET   warfarin (COUMADIN) 5 MG tablet TAKE ONE TABLET EVERY DAY   warfarin (COUMADIN) 6 MG tablet Take 1 tablet (6 mg total) by mouth daily.   No facility-administered medications prior to visit.    Review of Systems  Constitutional:  Negative for activity change and fatigue.  Respiratory:  Negative for cough and shortness of breath.   Cardiovascular:  Negative for chest pain, palpitations and leg swelling.  Musculoskeletal:  Negative for arthralgias, joint swelling and myalgias.  Neurological:  Negative for dizziness, light-headedness and headaches.       Objective    BP 132/70 (BP Location: Right Arm, Patient Position: Sitting, Cuff Size: Large)   Pulse 92   Resp 18   Ht 5\' 3"  (1.6 m)   Wt 248 lb (112.5 kg)   SpO2 96%   BMI 43.93 kg/m  BP Readings from Last 3 Encounters:  06/08/21 132/70  02/23/21 129/87  09/29/20 118/88   Wt Readings from Last 3 Encounters:  06/08/21 248 lb (112.5 kg)  02/23/21 248 lb (112.5 kg)  09/29/20 251 lb (113.9 kg)       Physical Exam Vitals reviewed.  Constitutional:      General: She is not in acute distress.    Appearance: She is well-developed. She is obese.  HENT:     Head: Normocephalic and atraumatic.     Right Ear: Hearing, tympanic membrane and ear canal normal.     Left Ear: Hearing, tympanic membrane and ear canal normal.     Nose: Nose normal.     Mouth/Throat:     Mouth: Mucous membranes are moist.     Pharynx: Oropharynx is clear. Uvula midline.     Tonsils: No tonsillar exudate or tonsillar abscesses.     Comments: No Gingival hyperplasia Eyes:     General: Lids are normal. No scleral icterus.       Right eye:  No discharge.        Left eye: No discharge.     Conjunctiva/sclera: Conjunctivae normal.  Cardiovascular:     Rate and Rhythm: Normal rate and regular rhythm.     Heart sounds: Normal heart sounds.  Pulmonary:     Effort: Pulmonary effort is normal. No respiratory distress.     Breath sounds: Normal breath sounds.  Musculoskeletal:     Cervical back: Normal range of  motion and neck supple.     Comments: Chronic lymphedema both lower extremities.   Lymphadenopathy:     Cervical: No cervical adenopathy.  Skin:    General: Skin is warm and dry.     Findings: No lesion or rash.  Neurological:     General: No focal deficit present.     Mental Status: She is alert and oriented to person, place, and time.  Psychiatric:        Mood and Affect: Mood normal.        Speech: Speech normal.        Behavior: Behavior normal.        Thought Content: Thought content normal.        Judgment: Judgment normal.      Results for orders placed or performed in visit on 06/08/21  POCT INR  Result Value Ref Range   INR 2.5 2.0 - 3.0   PT 30.5     Assessment & Plan     1. Mitral valve stenosis, unspecified etiology -On Coumadin with INR 2.5-3.5. - POCT INR  2. Hypercholesteremia Crestor 10 - Lipid panel - Hepatic function panel  3. Essential (primary) hypertension Good control with prolonged  4. Cerebral seizure (HCC) Sinew Dilantin for now.  Level of 10-14 - Phenytoin level, total; Future - Phenytoin level, total  5. Lymphedema Chronic  6. Morbid obesity with BMI of 40.0-44.9, adult (HCC) Hyperlipidemia arthritis  7. AF (paroxysmal atrial fibrillation) (HCC) With RVR, followed by cardiology   No follow-ups on file.      I, Megan Mans, MD, have reviewed all documentation for this visit. The documentation on 06/17/21 for the exam, diagnosis, procedures, and orders are all accurate and complete.    Trindon Dorton Wendelyn Breslow, MD  [facility] [phone]  (phone) [phone] (fax)  [facility]

## 2021-06-09 LAB — HEPATIC FUNCTION PANEL
ALT: 22 IU/L (ref 0–32)
AST: 23 IU/L (ref 0–40)
Albumin: 4.3 g/dL (ref 3.8–4.8)
Alkaline Phosphatase: 148 IU/L — ABNORMAL HIGH (ref 44–121)
Bilirubin Total: 0.3 mg/dL (ref 0.0–1.2)
Bilirubin, Direct: 0.12 mg/dL (ref 0.00–0.40)
Total Protein: 7.3 g/dL (ref 6.0–8.5)

## 2021-06-09 LAB — LIPID PANEL
Chol/HDL Ratio: 2.9 ratio (ref 0.0–4.4)
Cholesterol, Total: 212 mg/dL — ABNORMAL HIGH (ref 100–199)
HDL: 72 mg/dL (ref >39)
LDL Chol Calc (NIH): 113 mg/dL — ABNORMAL HIGH (ref 0–99)
Triglycerides: 156 mg/dL — ABNORMAL HIGH (ref 0–149)
VLDL Cholesterol Cal: 27 mg/dL (ref 5–40)

## 2021-06-09 LAB — PHENYTOIN LEVEL, TOTAL: Phenytoin (Dilantin), Serum: 12.1 ug/mL (ref 10.0–20.0)

## 2021-06-15 ENCOUNTER — Ambulatory Visit (INDEPENDENT_AMBULATORY_CARE_PROVIDER_SITE_OTHER): Payer: Medicare PPO

## 2021-06-15 ENCOUNTER — Other Ambulatory Visit: Payer: Self-pay

## 2021-06-15 DIAGNOSIS — I050 Rheumatic mitral stenosis: Secondary | ICD-10-CM

## 2021-06-15 LAB — POCT INR
INR: 3.2 — AB (ref 2.0–3.0)
PT: 38.7

## 2021-06-15 NOTE — Patient Instructions (Signed)
Description   5 mg daily, f/u 4 weeks

## 2021-07-07 DIAGNOSIS — Z952 Presence of prosthetic heart valve: Secondary | ICD-10-CM | POA: Diagnosis not present

## 2021-07-07 DIAGNOSIS — Z9889 Other specified postprocedural states: Secondary | ICD-10-CM | POA: Diagnosis not present

## 2021-07-12 ENCOUNTER — Other Ambulatory Visit: Payer: Self-pay | Admitting: Family Medicine

## 2021-07-12 DIAGNOSIS — G40909 Epilepsy, unspecified, not intractable, without status epilepticus: Secondary | ICD-10-CM

## 2021-07-13 ENCOUNTER — Ambulatory Visit: Payer: Medicare PPO

## 2021-07-13 ENCOUNTER — Other Ambulatory Visit: Payer: Self-pay

## 2021-07-13 DIAGNOSIS — I052 Rheumatic mitral stenosis with insufficiency: Secondary | ICD-10-CM | POA: Diagnosis not present

## 2021-07-13 DIAGNOSIS — I050 Rheumatic mitral stenosis: Secondary | ICD-10-CM | POA: Diagnosis not present

## 2021-07-13 LAB — POCT INR
INR: 2.8 (ref 2.0–3.0)
PT: 33.9

## 2021-07-13 NOTE — Patient Instructions (Signed)
Continue 5mg daily.  Recheck in four weeks. 

## 2021-08-10 ENCOUNTER — Ambulatory Visit (INDEPENDENT_AMBULATORY_CARE_PROVIDER_SITE_OTHER): Payer: Medicare PPO

## 2021-08-10 ENCOUNTER — Other Ambulatory Visit: Payer: Self-pay

## 2021-08-10 DIAGNOSIS — I052 Rheumatic mitral stenosis with insufficiency: Secondary | ICD-10-CM

## 2021-08-10 DIAGNOSIS — Z23 Encounter for immunization: Secondary | ICD-10-CM | POA: Diagnosis not present

## 2021-08-10 DIAGNOSIS — I050 Rheumatic mitral stenosis: Secondary | ICD-10-CM | POA: Diagnosis not present

## 2021-08-10 LAB — POCT INR
INR: 3.6 — AB (ref 2.0–3.0)
PT: 42.8

## 2021-08-10 NOTE — Patient Instructions (Signed)
Skip one day, then continue 5mg  daily.  Recheck in two weeks.

## 2021-08-16 ENCOUNTER — Other Ambulatory Visit: Payer: Self-pay | Admitting: Family Medicine

## 2021-08-16 DIAGNOSIS — I050 Rheumatic mitral stenosis: Secondary | ICD-10-CM

## 2021-08-23 ENCOUNTER — Other Ambulatory Visit: Payer: Self-pay

## 2021-08-23 ENCOUNTER — Ambulatory Visit (INDEPENDENT_AMBULATORY_CARE_PROVIDER_SITE_OTHER): Payer: Medicare PPO | Admitting: Family Medicine

## 2021-08-23 VITALS — BP 149/81 | HR 102 | Temp 99.0°F | Wt 243.0 lb

## 2021-08-23 DIAGNOSIS — E559 Vitamin D deficiency, unspecified: Secondary | ICD-10-CM

## 2021-08-23 DIAGNOSIS — G8929 Other chronic pain: Secondary | ICD-10-CM

## 2021-08-23 DIAGNOSIS — Z780 Asymptomatic menopausal state: Secondary | ICD-10-CM

## 2021-08-23 DIAGNOSIS — M545 Low back pain, unspecified: Secondary | ICD-10-CM

## 2021-08-23 DIAGNOSIS — Z0000 Encounter for general adult medical examination without abnormal findings: Secondary | ICD-10-CM

## 2021-08-23 DIAGNOSIS — R350 Frequency of micturition: Secondary | ICD-10-CM

## 2021-08-23 DIAGNOSIS — I890 Lymphedema, not elsewhere classified: Secondary | ICD-10-CM

## 2021-08-23 DIAGNOSIS — Z1231 Encounter for screening mammogram for malignant neoplasm of breast: Secondary | ICD-10-CM | POA: Diagnosis not present

## 2021-08-23 DIAGNOSIS — Z96642 Presence of left artificial hip joint: Secondary | ICD-10-CM

## 2021-08-23 DIAGNOSIS — Z952 Presence of prosthetic heart valve: Secondary | ICD-10-CM | POA: Diagnosis not present

## 2021-08-23 DIAGNOSIS — Z1211 Encounter for screening for malignant neoplasm of colon: Secondary | ICD-10-CM

## 2021-08-23 DIAGNOSIS — I480 Paroxysmal atrial fibrillation: Secondary | ICD-10-CM

## 2021-08-23 DIAGNOSIS — E7800 Pure hypercholesterolemia, unspecified: Secondary | ICD-10-CM

## 2021-08-23 DIAGNOSIS — Z6841 Body Mass Index (BMI) 40.0 and over, adult: Secondary | ICD-10-CM

## 2021-08-23 DIAGNOSIS — I5032 Chronic diastolic (congestive) heart failure: Secondary | ICD-10-CM

## 2021-08-23 DIAGNOSIS — G40909 Epilepsy, unspecified, not intractable, without status epilepticus: Secondary | ICD-10-CM

## 2021-08-23 DIAGNOSIS — Z13820 Encounter for screening for osteoporosis: Secondary | ICD-10-CM

## 2021-08-23 DIAGNOSIS — I050 Rheumatic mitral stenosis: Secondary | ICD-10-CM | POA: Diagnosis not present

## 2021-08-23 DIAGNOSIS — I10 Essential (primary) hypertension: Secondary | ICD-10-CM | POA: Diagnosis not present

## 2021-08-23 DIAGNOSIS — Z9889 Other specified postprocedural states: Secondary | ICD-10-CM

## 2021-08-23 LAB — POCT URINALYSIS DIPSTICK
Bilirubin, UA: NEGATIVE
Blood, UA: NEGATIVE
Glucose, UA: NEGATIVE
Ketones, UA: NEGATIVE
Nitrite, UA: NEGATIVE
Protein, UA: POSITIVE — AB
Spec Grav, UA: 1.025 (ref 1.010–1.025)
Urobilinogen, UA: 0.2 E.U./dL
pH, UA: 6.5 (ref 5.0–8.0)

## 2021-08-23 LAB — POCT INR
INR: 1.6 — AB (ref 2.0–3.0)
PT: 19

## 2021-08-23 NOTE — Progress Notes (Signed)
Annual Wellness Visit     Patient: Victoria Davila, Female    DOB: November 17, 1955, 65 y.o.   MRN: [id number] Visit Date: 08/23/2021  Today's Provider: Megan Mans, MD   No chief complaint on file.  Subjective    Victoria Davila is a 65 y.o. female who presents today for her Annual Wellness Visit. Annual Physical. She reports consuming a general diet. The patient does not participate in regular exercise at present. She generally feels well. She reports sleeping well. She does have additional problems to discuss today.  She has chronic arthritic and back pain. Her INR is a little off today goal is 2.5-3.5.  She has mechanical heart valve HPI    Medications: Outpatient Medications Prior to Visit  Medication Sig   acetaminophen (TYLENOL) 500 MG tablet Take 1,000 mg by mouth every 6 (six) hours as needed for mild pain or moderate pain.   amoxicillin-clavulanate (AUGMENTIN) 875-125 MG tablet Take 1 tablet by mouth 2 (two) times daily.   aspirin 81 MG chewable tablet Chew 81 mg by mouth daily.   clotrimazole (CLOTRIMAZOLE ANTI-FUNGAL) 1 % cream Apply 1 application topically 2 (two) times daily.   COVID-19 mRNA Vac-TriS, Pfizer, SUSP injection Inject into the muscle.   DILANTIN 100 MG ER capsule TAKE FOUR CAPSULES BY MOUTH EVERY DAY   metoprolol succinate (TOPROL-XL) 100 MG 24 hr tablet Take 100 mg by mouth daily.   Multiple Vitamin (MULTIVITAMIN WITH MINERALS) TABS tablet Take 1 tablet by mouth at bedtime.   Omega-3 Fatty Acids (FISH OIL) 1200 MG CAPS Take 1 capsule by mouth 2 (two) times daily.    potassium chloride (K-DUR) 10 MEQ tablet Take 3 tablets (30 mEq total) by mouth 2 (two) times daily.   psyllium (REGULOID) 0.52 G capsule Take 0.52 g by mouth 2 (two) times daily.   rosuvastatin (CRESTOR) 10 MG tablet Take 1 tablet (10 mg total) by mouth daily.   torsemide (DEMADEX) 20 MG tablet Take 2 tablets (40 mg total) by mouth 2 (two) times daily.   valACYclovir  (VALTREX) 1000 MG tablet Take 1 tablet (1,000 mg total) by mouth 2 (two) times daily. For one day.   warfarin (COUMADIN) 3 MG tablet Take 2 tablets (6 mg total) by mouth daily.   warfarin (COUMADIN) 4 MG tablet TAKE ONE TABLET BY MOUTH EVERY DAY WITH A 3MG  TABLET   warfarin (COUMADIN) 5 MG tablet TAKE ONE TABLET EVERY DAY   warfarin (COUMADIN) 6 MG tablet Take 1 tablet (6 mg total) by mouth daily.   [DISCONTINUED] metoprolol succinate (TOPROL-XL) 25 MG 24 hr tablet Take 25 mg by mouth daily.   No facility-administered medications prior to visit.    No Known Allergies  Patient Care Team: ., MD as PCP - General (Family Medicine)  Review of Systems  Constitutional: Negative.   HENT:  Positive for sinus pressure.   Eyes: Negative.   Respiratory:  Positive for shortness of breath.   Cardiovascular:  Positive for leg swelling.  Gastrointestinal: Negative.   Endocrine: Negative.   Genitourinary:  Positive for enuresis.  Musculoskeletal:  Positive for back pain and neck pain.  Skin: Negative.   Allergic/Immunologic: Negative.   Neurological: Negative.   Hematological: Negative.   Psychiatric/Behavioral:  Positive for decreased concentration.         Objective    Vitals: BP (!) 149/81 (BP Location: Right Arm, Patient Position: Sitting, Cuff Size: Large)   Pulse (!) 102  Temp 99 F (37.2 C) (Oral)   Wt 243 lb (110.2 kg)   SpO2 99%   BMI 43.05 kg/m     Physical Exam Constitutional:      Appearance: Normal appearance. She is obese.  HENT:     Head: Normocephalic and atraumatic.     Right Ear: Tympanic membrane, ear canal and external ear normal.     Left Ear: Tympanic membrane, ear canal and external ear normal.     Nose: Nose normal.     Mouth/Throat:     Mouth: Mucous membranes are moist.     Pharynx: Oropharynx is clear.  Eyes:     Extraocular Movements: Extraocular movements intact.     Conjunctiva/sclera: Conjunctivae normal.     Pupils: Pupils  are equal, round, and reactive to light.  Cardiovascular:     Rate and Rhythm: Normal rate and regular rhythm.     Pulses: Normal pulses.     Heart sounds: Normal heart sounds.  Pulmonary:     Effort: Pulmonary effort is normal.     Breath sounds: Normal breath sounds.  Abdominal:     General: Abdomen is flat. Bowel sounds are normal.     Palpations: Abdomen is soft.  Musculoskeletal:     Cervical back: Normal range of motion and neck supple.     Right lower leg: Edema present.     Left lower leg: Edema present.  Skin:    General: Skin is warm and dry.  Neurological:     General: No focal deficit present.     Mental Status: She is alert and oriented to person, place, and time.  Psychiatric:        Mood and Affect: Mood normal.        Behavior: Behavior normal.        Thought Content: Thought content normal.        Judgment: Judgment normal.    Most recent functional status assessment: In your present state of health, do you have any difficulty performing the following activities: 08/23/2021  Hearing? N  Vision? N  Difficulty concentrating or making decisions? N  Walking or climbing stairs? Y  Dressing or bathing? N  Doing errands, shopping? N  Some recent data might be hidden   Most recent fall risk assessment: Fall Risk  08/23/2021  Falls in the past year? 0  Number falls in past yr: 0  Injury with Fall? 0  Risk for fall due to : -    Most recent depression screenings: PHQ 2/9 Scores 08/23/2021 02/23/2021  PHQ - 2 Score 0 0  PHQ- 9 Score 1 1   Most recent cognitive screening: No flowsheet data found. Most recent Audit-C alcohol use screening Alcohol Use Disorder Test (AUDIT) 08/23/2021  1. How often do you have a drink containing alcohol? 1  2. How many drinks containing alcohol do you have on a typical day when you are drinking? 0  3. How often do you have six or more drinks on one occasion? 0  AUDIT-C Score 1   A score of 3 or more in women, and 4 or more  in men indicates increased risk for alcohol abuse, EXCEPT if all of the points are from question 1   Results for orders placed or performed in visit on 08/23/21  POCT urinalysis dipstick  Result Value Ref Range   Color, UA yellow    Clarity, UA cloudy    Glucose, UA Negative Negative   Bilirubin, UA neg  Ketones, UA neg    Spec Grav, UA 1.025 1.010 - 1.025   Blood, UA neg    pH, UA 6.5 5.0 - 8.0   Protein, UA Positive (A) Negative   Urobilinogen, UA 0.2 0.2 or 1.0 E.U./dL   Nitrite, UA neg    Leukocytes, UA Small (1+) (A) Negative   Appearance     Odor      Assessment & Plan     Annual wellness visit done today including the all of the following: Reviewed patient's Family Medical History Reviewed and updated list of patient's medical providers Assessment of cognitive impairment was done Assessed patient's functional ability Established a written schedule for health screening services Health Risk Assessent Completed and Reviewed  Exercise Activities and Dietary recommendations  Goals   None     Immunization History  Administered Date(s) Administered   Fluad Quad(high Dose 65+) 08/18/2020, 08/10/2021   H1N1 08/28/2008   Influenza,inj,Quad PF,6+ Mos 08/11/2016, 08/01/2017, 08/28/2018, 07/08/2019   Influenza-Unspecified 08/11/2013   PFIZER Comirnaty(Gray Top)Covid-19 Tri-Sucrose Vaccine 05/17/2021   PFIZER(Purple Top)SARS-COV-2 Vaccination 12/24/2019, 01/14/2020, 08/02/2020   Tdap 07/02/2008, 11/02/2010    Health Maintenance  Topic Date Due   Zoster Vaccines- Shingrix (1 of 2) Never done   MAMMOGRAM  02/19/2010   Pneumonia Vaccine 70+ Years old (1 - PCV) Never done   DEXA SCAN  Never done   TETANUS/TDAP  11/02/2020   COLONOSCOPY (Pts 45-43yrs Insurance coverage will need to be confirmed)  05/14/2021   COVID-19 Vaccine (5 - Booster for Pfizer series) 07/12/2021   INFLUENZA VACCINE  Completed   Hepatitis C Screening  Completed   HPV VACCINES  Aged Out      Discussed health benefits of physical activity, and encouraged her to engage in regular exercise appropriate for her age and condition.    1. Medicare annual wellness visit, subsequent Will discuss MOST forms in the future. He lives independently with her husband and they are doing well.  2. Annual physical exam Commend appropriate screening.  No Pap smear needed.  3. Essential (primary) hypertension Fairly good blood pressure control metoprolol  4. Hypercholesteremia Rosuvastatin 10.  5. Status post mitral valve replacement Mechanical valve in place. INR goal of 2.5-3.5 this time change Coumadin dose to 6 mg daily and 5 mg Monday Wednesday Friday and repeat INR 1 week  6. Frequency of urination  - POCT urinalysis dipstick - Urine Culture  7. Encounter for screening mammogram for malignant neoplasm of breast Mammogram.  - MM 3D SCREEN BREAST BILATERAL; Future  8. Colon cancer screening To GI. - Ambulatory referral to Gastroenterology  9. Osteoporosis screening BMD appropriate - DG Bone Density; Future  10. Post-menopausal  - DG Bone Density; Future  11. Mitral valve stenosis, unspecified etiology  - POCT INR  12. Lymphedema   13. Chronic diastolic heart failure, NYHA class 2 (HCC) Torsemide  14. AF (paroxysmal atrial fibrillation) (HCC) Followed by cardiology  15. Cerebral seizure (HCC) Check phenytoin level in the future.  Stable on last lab draw  16. Chronic bilateral low back pain without sciatica Consider physical therapy  17. Morbid obesity with BMI of 40.0-44.9, adult (HCC) And exercise stressed as I think any weight loss would help her with her chronic pain and with movement.  18. Status post total replacement of left hip   19. H/O mitral valve replacement with mechanical valve   20. H/O maze procedure   21. Avitaminosis D    No follow-ups on file.  I, Megan Mans, MD, have reviewed all documentation for this  visit. The documentation on 08/27/21 for the exam, diagnosis, procedures, and orders are all accurate and complete.    Cohan Stipes Wendelyn Breslow, MD  [facility] [phone] (phone) [phone] (fax)  [facility]

## 2021-08-29 LAB — URINE CULTURE

## 2021-08-29 LAB — SPECIMEN STATUS REPORT

## 2021-08-31 ENCOUNTER — Other Ambulatory Visit: Payer: Self-pay

## 2021-08-31 ENCOUNTER — Ambulatory Visit: Payer: Medicare PPO

## 2021-08-31 DIAGNOSIS — I5032 Chronic diastolic (congestive) heart failure: Secondary | ICD-10-CM

## 2021-08-31 DIAGNOSIS — I050 Rheumatic mitral stenosis: Secondary | ICD-10-CM

## 2021-08-31 DIAGNOSIS — G40909 Epilepsy, unspecified, not intractable, without status epilepticus: Secondary | ICD-10-CM

## 2021-08-31 DIAGNOSIS — I10 Essential (primary) hypertension: Secondary | ICD-10-CM

## 2021-08-31 DIAGNOSIS — E7800 Pure hypercholesterolemia, unspecified: Secondary | ICD-10-CM

## 2021-08-31 LAB — POCT INR
INR: 3.5 — AB (ref 2.0–3.0)
PT: 41.4

## 2021-08-31 NOTE — Patient Instructions (Signed)
Description   6 mg daily except 5mg  M- W- F. Return 2-3 weeks.

## 2021-09-01 DIAGNOSIS — I5032 Chronic diastolic (congestive) heart failure: Secondary | ICD-10-CM | POA: Diagnosis not present

## 2021-09-01 DIAGNOSIS — E7800 Pure hypercholesterolemia, unspecified: Secondary | ICD-10-CM | POA: Diagnosis not present

## 2021-09-01 DIAGNOSIS — G40909 Epilepsy, unspecified, not intractable, without status epilepticus: Secondary | ICD-10-CM | POA: Diagnosis not present

## 2021-09-01 DIAGNOSIS — I10 Essential (primary) hypertension: Secondary | ICD-10-CM | POA: Diagnosis not present

## 2021-09-02 ENCOUNTER — Encounter: Payer: Self-pay | Admitting: Family Medicine

## 2021-09-02 LAB — CBC WITH DIFFERENTIAL/PLATELET
Basophils Absolute: 0.1 10*3/uL (ref 0.0–0.2)
Basos: 1 %
EOS (ABSOLUTE): 0.1 10*3/uL (ref 0.0–0.4)
Eos: 2 %
Hematocrit: 41 % (ref 34.0–46.6)
Hemoglobin: 14.6 g/dL (ref 11.1–15.9)
Immature Grans (Abs): 0 10*3/uL (ref 0.0–0.1)
Immature Granulocytes: 0 %
Lymphocytes Absolute: 1.8 10*3/uL (ref 0.7–3.1)
Lymphs: 34 %
MCH: 31.4 pg (ref 26.6–33.0)
MCHC: 35.6 g/dL (ref 31.5–35.7)
MCV: 88 fL (ref 79–97)
Monocytes Absolute: 0.5 10*3/uL (ref 0.1–0.9)
Monocytes: 9 %
Neutrophils Absolute: 2.8 10*3/uL (ref 1.4–7.0)
Neutrophils: 54 %
Platelets: 180 10*3/uL (ref 150–450)
RBC: 4.65 x10E6/uL (ref 3.77–5.28)
RDW: 13.2 % (ref 11.7–15.4)
WBC: 5.2 10*3/uL (ref 3.4–10.8)

## 2021-09-02 LAB — COMPREHENSIVE METABOLIC PANEL
ALT: 28 IU/L (ref 0–32)
AST: 31 IU/L (ref 0–40)
Albumin/Globulin Ratio: 1.5 (ref 1.2–2.2)
Albumin: 4.4 g/dL (ref 3.8–4.8)
Alkaline Phosphatase: 180 IU/L — ABNORMAL HIGH (ref 44–121)
BUN/Creatinine Ratio: 16 (ref 12–28)
BUN: 16 mg/dL (ref 8–27)
Bilirubin Total: 0.4 mg/dL (ref 0.0–1.2)
CO2: 26 mmol/L (ref 20–29)
Calcium: 9.1 mg/dL (ref 8.7–10.3)
Chloride: 101 mmol/L (ref 96–106)
Creatinine, Ser: 0.98 mg/dL (ref 0.57–1.00)
Globulin, Total: 3 g/dL (ref 1.5–4.5)
Glucose: 94 mg/dL (ref 70–99)
Potassium: 4.4 mmol/L (ref 3.5–5.2)
Sodium: 144 mmol/L (ref 134–144)
Total Protein: 7.4 g/dL (ref 6.0–8.5)
eGFR: 64 mL/min/{1.73_m2} (ref >59)

## 2021-09-02 LAB — LIPID PANEL WITH LDL/HDL RATIO
Cholesterol, Total: 228 mg/dL — ABNORMAL HIGH (ref 100–199)
HDL: 78 mg/dL (ref >39)
LDL Chol Calc (NIH): 130 mg/dL — ABNORMAL HIGH (ref 0–99)
LDL/HDL Ratio: 1.7 ratio (ref 0.0–3.2)
Triglycerides: 118 mg/dL (ref 0–149)
VLDL Cholesterol Cal: 20 mg/dL (ref 5–40)

## 2021-09-02 LAB — PHENYTOIN LEVEL, TOTAL: Phenytoin (Dilantin), Serum: 12.6 ug/mL (ref 10.0–20.0)

## 2021-09-02 LAB — TSH: TSH: 3.44 u[IU]/mL (ref 0.450–4.500)

## 2021-09-07 ENCOUNTER — Other Ambulatory Visit: Payer: Self-pay

## 2021-09-07 DIAGNOSIS — E7800 Pure hypercholesterolemia, unspecified: Secondary | ICD-10-CM

## 2021-09-14 ENCOUNTER — Ambulatory Visit: Payer: Medicare PPO | Admitting: Family Medicine

## 2021-09-14 ENCOUNTER — Other Ambulatory Visit: Payer: Self-pay

## 2021-09-14 ENCOUNTER — Other Ambulatory Visit: Payer: Self-pay | Admitting: *Deleted

## 2021-09-14 DIAGNOSIS — R7989 Other specified abnormal findings of blood chemistry: Secondary | ICD-10-CM

## 2021-09-14 DIAGNOSIS — E7800 Pure hypercholesterolemia, unspecified: Secondary | ICD-10-CM

## 2021-09-14 DIAGNOSIS — I050 Rheumatic mitral stenosis: Secondary | ICD-10-CM | POA: Diagnosis not present

## 2021-09-14 LAB — POCT INR
INR: 4.4 — AB (ref 2.0–3.0)
PT: 52.5

## 2021-09-14 MED ORDER — ROSUVASTATIN CALCIUM 20 MG PO TABS: 20.0000 mg | ORAL_TABLET | Freq: Every day | ORAL | 3 refills | Status: DC

## 2021-09-14 NOTE — Patient Instructions (Signed)
Take 5 mg daily. Return 2 weeks.

## 2021-09-20 ENCOUNTER — Ambulatory Visit
Admission: RE | Admit: 2021-09-20 | Discharge: 2021-09-20 | Disposition: A | Discharge status: Home / Self Care | Payer: Medicare PPO | Source: Ambulatory Visit | Attending: Family Medicine | Admitting: Family Medicine

## 2021-09-20 ENCOUNTER — Other Ambulatory Visit: Payer: Self-pay

## 2021-09-20 DIAGNOSIS — Z13820 Encounter for screening for osteoporosis: Secondary | ICD-10-CM | POA: Insufficient documentation

## 2021-09-20 DIAGNOSIS — Z1231 Encounter for screening mammogram for malignant neoplasm of breast: Secondary | ICD-10-CM

## 2021-09-20 DIAGNOSIS — Z780 Asymptomatic menopausal state: Secondary | ICD-10-CM | POA: Diagnosis not present

## 2021-09-21 ENCOUNTER — Other Ambulatory Visit: Payer: Self-pay | Admitting: Family Medicine

## 2021-09-21 DIAGNOSIS — R928 Other abnormal and inconclusive findings on diagnostic imaging of breast: Secondary | ICD-10-CM

## 2021-09-21 DIAGNOSIS — R921 Mammographic calcification found on diagnostic imaging of breast: Secondary | ICD-10-CM

## 2021-09-21 DIAGNOSIS — N6489 Other specified disorders of breast: Secondary | ICD-10-CM

## 2021-09-21 DIAGNOSIS — N6320 Unspecified lump in the left breast, unspecified quadrant: Secondary | ICD-10-CM

## 2021-09-25 ENCOUNTER — Other Ambulatory Visit: Payer: Self-pay

## 2021-09-25 MED ORDER — NA SULFATE-K SULFATE-MG SULF 17.5-3.13-1.6 GM/177ML PO SOLN: 1.0000 | ORAL | 0 refills | Status: DC

## 2021-09-28 ENCOUNTER — Ambulatory Visit: Payer: Medicare PPO

## 2021-09-28 ENCOUNTER — Other Ambulatory Visit: Payer: Self-pay

## 2021-09-28 DIAGNOSIS — I050 Rheumatic mitral stenosis: Secondary | ICD-10-CM | POA: Diagnosis not present

## 2021-09-28 LAB — POCT INR
INR: 2.7 (ref 2.0–3.0)
PT: 33

## 2021-09-28 NOTE — Patient Instructions (Signed)
Description   Take 5 mg daily. Return 3 weeks, patient request.

## 2021-10-06 ENCOUNTER — Ambulatory Visit
Admission: RE | Admit: 2021-10-06 | Discharge: 2021-10-06 | Disposition: A | Discharge status: Home / Self Care | Payer: Medicare PPO | Source: Ambulatory Visit | Attending: Family Medicine | Admitting: Family Medicine

## 2021-10-06 ENCOUNTER — Other Ambulatory Visit: Payer: Self-pay

## 2021-10-06 DIAGNOSIS — N6489 Other specified disorders of breast: Secondary | ICD-10-CM

## 2021-10-06 DIAGNOSIS — N6320 Unspecified lump in the left breast, unspecified quadrant: Secondary | ICD-10-CM | POA: Diagnosis not present

## 2021-10-06 DIAGNOSIS — R922 Inconclusive mammogram: Secondary | ICD-10-CM | POA: Diagnosis not present

## 2021-10-06 DIAGNOSIS — R928 Other abnormal and inconclusive findings on diagnostic imaging of breast: Secondary | ICD-10-CM

## 2021-10-06 DIAGNOSIS — R921 Mammographic calcification found on diagnostic imaging of breast: Secondary | ICD-10-CM | POA: Diagnosis not present

## 2021-10-12 DIAGNOSIS — E7800 Pure hypercholesterolemia, unspecified: Secondary | ICD-10-CM | POA: Diagnosis not present

## 2021-10-13 LAB — HEPATIC FUNCTION PANEL
ALT: 24 IU/L (ref 0–32)
AST: 26 IU/L (ref 0–40)
Albumin: 4.3 g/dL (ref 3.8–4.8)
Alkaline Phosphatase: 155 IU/L — ABNORMAL HIGH (ref 44–121)
Bilirubin Total: 0.3 mg/dL (ref 0.0–1.2)
Bilirubin, Direct: 0.12 mg/dL (ref 0.00–0.40)
Total Protein: 7.6 g/dL (ref 6.0–8.5)

## 2021-10-13 LAB — LIPID PANEL WITH LDL/HDL RATIO
Cholesterol, Total: 219 mg/dL — ABNORMAL HIGH (ref 100–199)
HDL: 82 mg/dL (ref >39)
LDL Chol Calc (NIH): 119 mg/dL — ABNORMAL HIGH (ref 0–99)
LDL/HDL Ratio: 1.5 ratio (ref 0.0–3.2)
Triglycerides: 101 mg/dL (ref 0–149)
VLDL Cholesterol Cal: 18 mg/dL (ref 5–40)

## 2021-10-19 ENCOUNTER — Other Ambulatory Visit: Payer: Self-pay

## 2021-10-19 ENCOUNTER — Ambulatory Visit: Payer: Medicare PPO

## 2021-10-19 DIAGNOSIS — I050 Rheumatic mitral stenosis: Secondary | ICD-10-CM | POA: Diagnosis not present

## 2021-10-19 DIAGNOSIS — I052 Rheumatic mitral stenosis with insufficiency: Secondary | ICD-10-CM

## 2021-10-19 LAB — POCT INR
INR: 3.1 — AB (ref 2.0–3.0)
PT: 37.7

## 2021-10-19 NOTE — Patient Instructions (Signed)
Description   Take 5 mg daily. Return 4 weeks.

## 2021-10-28 ENCOUNTER — Ambulatory Visit: Payer: Self-pay

## 2021-10-28 ENCOUNTER — Telehealth: Payer: Medicare PPO | Admitting: Physician Assistant

## 2021-10-28 DIAGNOSIS — R6889 Other general symptoms and signs: Secondary | ICD-10-CM | POA: Diagnosis not present

## 2021-10-28 MED ORDER — OSELTAMIVIR PHOSPHATE 75 MG PO CAPS: 75.0000 mg | ORAL_CAPSULE | Freq: Two times a day (BID) | ORAL | 0 refills | Status: DC

## 2021-10-28 NOTE — Progress Notes (Signed)
I have spent 5 minutes in review of e-visit questionnaire, review and updating patient chart, medical decision making and response to patient.   Pairlee Sawtell Cody Atia Haupt, PA-C    

## 2021-10-28 NOTE — Progress Notes (Signed)

## 2021-10-28 NOTE — Telephone Encounter (Signed)
Summary: cough, low grade fever   Patient husband called in to say that for the past few weeks they have had what they call the crud with a productive cough, congestion not getting any better. Patient had a fever temp 99.5 degrees. Please advise patient concerned. Can be reached at Ph# [phone]      Chief Complaint: Cough Symptoms: Cough, sinus symptoms, low grade fever Frequency: Started Wednesday Pertinent Negatives: Patient denies shortness of breath Disposition: [] ED /[] Urgent Care (no appt availability in office) / [x] Appointment(In office/virtual)/ []  [facility]/ [] Home Care/ [] Refused Recommended Disposition /[] [facility]/ []  Follow-up with PCP Additional Notes: Recommended My Chart visit today. Requests in office visit next week. Appointment made. Reason for Disposition  [1] Continuous (nonstop) coughing interferes with work or school AND [2] no improvement using cough treatment per Care Advice  Answer Assessment - Initial Assessment Questions 1. ONSET: "When did the cough begin?"      Wednesday 2. SEVERITY: "How bad is the cough today?"      Moderate 3. SPUTUM: "Describe the color of your sputum" (none, dry cough; clear, white, yellow, green)     Bloody 4. HEMOPTYSIS: "Are you coughing up any blood?" If so ask: "How much?" (flecks, streaks, tablespoons, etc.)     Flecks 5. DIFFICULTY BREATHING: "Are you having difficulty breathing?" If Yes, ask: "How bad is it?" (e.g., mild, moderate, severe)    - MILD: No SOB at rest, mild SOB with walking, speaks normally in sentences, can lie down, no retractions, pulse < 100.    - MODERATE: SOB at rest, SOB with minimal exertion and prefers to sit, cannot lie down flat, speaks in phrases, mild retractions, audible wheezing, pulse 100-120.    - SEVERE: Very SOB at rest, speaks in single words, struggling to breathe, sitting hunched forward, retractions, pulse > 120      No 6. FEVER: "Do you have a fever?" If Yes,  ask: "What is your temperature, how was it measured, and when did it start?"     99.5 7. CARDIAC HISTORY: "Do you have any history of heart disease?" (e.g., heart attack, congestive heart failure)      Valve replacement 8. LUNG HISTORY: "Do you have any history of lung disease?"  (e.g., pulmonary embolus, asthma, emphysema)     No 9. PE RISK FACTORS: "Do you have a history of blood clots?" (or: recent major surgery, recent prolonged travel, bedridden)     No 10. OTHER SYMPTOMS: "Do you have any other symptoms?" (e.g., runny nose, wheezing, chest pain)       Runny nose 11. PREGNANCY: "Is there any chance you are pregnant?" "When was your last menstrual period?"       No 12. TRAVEL: "Have you traveled out of the country in the last month?" (e.g., travel history, exposures)       No  Protocols used: Cough - Acute Productive-A-AH

## 2021-10-29 DIAGNOSIS — Z03818 Encounter for observation for suspected exposure to other biological agents ruled out: Secondary | ICD-10-CM | POA: Diagnosis not present

## 2021-10-29 DIAGNOSIS — J019 Acute sinusitis, unspecified: Secondary | ICD-10-CM | POA: Diagnosis not present

## 2021-10-29 DIAGNOSIS — J029 Acute pharyngitis, unspecified: Secondary | ICD-10-CM | POA: Diagnosis not present

## 2021-11-01 ENCOUNTER — Other Ambulatory Visit: Payer: Self-pay

## 2021-11-01 ENCOUNTER — Ambulatory Visit: Payer: Medicare PPO | Admitting: Physician Assistant

## 2021-11-01 VITALS — BP 133/88 | HR 96 | Temp 100.0°F | Wt 240.0 lb

## 2021-11-01 DIAGNOSIS — J069 Acute upper respiratory infection, unspecified: Secondary | ICD-10-CM

## 2021-11-01 NOTE — Progress Notes (Signed)
Acute Office Visit  Subjective:    Patient ID: Victoria Davila, female    DOB: [birth year], 66 y.o.   MRN: [id number]  No chief complaint on file.  Introduced myself to the patient as a Journalist, newspaper and provided education on APPs in clinical practice.    HPI Patient is here with her husband who is helping with interview Patient is in today for evaluation of cough and congestion.  She had an e-visit on 10/28/21 and an urgent care visit on 10/29/21.  She tested and results were negative for Strep, Flu A & B, RSV and Covid.  She has artificial heart valve and has because of that has been concerned about her symptoms. Patient states her symptoms began 1 week ago.  During the e-visit on 10/28/21 prior to having the flu test done she was started on Tamiflu.  On 10/29/21 she had worsening sore throat and went to the Urgent Care and that is where she had testing done.  She was told to d/c the Tamiflu and was started on Cefdinir, Hydrocodone cough syrup and Tessalon perles.  Today she reports doing better.,   States she is feeling better today but still has mild concerns for elevated temperatures.  Denies SOB, wheezing,  States she is half way through abx given in UC    Past Medical History:  Diagnosis Date   Dysrhythmia    A FIB / FOLLOWED BY DR Derinda Sis AT [facility]   Edema of both legs    CHRONIC   Hyperlipidemia    Hypertension    Mitral valve disorder    "deformed"   Neuromuscular disorder ([place])    Rheumatoid arthritis ([place])    RECENT DX - HAS NOT YET SEEN RHEUMATOLOGIST   Seizures ([place])    LAST SEIZURE JAN 1995   Shortness of breath dyspnea    WITH EXERTION    Past Surgical History:  Procedure Laterality Date   ABDOMINAL HYSTERECTOMY  2012   CARDIAC ELECTROPHYSIOLOGY [place] AND ABLATION     CARDIAC VALVE SURGERY  2001   "heart valve stretched" for mitral valve stenosis   CERVICAL POLYPECTOMY     KNEE ARTHROSCOPY  2012   left   TOTAL HIP ARTHROPLASTY Left 11/06/2014    Procedure: LEFT TOTAL HIP ARTHROPLASTY ANTERIOR APPROACH;  Surgeon: Mcarthur Rossetti, MD;  Location: [facility];  Service: Orthopedics;  Laterality: Left;    Family History  Problem Relation Age of Onset   Diabetes Mother    Hip fracture Mother    Arthritis Father    Dementia Father    Cancer Sister        Thyroid Cancer s/p Thyroidectomy   Lung disease Paternal Grandfather     Social History   Socioeconomic History   Marital status: Married    Spouse name: Not on file   Number of children: Not on file   Years of education: Not on file   Highest education level: Not on file  Occupational History   Not on file  Tobacco Use   Smoking status: Never   Smokeless tobacco: Never  Vaping Use   Vaping Use: Never used  Substance and Sexual Activity   Alcohol use: No    Comment: OCCASIONAL WINE ONLY   Drug use: No   Sexual activity: Not on file  Other Topics Concern   Not on file  Social History Narrative   Not on file   Social Determinants of Health   Financial Resource Strain: Not  on file  Food Insecurity: Not on file  Transportation Needs: Not on file  Physical Activity: Not on file  Stress: Not on file  Social Connections: Not on file  Intimate Partner Violence: Not on file    Outpatient Medications Prior to Visit  Medication Sig Dispense Refill   acetaminophen (TYLENOL) 500 MG tablet Take 1,000 mg by mouth every 6 (six) hours as needed for mild pain or moderate pain.     aspirin 81 MG chewable tablet Chew 81 mg by mouth daily.     clotrimazole (CLOTRIMAZOLE ANTI-FUNGAL) 1 % cream Apply 1 application topically 2 (two) times daily. 60 g 1   COVID-19 mRNA Vac-TriS, Pfizer, SUSP injection Inject into the muscle. 0.3 mL 0   DILANTIN 100 MG ER capsule TAKE FOUR CAPSULES BY MOUTH EVERY DAY 120 capsule 5   metoprolol succinate (TOPROL-XL) 100 MG 24 hr tablet Take 100 mg by mouth daily.     Multiple Vitamin (MULTIVITAMIN WITH MINERALS) TABS tablet Take 1 tablet by mouth  at bedtime.     Na Sulfate-K Sulfate-Mg Sulf (SUPREP BOWEL PREP KIT) 17.5-3.13-1.6 GM/177ML SOLN Take 1 kit by mouth as directed. 354 mL 0   Omega-3 Fatty Acids (FISH OIL) 1200 MG CAPS Take 1 capsule by mouth 2 (two) times daily.      oseltamivir (TAMIFLU) 75 MG capsule Take 1 capsule (75 mg total) by mouth 2 (two) times daily. 10 capsule 0   potassium chloride (K-DUR) 10 MEQ tablet Take 3 tablets (30 mEq total) by mouth 2 (two) times daily. 180 tablet 1   psyllium (REGULOID) 0.52 G capsule Take 0.52 g by mouth 2 (two) times daily.     rosuvastatin (CRESTOR) 20 MG tablet Take 1 tablet (20 mg total) by mouth daily. 90 tablet 3   torsemide (DEMADEX) 20 MG tablet Take 2 tablets (40 mg total) by mouth 2 (two) times daily. 60 tablet 4   valACYclovir (VALTREX) 1000 MG tablet Take 1 tablet (1,000 mg total) by mouth 2 (two) times daily. For one day. 60 tablet 5   warfarin (COUMADIN) 3 MG tablet Take 2 tablets (6 mg total) by mouth daily. 60 tablet 5   warfarin (COUMADIN) 4 MG tablet TAKE ONE TABLET BY MOUTH EVERY DAY WITH A 3MG TABLET 30 tablet 5   warfarin (COUMADIN) 5 MG tablet TAKE ONE TABLET EVERY DAY 30 tablet 3   warfarin (COUMADIN) 6 MG tablet Take 1 tablet (6 mg total) by mouth daily. 30 tablet 12   amoxicillin-clavulanate (AUGMENTIN) 875-125 MG tablet Take 1 tablet by mouth 2 (two) times daily. 20 tablet 0   No facility-administered medications prior to visit.    No Known Allergies  Review of Systems  Constitutional:  Positive for fatigue and fever.  HENT:  Positive for congestion, postnasal drip, rhinorrhea, sinus pressure and sinus pain. Negative for ear discharge, ear pain, facial swelling, hearing loss, sneezing, sore throat, tinnitus, trouble swallowing and voice change.   Respiratory:  Positive for cough and shortness of breath (chronic and unchanged). Negative for wheezing.   Cardiovascular:  Negative for chest pain and palpitations.  Gastrointestinal:  Negative for diarrhea, nausea  and vomiting.  Musculoskeletal:  Negative for arthralgias, myalgias, neck pain and neck stiffness.  Neurological:  Negative for dizziness, weakness, light-headedness and headaches.      Objective:    Physical Exam Constitutional:      Appearance: Normal appearance. She is obese.  HENT:     Head: Normocephalic and atraumatic.  Right Ear: Hearing, tympanic membrane, ear canal and external ear normal.     Left Ear: Hearing, tympanic membrane, ear canal and external ear normal.     Nose:     Right Sinus: Maxillary sinus tenderness present. No frontal sinus tenderness.     Left Sinus: Maxillary sinus tenderness present. No frontal sinus tenderness.     Mouth/Throat:     Lips: Pink.     Mouth: Mucous membranes are moist.     Pharynx: Oropharynx is clear. Uvula midline. Posterior oropharyngeal erythema present. No oropharyngeal exudate or uvula swelling.  Eyes:     Pupils: Pupils are equal, round, and reactive to light.  Cardiovascular:     Rate and Rhythm: Rhythm regularly irregular.     Pulses: Decreased pulses.     Heart sounds: Heart sounds are distant. Murmur heard.  Systolic murmur is present with a grade of 1/6.     Comments: Systolic Clicking sound appreciated during auscultation.  Pulmonary:     Effort: Pulmonary effort is normal.     Breath sounds: Normal breath sounds and air entry. No decreased breath sounds, wheezing, rhonchi or rales.     Comments: Negative egophony  Musculoskeletal:     Right lower leg: 1+ Edema present.     Left lower leg: 2+ Edema present.    BP 133/88 (BP Location: Right Arm, Patient Position: Sitting, Cuff Size: Large)    Pulse 96    Temp 100 F (37.8 C) (Oral)    Wt 240 lb (108.9 kg)    SpO2 100%    BMI 42.51 kg/m  Wt Readings from Last 3 Encounters:  11/01/21 240 lb (108.9 kg)  08/23/21 243 lb (110.2 kg)  06/08/21 248 lb (112.5 kg)    Health Maintenance Due  Topic Date Due   Zoster Vaccines- Shingrix (1 of 2) Never done   Pneumonia  Vaccine 53+ Years old (1 - PCV) Never done   TETANUS/TDAP  11/02/2020   COLONOSCOPY (Pts 45-50yr Insurance coverage will need to be confirmed)  05/14/2021   COVID-19 Vaccine (5 - Booster for [place]) 07/12/2021    There are no preventive care reminders to display for this patient.   Lab Results  Component Value Date   TSH 3.440 09/01/2021   Lab Results  Component Value Date   WBC 5.2 09/01/2021   HGB 14.6 09/01/2021   HCT 41.0 09/01/2021   MCV 88 09/01/2021   PLT 180 09/01/2021   Lab Results  Component Value Date   NA 144 09/01/2021   K 4.4 09/01/2021   CO2 26 09/01/2021   GLUCOSE 94 09/01/2021   BUN 16 09/01/2021   CREATININE 0.98 09/01/2021   BILITOT 0.3 10/12/2021   ALKPHOS 155 (H) 10/12/2021   AST 26 10/12/2021   ALT 24 10/12/2021   PROT 7.6 10/12/2021   ALBUMIN 4.3 10/12/2021   CALCIUM 9.1 09/01/2021   ANIONGAP 11 11/07/2014   EGFR 64 09/01/2021   Lab Results  Component Value Date   CHOL 219 (H) 10/12/2021   Lab Results  Component Value Date   HDL 82 10/12/2021   Lab Results  Component Value Date   LDLCALC 119 (H) 10/12/2021   Lab Results  Component Value Date   TRIG 101 10/12/2021   Lab Results  Component Value Date   CHOLHDL 2.9 06/08/2021   Lab Results  Component Value Date   HGBA1C 5.6 03/01/2016       Assessment & Plan:    Problem List  Items Addressed This Visit   None Visit Diagnoses     Viral upper respiratory tract infection    -  Primary     Visit with patient indicates symptoms comprised of cough, sinus pressure, low fever, congruent with acute URI that is likely viral in nature but bacterial sinusitis cannot be ruled out at this time  Recent testing was negative for Flu, COVID, Strep and RSV Patient was given Cefdinir 312m PO BID x7 days for potential bacterial infection at Urgent Care as well as Tessalon pearls and Tussionex for cough.  Recommend to continue with current regimen to prevent complications given  cardiac status and valve replacement.  Recommend Tylenol for fever control.  Discussed with patient the various viral and bacterial etiologies of current illness and appropriate course of treatment Discussed return precautions if symptoms are not improving or worsen over next 5-7 days.      The entirety of the information documented in the History of Present Illness, Review of Systems and Physical Exam were personally obtained by me. Portions of this information were initially documented by the CMA and reviewed by me for thoroughness and accuracy.   Kosisochukwu Burningham E Tanairi Cypert, PA-C

## 2021-11-01 NOTE — Patient Instructions (Addendum)
Based on your described symptoms and the duration of symptoms it is likely that you have a viral upper respiratory infection (often called a "cold")  Symptoms can last for 3-10 days with lingering cough and intermittent symptoms lasting weeks after that.  The goal of treatment at this time is to reduce your symptoms and discomfort   Continue the antibiotic that you were given at Urgent Care until it is complete.  You can continue the other medications they gave as well to help control your symptoms. This would be your Tessalon pearls and Tussionix   You can take Tylenol to help with controlling your fever.   Nasal rinses and nasal saline sprays can help with sinus congestion A humidifier at night can also help to make sure your nose does not get too dry.   If your symptoms do not improve or become worse in the next 5-7 days please make an apt at the office so we can see you  Go to the ER if you begin to have more serious symptoms such as shortness of breath, trouble breathing, loss of consciousness, swelling around the eyes, high fever, severe lasting headaches, vision changes or neck pain/stiffness.

## 2021-11-10 MED ORDER — CLENPIQ 10-3.5-12 MG-GM -GM/160ML PO SOLN: ORAL | 0 refills | Status: DC

## 2021-11-10 NOTE — Addendum Note (Signed)
Addended by: Roena MaladyEVONTENNO, Nayra Coury Y on: 11/10/2021 11:03 AM   Modules accepted: Orders

## 2021-11-16 ENCOUNTER — Ambulatory Visit: Payer: Medicare PPO

## 2021-11-16 ENCOUNTER — Other Ambulatory Visit: Payer: Self-pay

## 2021-11-16 DIAGNOSIS — I050 Rheumatic mitral stenosis: Secondary | ICD-10-CM

## 2021-11-16 DIAGNOSIS — I052 Rheumatic mitral stenosis with insufficiency: Secondary | ICD-10-CM

## 2021-11-16 LAB — POCT INR
INR: 3.3 — AB (ref 2.0–3.0)
PT: 39.6

## 2021-11-16 NOTE — Patient Instructions (Signed)
Continue 5mg  daily and recheck in four weeks.

## 2021-11-17 ENCOUNTER — Telehealth: Payer: Self-pay

## 2021-11-17 NOTE — Telephone Encounter (Signed)
I spoke to husband and they would like to rescheduled for 12/20/21, I will call Endo to have this moved... The patient already has the Lovenox for bridging prior to procedure and after

## 2021-11-17 NOTE — Telephone Encounter (Signed)
Patient wants to reschedule procedure for 11/22/21. Husband states that they want to reschedule procedure for 1 month due to blood thinner and having to use a alternative medication

## 2021-11-21 ENCOUNTER — Ambulatory Visit: Payer: Self-pay

## 2021-11-21 DIAGNOSIS — Z1211 Encounter for screening for malignant neoplasm of colon: Secondary | ICD-10-CM

## 2021-11-21 NOTE — Telephone Encounter (Addendum)
Per patient she would like to do Cologuard instead of Colonoscopy. Ordered place

## 2021-11-21 NOTE — Telephone Encounter (Signed)
Please review for Dr. Gilbert  Thanks,   -Hanni Milford  

## 2021-11-21 NOTE — Addendum Note (Signed)
Addended by: Marjie Skiff on: 11/21/2021 12:39 PM   Modules accepted: Orders

## 2021-11-21 NOTE — Telephone Encounter (Signed)
Can we confirm whether she is talking about cologuard (the box) or stool cards please? Ok to place order for either that she wants.

## 2021-11-21 NOTE — Telephone Encounter (Signed)
10 years since colonoscopy On warfarin and [facility] wants Lovenox shots prior to procedure. Colonoscopy in 4 weeks. Pt asking for Rx cologaurd  and if came back ok can postpone. 1st invasive procedure since heart surgery. Asking for call back. Uses [facility].  Reason for Disposition  [1] Caller requesting NON-URGENT health information AND [2] PCP's office is the best resource  Answer Assessment - Initial Assessment Questions 1. REASON FOR CALL or QUESTION: "What is your reason for calling today?" or "How can I best help you?" or "What question do you have that I can help answer?"     Wants cologuard to check for blood so she may be able to postpone colonoscopy  Protocols used: Information Only Call - No Triage-A-AH

## 2021-11-28 DIAGNOSIS — Z1211 Encounter for screening for malignant neoplasm of colon: Secondary | ICD-10-CM | POA: Diagnosis not present

## 2021-12-04 LAB — COLOGUARD: COLOGUARD: NEGATIVE

## 2021-12-13 ENCOUNTER — Telehealth: Payer: Self-pay

## 2021-12-13 NOTE — Telephone Encounter (Signed)
Patient did cologuard and it did come back negative and patient husband is calling to cancel patient colonoscopy . Called trish and got patient cancel

## 2021-12-14 ENCOUNTER — Other Ambulatory Visit: Payer: Self-pay

## 2021-12-14 ENCOUNTER — Ambulatory Visit: Payer: Medicare PPO

## 2021-12-14 DIAGNOSIS — I050 Rheumatic mitral stenosis: Secondary | ICD-10-CM

## 2021-12-14 LAB — POCT INR
INR: 3.5 — AB (ref 2.0–3.0)
PT: 42

## 2021-12-14 NOTE — Patient Instructions (Signed)
Description   Hold one day then continue 5 mg daily. Return 3 weeks.

## 2021-12-20 ENCOUNTER — Ambulatory Visit: Admission: RE | Admit: 2021-12-20 | Payer: Medicare PPO | Source: Home / Self Care | Admitting: Gastroenterology

## 2021-12-20 ENCOUNTER — Encounter: Admission: RE | Payer: Self-pay | Source: Home / Self Care

## 2021-12-20 SURGERY — COLONOSCOPY
Anesthesia: General

## 2022-01-02 ENCOUNTER — Other Ambulatory Visit: Payer: Self-pay | Admitting: Family Medicine

## 2022-01-02 DIAGNOSIS — I050 Rheumatic mitral stenosis: Secondary | ICD-10-CM

## 2022-01-03 ENCOUNTER — Ambulatory Visit (INDEPENDENT_AMBULATORY_CARE_PROVIDER_SITE_OTHER): Payer: Medicare PPO | Admitting: Family Medicine

## 2022-01-03 ENCOUNTER — Other Ambulatory Visit: Payer: Self-pay

## 2022-01-03 ENCOUNTER — Encounter: Payer: Self-pay | Admitting: Family Medicine

## 2022-01-03 VITALS — BP 124/84 | HR 96 | Temp 98.3°F | Resp 16 | Wt 245.0 lb

## 2022-01-03 DIAGNOSIS — E7800 Pure hypercholesterolemia, unspecified: Secondary | ICD-10-CM | POA: Diagnosis not present

## 2022-01-03 DIAGNOSIS — R7989 Other specified abnormal findings of blood chemistry: Secondary | ICD-10-CM | POA: Diagnosis not present

## 2022-01-03 DIAGNOSIS — I480 Paroxysmal atrial fibrillation: Secondary | ICD-10-CM

## 2022-01-03 DIAGNOSIS — Z6841 Body Mass Index (BMI) 40.0 and over, adult: Secondary | ICD-10-CM

## 2022-01-03 DIAGNOSIS — Z96642 Presence of left artificial hip joint: Secondary | ICD-10-CM

## 2022-01-03 DIAGNOSIS — I5032 Chronic diastolic (congestive) heart failure: Secondary | ICD-10-CM | POA: Diagnosis not present

## 2022-01-03 DIAGNOSIS — I10 Essential (primary) hypertension: Secondary | ICD-10-CM

## 2022-01-03 DIAGNOSIS — G40909 Epilepsy, unspecified, not intractable, without status epilepticus: Secondary | ICD-10-CM

## 2022-01-03 DIAGNOSIS — Z952 Presence of prosthetic heart valve: Secondary | ICD-10-CM

## 2022-01-03 DIAGNOSIS — I052 Rheumatic mitral stenosis with insufficiency: Secondary | ICD-10-CM

## 2022-01-03 DIAGNOSIS — I050 Rheumatic mitral stenosis: Secondary | ICD-10-CM | POA: Diagnosis not present

## 2022-01-03 DIAGNOSIS — I890 Lymphedema, not elsewhere classified: Secondary | ICD-10-CM | POA: Diagnosis not present

## 2022-01-03 DIAGNOSIS — Z9889 Other specified postprocedural states: Secondary | ICD-10-CM

## 2022-01-03 LAB — POCT INR
INR: 4.4 — AB (ref 2.0–3.0)
PT: 53

## 2022-01-03 MED ORDER — WARFARIN SODIUM 4 MG PO TABS: ORAL_TABLET | ORAL | 5 refills | Status: DC

## 2022-01-03 NOTE — Progress Notes (Unsigned)
Established patient visit  I,Braileigh Landenberger,acting as a scribe for Wilhemena Durie, MD.,have documented all relevant documentation on the behalf of Wilhemena Durie, MD,as directed by  Wilhemena Durie, MD while in the presence of Wilhemena Durie, MD.   Patient: Victoria Davila   DOB: 1956/05/13   66 y.o. Female  MRN: [id number] Visit Date: 01/03/2022  Today's healthcare provider: Wilhemena Durie, MD   Chief Complaint  Patient presents with   Follow-up   Hypertension   Hyperlipidemia   Subjective    HPI  Hypertension, follow-up  BP Readings from Last 3 Encounters:  01/03/22 124/84  11/01/21 133/88  08/23/21 (!) 149/81   Wt Readings from Last 3 Encounters:  01/03/22 245 lb (111.1 kg)  11/01/21 240 lb (108.9 kg)  08/23/21 243 lb (110.2 kg)     She was last seen for hypertension 5 months ago.  BP at that visit was 103/88. Management since that visit includes; Fairly good blood pressure control metoprolol. She reports good compliance with treatment. She is not having side effects. none She is not exercising. She is adherent to low salt diet.   Outside blood pressures are not checking.  She does not smoke.  Use of agents associated with hypertension: none.   --------------------------------------------------------------------------------------------------- Lipid/Cholesterol, follow-up  Last Lipid Panel: Lab Results  Component Value Date   CHOL 219 (H) 10/12/2021   LDLCALC 119 (H) 10/12/2021   HDL 82 10/12/2021   TRIG 101 10/12/2021    She was last seen for this 5 months ago.  Management since that visit includes; on rosuvastatin.  She reports good compliance with treatment. She is not having side effects. none  She is following a Regular diet. Current exercise: none  Last metabolic panel Lab Results  Component Value Date   GLUCOSE 94 09/01/2021   NA 144 09/01/2021   K 4.4 09/01/2021   BUN 16 09/01/2021   CREATININE 0.98  09/01/2021   EGFR 64 09/01/2021   GFRNONAA 66 12/25/2019   CALCIUM 9.1 09/01/2021   AST 26 10/12/2021   ALT 24 10/12/2021   The 10-year ASCVD risk score (Arnett DK, et al., 2019) is: 7%  --------------------------------------------------------------------------------------------------- Follow up for Status post mitral valve replacement:  The patient was last seen for this 3 weeks ago. Changes made at last visit include; advised to Hold one day then continue 5 mg daily. Return 3 weeks. INR-3.5, PT-42.  She reports good compliance with treatment. She feels that condition is Unchanged. She is not having side effects. none  -----------------------------------------------------------------------------------------   Medications: Outpatient Medications Prior to Visit  Medication Sig   acetaminophen (TYLENOL) 500 MG tablet Take 1,000 mg by mouth every 6 (six) hours as needed for mild pain or moderate pain.   aspirin 81 MG chewable tablet Chew 81 mg by mouth daily.   clotrimazole (CLOTRIMAZOLE ANTI-FUNGAL) 1 % cream Apply 1 application topically 2 (two) times daily.   COVID-19 mRNA Vac-TriS, Pfizer, SUSP injection Inject into the muscle.   DILANTIN 100 MG ER capsule TAKE FOUR CAPSULES BY MOUTH EVERY DAY   metoprolol succinate (TOPROL-XL) 100 MG 24 hr tablet Take 100 mg by mouth daily.   Multiple Vitamin (MULTIVITAMIN WITH MINERALS) TABS tablet Take 1 tablet by mouth at bedtime.   Omega-3 Fatty Acids (FISH OIL) 1200 MG CAPS Take 1 capsule by mouth 2 (two) times daily.    oseltamivir (TAMIFLU) 75 MG capsule Take 1 capsule (75 mg total) by mouth 2 (two)  times daily.   potassium chloride (K-DUR) 10 MEQ tablet Take 3 tablets (30 mEq total) by mouth 2 (two) times daily.   psyllium (REGULOID) 0.52 G capsule Take 0.52 g by mouth 2 (two) times daily.   rosuvastatin (CRESTOR) 20 MG tablet Take 1 tablet (20 mg total) by mouth daily.   Sod Picosulfate-Mag Ox-Cit Acd (CLENPIQ) 10-3.5-12 MG-GM  -GM/160ML SOLN Drink 1st bottle 5pm the night before procedure, drink 2nd bottle 5 hours before arrival to procedure   torsemide (DEMADEX) 20 MG tablet Take 2 tablets (40 mg total) by mouth 2 (two) times daily.   valACYclovir (VALTREX) 1000 MG tablet Take 1 tablet (1,000 mg total) by mouth 2 (two) times daily. For one day.   warfarin (COUMADIN) 3 MG tablet Take 2 tablets (6 mg total) by mouth daily.   warfarin (COUMADIN) 4 MG tablet TAKE ONE TABLET BY MOUTH EVERY DAY WITH A $Remo'3MG'dPUFk$  TABLET   warfarin (COUMADIN) 5 MG tablet TAKE 1 TABLET BY MOUTH DAILY   warfarin (COUMADIN) 6 MG tablet Take 1 tablet (6 mg total) by mouth daily.   No facility-administered medications prior to visit.    Review of Systems  Constitutional:  Negative for appetite change, chills, fatigue and fever.  Respiratory:  Negative for chest tightness and shortness of breath.   Cardiovascular:  Negative for chest pain and palpitations.  Gastrointestinal:  Negative for abdominal pain, nausea and vomiting.  Neurological:  Negative for dizziness and weakness.   {Labs   Heme   Chem   Endocrine   Serology   Results Review (optional):23779}   Objective    BP 124/84 (BP Location: Right Arm, Patient Position: Sitting, Cuff Size: Large)    Pulse 96    Temp 98.3 F (36.8 C) (Temporal)    Resp 16    Wt 245 lb (111.1 kg)    SpO2 95%    BMI 43.40 kg/m  {Show previous vital signs (optional):23777}  Physical Exam  ***  Results for orders placed or performed in visit on 01/03/22  POCT INR  Result Value Ref Range   INR 4.4 (A) 2.0 - 3.0   PT 53.0     Assessment & Plan     ***  No follow-ups on file.      {provider attestation***:1}   Wilhemena Durie, MD  [facility] [phone] (phone) [phone] (fax)  [place]

## 2022-01-03 NOTE — Patient Instructions (Addendum)
GET SHINGLES VACCINE. HOLD DOSE TODAY. THAN RESTART 4 MG ON EVEN DAYS. TAKE 5 MG ON ODD DAYS. RETURN IN  2 WEEKS. ?

## 2022-01-04 ENCOUNTER — Ambulatory Visit: Payer: Medicare PPO

## 2022-01-05 DIAGNOSIS — I10 Essential (primary) hypertension: Secondary | ICD-10-CM | POA: Diagnosis not present

## 2022-01-05 DIAGNOSIS — I050 Rheumatic mitral stenosis: Secondary | ICD-10-CM | POA: Diagnosis not present

## 2022-01-05 DIAGNOSIS — I5032 Chronic diastolic (congestive) heart failure: Secondary | ICD-10-CM | POA: Diagnosis not present

## 2022-01-05 DIAGNOSIS — E7800 Pure hypercholesterolemia, unspecified: Secondary | ICD-10-CM | POA: Diagnosis not present

## 2022-01-05 DIAGNOSIS — R7989 Other specified abnormal findings of blood chemistry: Secondary | ICD-10-CM | POA: Diagnosis not present

## 2022-01-05 DIAGNOSIS — I480 Paroxysmal atrial fibrillation: Secondary | ICD-10-CM | POA: Diagnosis not present

## 2022-01-06 LAB — COMPREHENSIVE METABOLIC PANEL
ALT: 27 IU/L (ref 0–32)
AST: 27 IU/L (ref 0–40)
Albumin/Globulin Ratio: 1.4 (ref 1.2–2.2)
Albumin: 4.3 g/dL (ref 3.8–4.8)
Alkaline Phosphatase: 165 IU/L — ABNORMAL HIGH (ref 44–121)
BUN/Creatinine Ratio: 18 (ref 12–28)
BUN: 18 mg/dL (ref 8–27)
Bilirubin Total: 0.4 mg/dL (ref 0.0–1.2)
CO2: 25 mmol/L (ref 20–29)
Calcium: 9 mg/dL (ref 8.7–10.3)
Chloride: 103 mmol/L (ref 96–106)
Creatinine, Ser: 1.02 mg/dL — ABNORMAL HIGH (ref 0.57–1.00)
Globulin, Total: 3 g/dL (ref 1.5–4.5)
Glucose: 90 mg/dL (ref 70–99)
Potassium: 4.5 mmol/L (ref 3.5–5.2)
Sodium: 147 mmol/L — ABNORMAL HIGH (ref 134–144)
Total Protein: 7.3 g/dL (ref 6.0–8.5)
eGFR: 61 mL/min/{1.73_m2} (ref >59)

## 2022-01-06 LAB — LIPID PANEL
Chol/HDL Ratio: 2.4 ratio (ref 0.0–4.4)
Cholesterol, Total: 209 mg/dL — ABNORMAL HIGH (ref 100–199)
HDL: 87 mg/dL (ref >39)
LDL Chol Calc (NIH): 104 mg/dL — ABNORMAL HIGH (ref 0–99)
Triglycerides: 102 mg/dL (ref 0–149)
VLDL Cholesterol Cal: 18 mg/dL (ref 5–40)

## 2022-01-06 LAB — CBC WITH DIFFERENTIAL/PLATELET
Basophils Absolute: 0.1 10*3/uL (ref 0.0–0.2)
Basos: 1 %
EOS (ABSOLUTE): 0.1 10*3/uL (ref 0.0–0.4)
Eos: 1 %
Hematocrit: 41 % (ref 34.0–46.6)
Hemoglobin: 14.5 g/dL (ref 11.1–15.9)
Immature Grans (Abs): 0 10*3/uL (ref 0.0–0.1)
Immature Granulocytes: 0 %
Lymphocytes Absolute: 2 10*3/uL (ref 0.7–3.1)
Lymphs: 35 %
MCH: 31.5 pg (ref 26.6–33.0)
MCHC: 35.4 g/dL (ref 31.5–35.7)
MCV: 89 fL (ref 79–97)
Monocytes Absolute: 0.5 10*3/uL (ref 0.1–0.9)
Monocytes: 9 %
Neutrophils Absolute: 3 10*3/uL (ref 1.4–7.0)
Neutrophils: 54 %
Platelets: 163 10*3/uL (ref 150–450)
RBC: 4.6 x10E6/uL (ref 3.77–5.28)
RDW: 14.1 % (ref 11.7–15.4)
WBC: 5.6 10*3/uL (ref 3.4–10.8)

## 2022-01-18 ENCOUNTER — Ambulatory Visit (INDEPENDENT_AMBULATORY_CARE_PROVIDER_SITE_OTHER): Payer: Medicare PPO

## 2022-01-18 ENCOUNTER — Other Ambulatory Visit: Payer: Self-pay

## 2022-01-18 DIAGNOSIS — I052 Rheumatic mitral stenosis with insufficiency: Secondary | ICD-10-CM

## 2022-01-18 DIAGNOSIS — I050 Rheumatic mitral stenosis: Secondary | ICD-10-CM | POA: Diagnosis not present

## 2022-01-18 LAB — POCT INR: INR: 2.8 (ref 2.0–3.0)

## 2022-01-18 NOTE — Patient Instructions (Signed)
Description   ?4 mg on even days. Take 5 mg on odd days. Return 3 weeks. ?  ? ? ?

## 2022-02-08 ENCOUNTER — Ambulatory Visit (INDEPENDENT_AMBULATORY_CARE_PROVIDER_SITE_OTHER): Payer: Medicare PPO

## 2022-02-08 DIAGNOSIS — I050 Rheumatic mitral stenosis: Secondary | ICD-10-CM | POA: Diagnosis not present

## 2022-02-08 LAB — POCT INR
INR: 2.6 (ref 2.0–3.0)
PT: 31.5

## 2022-03-01 ENCOUNTER — Ambulatory Visit: Payer: Medicare PPO

## 2022-03-01 DIAGNOSIS — I050 Rheumatic mitral stenosis: Secondary | ICD-10-CM

## 2022-03-01 LAB — POCT INR
INR: 2.5 (ref 2.0–3.0)
PT: 30.2

## 2022-03-13 ENCOUNTER — Other Ambulatory Visit: Payer: Self-pay | Admitting: Family Medicine

## 2022-03-13 DIAGNOSIS — G40909 Epilepsy, unspecified, not intractable, without status epilepticus: Secondary | ICD-10-CM

## 2022-03-14 NOTE — Telephone Encounter (Signed)
Requested medication (s) are due for refill today:   Provider to review ? ?Requested medication (s) are on the active medication list:   yes ? ?Future visit scheduled:   Yes ? ? ?Last ordered: 07/12/2021 #120, 5 refills ? ?Non delegated refill  ? ?Requested Prescriptions  ?Pending Prescriptions Disp Refills  ? DILANTIN 100 MG ER capsule [Pharmacy Med Name: DILANTIN 100 MG CAP] 120 capsule 5  ?  Sig: TAKE FOUR CAPSULES BY MOUTH EVERY DAY  ?  ? Not Delegated - Neurology:  Anticonvulsants - phenytoin Failed - 03/13/2022 12:10 PM  ?  ?  Failed - This refill cannot be delegated  ?  ?  Passed - ALT in normal range and within 360 days  ?  ALT  ?Date Value Ref Range Status  ?01/05/2022 27 0 - 32 IU/L Final  ?   ?  ?  Passed - AST in normal range and within 360 days  ?  AST  ?Date Value Ref Range Status  ?01/05/2022 27 0 - 40 IU/L Final  ?   ?  ?  Passed - HGB in normal range and within 360 days  ?  Hemoglobin  ?Date Value Ref Range Status  ?01/05/2022 14.5 11.1 - 15.9 g/dL Final  ?   ?  ?  Passed - HCT in normal range and within 360 days  ?  Hematocrit  ?Date Value Ref Range Status  ?01/05/2022 41.0 34.0 - 46.6 % Final  ?   ?  ?  Passed - PLT in normal range and within 360 days  ?  Platelets  ?Date Value Ref Range Status  ?01/05/2022 163 150 - 450 x10E3/uL Final  ?   ?  ?  Passed - WBC in normal range and within 360 days  ?  WBC  ?Date Value Ref Range Status  ?01/05/2022 5.6 3.4 - 10.8 x10E3/uL Final  ?10/05/2017 6.2 3.8 - 10.8 Thousand/uL Final  ?   ?  ?  Passed - Phenytoin (serum) in normal range and within 360 days  ?  Phenytoin (Dilantin), Serum  ?Date Value Ref Range Status  ?09/01/2021 12.6 10.0 - 20.0 ug/mL Final  ?  Comment:  ?                                  Detection Limit =  0.8 ?                          <0.8 Indicates None Detected ?  ?03/01/2016 11.7 10.0 - 20.0 ug/mL Final  ?  Comment:  ?                                  Neonatal: ?                                Therapeutic 6.0 - 14.0 ?                                 Detection Limit =  0.8 ?                          <0.8 Indicates None Detected ?  ?   ?  ?  Passed - Completed PHQ-2 or PHQ-9 in the last 360 days  ?  ?  Passed - Patient is not pregnant  ?  ?  Passed - Valid encounter within last 12 months  ?  Recent Outpatient Visits   ? ?      ? 2 months ago Hypercholesteremia  ? [facility] Maple Hudson., MD  ? 4 months ago Viral upper respiratory tract infection  ? [employer], Oswaldo Conroy, PA-C  ? 6 months ago Medicare annual wellness visit, subsequent  ? [facility] Maple Hudson., MD  ? 9 months ago Mitral valve stenosis, unspecified etiology  ? [facility] Maple Hudson., MD  ? 1 year ago Mitral stenosis with insufficiency, unspecified etiology  ? [facility] Maple Hudson., MD  ? ?  ?  ?Future Appointments   ? ?        ? In 3 months Maple Hudson., MD [facility]  ? ?  ? ? ?  ?  ?  ? ?

## 2022-03-22 ENCOUNTER — Ambulatory Visit: Payer: Medicare PPO

## 2022-03-22 DIAGNOSIS — I050 Rheumatic mitral stenosis: Secondary | ICD-10-CM

## 2022-03-22 LAB — POCT INR
INR: 2.7 (ref 2.0–3.0)
PT: 32

## 2022-03-22 NOTE — Patient Instructions (Signed)
Description   Patient is alternating 5mg and 4mg. Return 4 weeks.     

## 2022-04-19 ENCOUNTER — Ambulatory Visit: Payer: Medicare PPO

## 2022-04-19 DIAGNOSIS — I050 Rheumatic mitral stenosis: Secondary | ICD-10-CM | POA: Diagnosis not present

## 2022-04-19 LAB — POCT INR
INR: 2.8 (ref 2.0–3.0)
PT: 33.5

## 2022-04-19 NOTE — Patient Instructions (Signed)
Description   Patient is alternating 5mg  and 4mg . Return 4 weeks.

## 2022-05-17 ENCOUNTER — Ambulatory Visit: Payer: Medicare PPO

## 2022-05-17 DIAGNOSIS — I050 Rheumatic mitral stenosis: Secondary | ICD-10-CM | POA: Diagnosis not present

## 2022-05-17 DIAGNOSIS — I052 Rheumatic mitral stenosis with insufficiency: Secondary | ICD-10-CM

## 2022-05-17 LAB — POCT INR
INR: 3.5 — AB (ref 2.0–3.0)
PT: 41.8

## 2022-05-17 NOTE — Patient Instructions (Addendum)
  Description   Patient is alternating 5mg  and 4mg . Return 3 weeks.   \

## 2022-05-22 DIAGNOSIS — D3131 Benign neoplasm of right choroid: Secondary | ICD-10-CM | POA: Diagnosis not present

## 2022-06-07 ENCOUNTER — Ambulatory Visit (INDEPENDENT_AMBULATORY_CARE_PROVIDER_SITE_OTHER): Payer: Medicare PPO

## 2022-06-07 DIAGNOSIS — I050 Rheumatic mitral stenosis: Secondary | ICD-10-CM

## 2022-06-07 LAB — POCT INR
INR: 2.6 (ref 2.0–3.0)
PT: 31

## 2022-06-07 NOTE — Patient Instructions (Signed)
Description   Patient is alternating 5mg  and 4mg . Repeat during next weeks visit

## 2022-06-09 ENCOUNTER — Other Ambulatory Visit: Payer: Self-pay | Admitting: Family Medicine

## 2022-06-09 DIAGNOSIS — E7800 Pure hypercholesterolemia, unspecified: Secondary | ICD-10-CM

## 2022-06-09 NOTE — Telephone Encounter (Signed)
Sent via Interface. Refills available [facility] 09/14/21 #90 3 refills Requested Prescriptions  Pending Prescriptions Disp Refills  . rosuvastatin (CRESTOR) 20 MG tablet [Pharmacy Med Name: ROSUVASTATIN CALCIUM 20 MG TAB] 90 tablet 3    Sig: TAKE ONE TABLET BY MOUTH EVERY DAY     Cardiovascular:  Antilipid - Statins 2 Failed - 06/09/2022  8:35 AM      Failed - Cr in normal range and within 360 days    Creat  Date Value Ref Range Status  10/05/2017 1.01 (H) 0.50 - 0.99 mg/dL Final    Comment:    For patients >38 years of age, the reference limit for Creatinine is approximately 13% higher for people identified as African-American. .    Creatinine, Ser  Date Value Ref Range Status  01/05/2022 1.02 (H) 0.57 - 1.00 mg/dL Final         Failed - Lipid Panel in normal range within the last 12 months    Cholesterol, Total  Date Value Ref Range Status  01/05/2022 209 (H) 100 - 199 mg/dL Final   LDL Cholesterol (Calc)  Date Value Ref Range Status  10/05/2017 161 (H) mg/dL (calc) Final    Comment:    Reference range: <100 . Desirable range <100 mg/dL for primary prevention;   <70 mg/dL for patients with CHD or diabetic patients  with > or = 2 CHD risk factors. Marland Kitchen LDL-C is now calculated using the Martin-Hopkins  calculation, which is a validated novel method providing  better accuracy than the Friedewald equation in the  estimation of LDL-C.  Horald Pollen et al. Lenox Ahr. 6468;032(12): 2061-2068  (http://education.[employer].com/faq/FAQ164)    LDL Chol Calc (NIH)  Date Value Ref Range Status  01/05/2022 104 (H) 0 - 99 mg/dL Final   HDL  Date Value Ref Range Status  01/05/2022 87 >39 mg/dL Final   Triglycerides  Date Value Ref Range Status  01/05/2022 102 0 - 149 mg/dL Final         Passed - Patient is not pregnant      Passed - Valid encounter within last 12 months    Recent Outpatient Visits          5 months ago Hypercholesteremia   [facility] Maple Hudson., MD   7 months ago Viral upper respiratory tract infection   [facility], Oswaldo Conroy, PA-C   9 months ago [employer] annual wellness visit, subsequent   [facility] Maple Hudson., MD   1 year ago Mitral valve stenosis, unspecified etiology   [facility] Maple Hudson., MD   1 year ago Mitral stenosis with insufficiency, unspecified etiology   [facility] Maple Hudson., MD      Future Appointments            In 4 days Maple Hudson., MD [facility]

## 2022-06-13 ENCOUNTER — Encounter: Payer: Self-pay | Admitting: Family Medicine

## 2022-06-13 ENCOUNTER — Ambulatory Visit: Payer: Medicare PPO | Admitting: Family Medicine

## 2022-06-13 VITALS — BP 130/76 | HR 74 | Resp 16 | Wt 241.0 lb

## 2022-06-13 DIAGNOSIS — Z96642 Presence of left artificial hip joint: Secondary | ICD-10-CM

## 2022-06-13 DIAGNOSIS — Z9889 Other specified postprocedural states: Secondary | ICD-10-CM

## 2022-06-13 DIAGNOSIS — I052 Rheumatic mitral stenosis with insufficiency: Secondary | ICD-10-CM | POA: Diagnosis not present

## 2022-06-13 DIAGNOSIS — I050 Rheumatic mitral stenosis: Secondary | ICD-10-CM

## 2022-06-13 DIAGNOSIS — I480 Paroxysmal atrial fibrillation: Secondary | ICD-10-CM

## 2022-06-13 DIAGNOSIS — I10 Essential (primary) hypertension: Secondary | ICD-10-CM | POA: Diagnosis not present

## 2022-06-13 DIAGNOSIS — M1611 Unilateral primary osteoarthritis, right hip: Secondary | ICD-10-CM

## 2022-06-13 DIAGNOSIS — Z6841 Body Mass Index (BMI) 40.0 and over, adult: Secondary | ICD-10-CM

## 2022-06-13 DIAGNOSIS — G40909 Epilepsy, unspecified, not intractable, without status epilepticus: Secondary | ICD-10-CM

## 2022-06-13 DIAGNOSIS — Z952 Presence of prosthetic heart valve: Secondary | ICD-10-CM | POA: Diagnosis not present

## 2022-06-13 LAB — POCT INR
INR: 2.5 (ref 2.0–3.0)
PT: 30.1

## 2022-06-13 NOTE — Patient Instructions (Signed)
No changes. Continue alternating 5mg  and 4mg . Recheck in 3 weeks.

## 2022-06-13 NOTE — Progress Notes (Unsigned)
Established patient visit  I,April Miller,acting as a scribe for Wilhemena Durie, MD.,have documented all relevant documentation on the behalf of Wilhemena Durie, MD,as directed by  Wilhemena Durie, MD while in the presence of Wilhemena Durie, MD.   Patient: Victoria Davila   DOB: 1956-05-06   66 y.o. Female  MRN: [id number] Visit Date: 06/13/2022  Today's healthcare provider: Wilhemena Durie, MD   Chief Complaint  Patient presents with   Follow-up   Subjective    HPI  Patient comes in today for routine follow-up.  She has had no recent falls but has difficulty moving because of arthritis chronic back pain and obesity. No recent seizures. She is taking medications as prescribed Hypertension, follow-up  BP Readings from Last 3 Encounters:  06/13/22 130/76  01/03/22 124/84  11/01/21 133/88   Wt Readings from Last 3 Encounters:  06/13/22 241 lb (109.3 kg)  01/03/22 245 lb (111.1 kg)  11/01/21 240 lb (108.9 kg)     She was last seen for hypertension 5 months ago.  Management since that visit includes; on metoprolol 100 mg.  Outside blood pressures are not checking.  Pertinent labs Lab Results  Component Value Date   CHOL 209 (H) 01/05/2022   HDL 87 01/05/2022   LDLCALC 104 (H) 01/05/2022   TRIG 102 01/05/2022   CHOLHDL 2.4 01/05/2022   Lab Results  Component Value Date   NA 147 (H) 01/05/2022   K 4.5 01/05/2022   CREATININE 1.02 (H) 01/05/2022   EGFR 61 01/05/2022   GLUCOSE 90 01/05/2022   TSH 3.440 09/01/2021     The 10-year ASCVD risk score (Arnett DK, et al., 2019) is: 7.4%  ---------------------------------------------------------------------------------------------------   Medications: Outpatient Medications Prior to Visit  Medication Sig   acetaminophen (TYLENOL) 500 MG tablet Take 1,000 mg by mouth every 6 (six) hours as needed for mild pain or moderate pain.   aspirin 81 MG chewable tablet Chew 81 mg by mouth daily.    DILANTIN 100 MG ER capsule TAKE FOUR CAPSULES BY MOUTH EVERY DAY   metoprolol succinate (TOPROL-XL) 100 MG 24 hr tablet Take 100 mg by mouth daily.   Multiple Vitamin (MULTIVITAMIN WITH MINERALS) TABS tablet Take 1 tablet by mouth at bedtime.   Omega-3 Fatty Acids (FISH OIL) 1200 MG CAPS Take 1 capsule by mouth 2 (two) times daily.    potassium chloride (K-DUR) 10 MEQ tablet Take 3 tablets (30 mEq total) by mouth 2 (two) times daily.   psyllium (REGULOID) 0.52 G capsule Take 0.52 g by mouth 2 (two) times daily.   rosuvastatin (CRESTOR) 20 MG tablet Take 1 tablet (20 mg total) by mouth daily.   torsemide (DEMADEX) 20 MG tablet Take 2 tablets (40 mg total) by mouth 2 (two) times daily.   warfarin (COUMADIN) 3 MG tablet Take 2 tablets (6 mg total) by mouth daily.   warfarin (COUMADIN) 4 MG tablet TAKE ONE TABLET BY MOUTH EVERY DAY WITH A $Remo'3MG'Unege$  TABLET   COVID-19 mRNA Vac-TriS, Pfizer, SUSP injection Inject into the muscle. (Patient not taking: Reported on 06/13/2022)   valACYclovir (VALTREX) 1000 MG tablet Take 1 tablet (1,000 mg total) by mouth 2 (two) times daily. For one day. (Patient not taking: Reported on 06/13/2022)   warfarin (COUMADIN) 5 MG tablet TAKE 1 TABLET BY MOUTH DAILY (Patient not taking: Reported on 06/13/2022)   warfarin (COUMADIN) 6 MG tablet Take 1 tablet (6 mg total) by mouth daily. (Patient not  taking: Reported on 06/13/2022)   [DISCONTINUED] clotrimazole (CLOTRIMAZOLE ANTI-FUNGAL) 1 % cream Apply 1 application topically 2 (two) times daily. (Patient not taking: Reported on 06/13/2022)   [DISCONTINUED] oseltamivir (TAMIFLU) 75 MG capsule Take 1 capsule (75 mg total) by mouth 2 (two) times daily. (Patient not taking: Reported on 06/13/2022)   [DISCONTINUED] Sod Picosulfate-Mag Ox-Cit Acd (CLENPIQ) 10-3.5-12 MG-GM -GM/160ML SOLN Drink 1st bottle 5pm the night before procedure, drink 2nd bottle 5 hours before arrival to procedure (Patient not taking: Reported on 06/13/2022)   No  facility-administered medications prior to visit.    Review of Systems  Constitutional:  Negative for appetite change, chills, fatigue and fever.  Respiratory:  Negative for chest tightness and shortness of breath.   Cardiovascular:  Negative for chest pain and palpitations.  Gastrointestinal:  Negative for abdominal pain, nausea and vomiting.  Neurological:  Negative for dizziness and weakness.    Last hemoglobin A1c Lab Results  Component Value Date   HGBA1C 5.6 03/01/2016       Objective    BP 130/76 (BP Location: Right Arm, Patient Position: Sitting, Cuff Size: Large)   Pulse 74   Resp 16   Wt 241 lb (109.3 kg)   SpO2 95%   BMI 42.69 kg/m  BP Readings from Last 3 Encounters:  06/13/22 130/76  01/03/22 124/84  11/01/21 133/88   Wt Readings from Last 3 Encounters:  06/13/22 241 lb (109.3 kg)  01/03/22 245 lb (111.1 kg)  11/01/21 240 lb (108.9 kg)      Physical Exam Vitals reviewed.  Constitutional:      General: She is not in acute distress.    Appearance: She is well-developed. She is obese.  HENT:     Head: Normocephalic and atraumatic.     Right Ear: Hearing, tympanic membrane and ear canal normal.     Left Ear: Hearing, tympanic membrane and ear canal normal.     Nose: Nose normal.     Mouth/Throat:     Mouth: Mucous membranes are moist.     Pharynx: Oropharynx is clear. Uvula midline.     Tonsils: No tonsillar exudate or tonsillar abscesses.     Comments: No Gingival hyperplasia Eyes:     General: Lids are normal. No scleral icterus.       Right eye: No discharge.        Left eye: No discharge.     Conjunctiva/sclera: Conjunctivae normal.  Cardiovascular:     Rate and Rhythm: Normal rate and regular rhythm.     Heart sounds: Normal heart sounds.  Pulmonary:     Effort: Pulmonary effort is normal. No respiratory distress.     Breath sounds: Normal breath sounds.  Musculoskeletal:     Cervical back: Normal range of motion and neck supple.      Comments: Chronic lymphedema both lower extremities.   Lymphadenopathy:     Cervical: No cervical adenopathy.  Skin:    General: Skin is warm and dry.     Findings: No lesion or rash.  Neurological:     General: No focal deficit present.     Mental Status: She is alert and oriented to person, place, and time.  Psychiatric:        Mood and Affect: Mood normal.        Speech: Speech normal.        Behavior: Behavior normal.        Thought Content: Thought content normal.  Judgment: Judgment normal.       Results for orders placed or performed in visit on 06/13/22  POCT INR  Result Value Ref Range   INR 2.5 2.0 - 3.0   PT 30.1     Assessment & Plan     1. Cerebral seizure (HCC) Phenytoin level, no recent seizures - Dilantin (Phenytoin) level, total  2. Mitral stenosis with insufficiency, unspecified etiology   3. Mitral valve stenosis, unspecified etiology  - POCT INR  4. Essential (primary) hypertension Good control  5. AF (paroxysmal atrial fibrillation) (HCC) On Coumadin  6. Arthritis of right hip   7. Status post total replacement of left hip   8. H/O mitral valve replacement with mechanical valve INR 2.5-3.5  9. H/O maze procedure   10. Morbid obesity with BMI of 40.0-44.9, adult [facility]) Discussed this at length with patient today.  I think weight watchers would be the best option as it is a lifestyle issue for her.  Return in about 3 months (around 09/13/2022).      I, Wilhemena Durie, MD, have reviewed all documentation for this visit. The documentation on 06/15/22 for the exam, diagnosis, procedures, and orders are all accurate and complete.    Elah Avellino Cranford Mon, MD  [facility] [phone] (phone) [phone] (fax)  [place]

## 2022-06-14 LAB — PHENYTOIN LEVEL, TOTAL: Phenytoin (Dilantin), Serum: 12.3 ug/mL (ref 10.0–20.0)

## 2022-07-05 ENCOUNTER — Ambulatory Visit: Payer: Medicare PPO

## 2022-07-05 ENCOUNTER — Other Ambulatory Visit: Payer: Self-pay | Admitting: Family Medicine

## 2022-07-05 DIAGNOSIS — I050 Rheumatic mitral stenosis: Secondary | ICD-10-CM | POA: Diagnosis not present

## 2022-07-05 LAB — POCT INR
INR: 4.3 — AB (ref 2.0–3.0)
PT: 51.8

## 2022-07-05 NOTE — Patient Instructions (Signed)
Description   Hold for 2 days then 4mg  daily except 5mg  on Tuesday and Saturday. F/u in 2 weeks.

## 2022-07-19 ENCOUNTER — Ambulatory Visit: Payer: Medicare PPO | Admitting: Physician Assistant

## 2022-07-19 DIAGNOSIS — I052 Rheumatic mitral stenosis with insufficiency: Secondary | ICD-10-CM | POA: Diagnosis not present

## 2022-07-19 LAB — POCT INR: INR: 2.8 (ref 2.0–3.0)

## 2022-07-19 NOTE — Patient Instructions (Signed)
Description   Continue 4mg daily except 5mg on Tuesday and Saturday. F/u in 2 weeks.      

## 2022-07-20 DIAGNOSIS — Z952 Presence of prosthetic heart valve: Secondary | ICD-10-CM | POA: Diagnosis not present

## 2022-07-20 DIAGNOSIS — I480 Paroxysmal atrial fibrillation: Secondary | ICD-10-CM | POA: Diagnosis not present

## 2022-08-02 ENCOUNTER — Ambulatory Visit: Payer: Medicare PPO

## 2022-08-02 DIAGNOSIS — I050 Rheumatic mitral stenosis: Secondary | ICD-10-CM | POA: Diagnosis not present

## 2022-08-02 LAB — POCT INR
INR: 3.3 — AB (ref 2.0–3.0)
PT: 39.9

## 2022-08-02 NOTE — Patient Instructions (Signed)
Description   Continue 4mg  daily except 5mg  on Tuesday and Saturday. F/u in 2 weeks.

## 2022-08-23 ENCOUNTER — Ambulatory Visit: Payer: Medicare PPO | Admitting: *Deleted

## 2022-08-23 DIAGNOSIS — Z23 Encounter for immunization: Secondary | ICD-10-CM

## 2022-08-23 DIAGNOSIS — I052 Rheumatic mitral stenosis with insufficiency: Secondary | ICD-10-CM | POA: Diagnosis not present

## 2022-08-23 DIAGNOSIS — I050 Rheumatic mitral stenosis: Secondary | ICD-10-CM

## 2022-08-23 LAB — POCT INR
POC INR: 2.7
PT: 33

## 2022-08-23 MED ORDER — WARFARIN SODIUM 5 MG PO TABS: 5.0000 mg | ORAL_TABLET | Freq: Every day | ORAL | 5 refills | Status: DC

## 2022-08-23 NOTE — Patient Instructions (Signed)
Continue 4mg  daily except 5mg  on Tuesday and Saturday. F/u in 3 weeks.

## 2022-09-01 ENCOUNTER — Other Ambulatory Visit: Payer: Self-pay | Admitting: Family Medicine

## 2022-09-01 DIAGNOSIS — G40909 Epilepsy, unspecified, not intractable, without status epilepticus: Secondary | ICD-10-CM

## 2022-09-01 MED ORDER — DILANTIN 100 MG PO CAPS: ORAL_CAPSULE | ORAL | 5 refills | Status: DC

## 2022-09-11 ENCOUNTER — Telehealth: Payer: Self-pay | Admitting: Family Medicine

## 2022-09-11 DIAGNOSIS — E7800 Pure hypercholesterolemia, unspecified: Secondary | ICD-10-CM

## 2022-09-11 MED ORDER — ROSUVASTATIN CALCIUM 20 MG PO TABS: 20.0000 mg | ORAL_TABLET | Freq: Every day | ORAL | 3 refills | Status: DC

## 2022-09-11 NOTE — Telephone Encounter (Signed)
[facility] pharmacy faxed refill request for the following medications:   rosuvastatin (CRESTOR) 20 MG tablet     Please advise

## 2022-09-13 ENCOUNTER — Ambulatory Visit: Payer: Medicare PPO

## 2022-09-13 ENCOUNTER — Ambulatory Visit: Payer: Medicare PPO | Admitting: Family Medicine

## 2022-09-13 ENCOUNTER — Ambulatory Visit: Payer: Medicare PPO | Admitting: Physician Assistant

## 2022-09-13 ENCOUNTER — Encounter: Payer: Self-pay | Admitting: Family Medicine

## 2022-09-13 VITALS — BP 127/82 | HR 74 | Temp 98.0°F | Resp 16 | Wt 238.7 lb

## 2022-09-13 DIAGNOSIS — I050 Rheumatic mitral stenosis: Secondary | ICD-10-CM | POA: Diagnosis not present

## 2022-09-13 DIAGNOSIS — G40909 Epilepsy, unspecified, not intractable, without status epilepticus: Secondary | ICD-10-CM

## 2022-09-13 DIAGNOSIS — I5032 Chronic diastolic (congestive) heart failure: Secondary | ICD-10-CM

## 2022-09-13 DIAGNOSIS — I052 Rheumatic mitral stenosis with insufficiency: Secondary | ICD-10-CM | POA: Diagnosis not present

## 2022-09-13 LAB — POCT INR
INR: 3.1 — AB (ref 2.0–3.0)
PT: 37.2

## 2022-09-13 NOTE — Patient Instructions (Addendum)
It was a pleasure to see you today!  Thank you for choosing [facility] for your primary care.   Victoria Davila was seen for seizure follow up.   Our plans for today were: Please continue your Dilantin as prescribed along with your Coumadin 4 & 5mg  regimen  Your INR was within goal range for therapy, 3.1  We placed a referral to have you establish with a neurologist. Please notify our office if you do not hear anything after two weeks in regards to an appointment with the specialist.   To keep you healthy, please keep in mind the following health maintenance items that you are due for:   Shingrix vaccine  Pneumonia Vaccine  Tetanus Vaccine  Colonoscopy    You should return to our clinic in 6 months for follow up seizure medications and in 2024 for Annual Wellness Visit.   Best Wishes,   Dr. 2025

## 2022-09-13 NOTE — Progress Notes (Signed)
I,Joseline E Rosas,acting as a scribe for [employer], MD.,have documented all relevant documentation on the behalf of Ronnald Ramp, MD,as directed by  Ronnald Ramp, MD while in the presence of Ronnald Ramp, MD.   Established patient visit   Patient: Victoria Davila   DOB: 1956-03-07   66 y.o. Female  MRN: [id number] Visit Date: 09/13/2022  Today's healthcare provider: Ronnald Ramp, MD   Chief Complaint  Patient presents with   Follow-up   Subjective    HPI   Cerebral Seizures Patient here to follow-up on cerebral Seizure. She is currently taking Dilantin 100 mg. She is due to have labs checked on this. Patient presents with her husband who helps with managing her medications. Her last seizure was in 1995. She is regularly adherent to her AED regimen and denies missing doses or having adverse effects of the medication. She does not currently see a neurologist and has had her medications managed by Dr. Sullivan Lone for several years. They are amenable to having specialist consultation to see if she would be appropriate for any different medications.    Mitral Regurgitation and Mitral stenosis:  Patient presents for for INR check. Target Range is 2.5-3.5.  Patient currently on Warfarin 4 mg daily except 5 mg on Tuesday and Saturdays. Reports regular adherence to this regimen without missed doses  Lab Results  Component Value Date   INR 3.1 (A) 09/13/2022   INR 2.7 08/23/2022   INR 3.3 (A) 08/02/2022     Medications: Outpatient Medications Prior to Visit  Medication Sig Note   acetaminophen (TYLENOL) 325 MG tablet Take 1,000 mg by mouth every 6 (six) hours as needed for mild pain or moderate pain.    DILANTIN 100 MG ER capsule TAKE FOUR CAPSULES BY MOUTH EVERY DAY    metoprolol succinate (TOPROL-XL) 100 MG 24 hr tablet Take 100 mg by mouth daily.    Multiple Vitamin (MULTIVITAMIN WITH MINERALS) TABS tablet Take 1  tablet by mouth at bedtime.    Omega-3 Fatty Acids (FISH OIL) 1200 MG CAPS Take 1 capsule by mouth 2 (two) times daily.     potassium chloride (K-DUR) 10 MEQ tablet Take 3 tablets (30 mEq total) by mouth 2 (two) times daily.    psyllium (REGULOID) 0.52 G capsule Take 0.52 g by mouth 2 (two) times daily.    rosuvastatin (CRESTOR) 20 MG tablet Take 1 tablet (20 mg total) by mouth daily.    torsemide (DEMADEX) 20 MG tablet Take 2 tablets (40 mg total) by mouth 2 (two) times daily.    valACYclovir (VALTREX) 1000 MG tablet Take 1 tablet (1,000 mg total) by mouth 2 (two) times daily. For one day.    warfarin (COUMADIN) 4 MG tablet TAKE ONE TABLET BY MOUTH EVERY DAY WITH A 3MG  TABLET    warfarin (COUMADIN) 5 MG tablet Take 1 tablet (5 mg total) by mouth daily. 09/13/2022: Tues and Saturdays   [DISCONTINUED] aspirin 81 MG chewable tablet Chew 81 mg by mouth daily. (Patient not taking: Reported on 09/13/2022)    [DISCONTINUED] COVID-19 mRNA Vac-TriS, Pfizer, SUSP injection Inject into the muscle.    [DISCONTINUED] warfarin (COUMADIN) 3 MG tablet Take 2 tablets (6 mg total) by mouth daily. (Patient not taking: Reported on 09/13/2022)    [DISCONTINUED] warfarin (COUMADIN) 6 MG tablet Take 1 tablet (6 mg total) by mouth daily. (Patient not taking: Reported on 06/13/2022)    No facility-administered medications prior to visit.    Review of Systems  Objective    BP 127/82 (BP Location: Right Arm, Patient Position: Sitting, Cuff Size: Large)   Pulse 74   Temp 98 F (36.7 C) (Oral)   Resp 16   Wt 238 lb 11.2 oz (108.3 kg)   BMI 42.28 kg/m    Physical Exam Vitals reviewed.  Constitutional:      General: She is not in acute distress.    Appearance: Normal appearance. She is not ill-appearing, toxic-appearing or diaphoretic.  Eyes:     Conjunctiva/sclera: Conjunctivae normal.  Cardiovascular:     Rate and Rhythm: Normal rate and regular rhythm.     Pulses: Normal pulses.     Heart sounds:  Murmur heard.     No friction rub. No gallop.     Comments: Mitral valve click  Pulmonary:     Effort: Pulmonary effort is normal. No respiratory distress.     Breath sounds: Normal breath sounds. No stridor. No wheezing, rhonchi or rales.  Abdominal:     General: Bowel sounds are normal. There is no distension.     Palpations: Abdomen is soft.     Tenderness: There is no abdominal tenderness.  Musculoskeletal:     Right lower leg: Edema present.     Left lower leg: Edema present.  Skin:    Findings: No erythema or rash.  Neurological:     Mental Status: She is alert and oriented to person, place, and time.       Results for orders placed or performed in visit on 09/13/22  Phenytoin level, free and total  Result Value Ref Range   Phenytoin, Serum 17.4 10.0 - 20.0 ug/mL   Phenytoin, Free 1.4 1.0 - 2.0 ug/mL  POCT INR  Result Value Ref Range   INR 3.1 (A) 2.0 - 3.0   PT 37.2     Assessment & Plan     Problem List Items Addressed This Visit       Cardiovascular and Mediastinum   Mitral regurgitation and mitral stenosis    Stable  Followed by Dr. Regino Schultze, last OV was in Sept 2023  She will continue warfarin 4mg  and 5mg  on Tues/Sat  Tolerating medications well  INR within therapeutic range today        Chronic diastolic heart failure, NYHA class 2 (HCC)    Stable  Continue statin, metoprolol and torsemide at current doses  No symptoms today  Continues to wear compression socks for LE edema  Follow up with cardiology as scheduled          Nervous and Auditory   Cerebral seizure (HCC) - Primary    Stable  No breakthrough seizures on Dilantan 100mg  daily  Phenytoin levels collected today  She will continue medication at current dose  Recommended neurology referral since she has been on this medication for several years and for her to establish with neurology for consideration of different agents vs continued dilantin  Both she had her spouse were in agreement with  this referral and plan        Relevant Orders   Ambulatory referral to Neurology   Phenytoin level, free and total (Completed)   Other Visit Diagnoses     Mitral valve stenosis, unspecified etiology            Return in about 3 weeks (around 10/04/2022) for INR.     I, , MD, have reviewed all documentation for this visit. The documentation on 09/16/22 for the exam, diagnosis, procedures, and orders are  all accurate and complete.  Portions of this information were initially documented by the CMA and reviewed by me for thoroughness and accuracy.      Ronnald Ramp, MD  [facility] [phone] (phone) [phone] (fax)  [facility]

## 2022-09-14 LAB — PHENYTOIN LEVEL, FREE AND TOTAL
Phenytoin, Free: 1.4 ug/mL (ref 1.0–2.0)
Phenytoin, Serum: 17.4 ug/mL (ref 10.0–20.0)

## 2022-09-16 NOTE — Assessment & Plan Note (Signed)
Stable  Followed by Dr. Regino Schultze, last OV was in Sept 2023  She will continue warfarin 4mg  and 5mg  on Tues/Sat  Tolerating medications well  INR within therapeutic range today

## 2022-09-16 NOTE — Assessment & Plan Note (Signed)
Stable  Continue statin, metoprolol and torsemide at current doses  No symptoms today  Continues to wear compression socks for LE edema  Follow up with cardiology as scheduled

## 2022-09-16 NOTE — Assessment & Plan Note (Signed)
Stable  No breakthrough seizures on Dilantan 100mg  daily  Phenytoin levels collected today  She will continue medication at current dose  Recommended neurology referral since she has been on this medication for several years and for her to establish with neurology for consideration of different agents vs continued dilantin  Both she had her spouse were in agreement with this referral and plan

## 2022-09-19 ENCOUNTER — Other Ambulatory Visit: Payer: Self-pay | Admitting: Family Medicine

## 2022-09-19 DIAGNOSIS — I480 Paroxysmal atrial fibrillation: Secondary | ICD-10-CM

## 2022-09-19 MED ORDER — WARFARIN SODIUM 4 MG PO TABS: ORAL_TABLET | ORAL | 1 refills | Status: DC

## 2022-09-19 NOTE — Telephone Encounter (Signed)
[facility] faxed refill request for the following medications:   warfarin (COUMADIN) 4 MG tablet    Please advise

## 2022-09-29 ENCOUNTER — Telehealth: Payer: Self-pay | Admitting: Family Medicine

## 2022-09-29 NOTE — Telephone Encounter (Signed)
Left message for patient to call back and schedule Medicare Annual Wellness Visit (AWV) in office.   If not able to come in office, please offer to do virtually or by telephone.  Left office number and my jabber [phone].  Last AWV:08/23/2021   Please schedule at anytime with Nurse Health Advisor.

## 2022-10-04 ENCOUNTER — Ambulatory Visit: Payer: Medicare PPO

## 2022-10-04 DIAGNOSIS — I052 Rheumatic mitral stenosis with insufficiency: Secondary | ICD-10-CM

## 2022-10-04 LAB — POCT INR: INR: 2.7 (ref 2.0–3.0)

## 2022-10-06 ENCOUNTER — Other Ambulatory Visit: Payer: Self-pay

## 2022-10-06 MED ORDER — COVID-19 MRNA VAC-TRIS(PFIZER) 30 MCG/0.3ML IM SUSY
0.3000 mL | PREFILLED_SYRINGE | Freq: Once | INTRAMUSCULAR | 0 refills | Status: AC
  Filled 2022-10-10 (×2): qty 0.3, 1d supply, fill #0

## 2022-10-10 ENCOUNTER — Other Ambulatory Visit: Payer: Self-pay

## 2022-10-16 ENCOUNTER — Ambulatory Visit (INDEPENDENT_AMBULATORY_CARE_PROVIDER_SITE_OTHER): Payer: Medicare PPO

## 2022-10-16 VITALS — Wt 238.0 lb

## 2022-10-16 DIAGNOSIS — Z0000 Encounter for general adult medical examination without abnormal findings: Secondary | ICD-10-CM | POA: Diagnosis not present

## 2022-10-16 NOTE — Progress Notes (Signed)
Virtual Visit via Telephone Note  I connected with  Victoria Davila on 10/16/22 at  2:30 PM EST by telephone and verified that I am speaking with the correct person using two identifiers.  Location: Patient: home Provider: [facility] Persons participating in the virtual visit: patient/Nurse Health Advisor   I discussed the limitations, risks, security and privacy concerns of performing an evaluation and management service by telephone and the availability of in person appointments. The patient expressed understanding and agreed to proceed.  Interactive audio and video telecommunications were attempted between this nurse and patient, however failed, due to patient having technical difficulties OR patient did not have access to video capability.  We continued and completed visit with audio only.  Some vital signs may be absent or patient reported.   Hal Hope, LPN  Subjective:   Victoria Davila is a 66 y.o. female who presents for an Initial Medicare Annual Wellness Visit.  Review of Systems     Cardiac Risk Factors include: advanced age (>68men, >92 women);hypertension;dyslipidemia     Objective:    There were no vitals filed for this visit. There is no height or weight on file to calculate BMI.     10/16/2022    2:35 PM 01/03/2018    3:11 PM 11/06/2014    1:28 PM 10/29/2014    8:28 AM  Advanced Directives  Does Patient Have a Medical Advance Directive? No No Yes Yes  Type of Surveyor, minerals;Living will Living will;Healthcare Power of Attorney  Does patient want to make changes to medical advance directive?   No - Patient declined   Copy of Healthcare Power of Attorney in Chart?   No - copy requested No - copy requested  Would patient like information on creating a medical advance directive? No - Patient declined No - Patient declined      Current Medications (verified) Outpatient Encounter Medications as of 10/16/2022  Medication  Sig   acetaminophen (TYLENOL) 325 MG tablet Take 1,000 mg by mouth every 6 (six) hours as needed for mild pain or moderate pain.   DILANTIN 100 MG ER capsule TAKE FOUR CAPSULES BY MOUTH EVERY DAY   metoprolol succinate (TOPROL-XL) 100 MG 24 hr tablet Take 100 mg by mouth daily.   Multiple Vitamin (MULTIVITAMIN WITH MINERALS) TABS tablet Take 1 tablet by mouth at bedtime.   Omega-3 Fatty Acids (FISH OIL) 1200 MG CAPS Take 1 capsule by mouth 2 (two) times daily.    potassium chloride (K-DUR) 10 MEQ tablet Take 3 tablets (30 mEq total) by mouth 2 (two) times daily.   psyllium (REGULOID) 0.52 G capsule Take 0.52 g by mouth 2 (two) times daily.   rosuvastatin (CRESTOR) 20 MG tablet Take 1 tablet (20 mg total) by mouth daily.   torsemide (DEMADEX) 20 MG tablet Take 2 tablets (40 mg total) by mouth 2 (two) times daily.   valACYclovir (VALTREX) 1000 MG tablet Take 1 tablet (1,000 mg total) by mouth 2 (two) times daily. For one day.   warfarin (COUMADIN) 4 MG tablet TAKE ONE TABLET BY MOUTH EVERY DAY WITH A 3MG  TABLET   warfarin (COUMADIN) 5 MG tablet Take 1 tablet (5 mg total) by mouth daily.   No facility-administered encounter medications on file as of 10/16/2022.    Allergies (verified) Patient has no known allergies.   History: Past Medical History:  Diagnosis Date   Dysrhythmia    A FIB / FOLLOWED BY DR 10/18/2022  AT [facility]   Edema of both legs    CHRONIC   Hyperlipidemia    Hypertension    Mitral valve disorder    "deformed"   Neuromuscular disorder (HCC)    Rheumatoid arthritis (HCC)    RECENT DX - HAS NOT YET SEEN RHEUMATOLOGIST   Seizures (HCC)    LAST SEIZURE JAN 1995   Shortness of breath dyspnea    WITH EXERTION   Past Surgical History:  Procedure Laterality Date   ABDOMINAL HYSTERECTOMY  2012   CARDIAC ELECTROPHYSIOLOGY MAPPING AND ABLATION     CARDIAC VALVE SURGERY  2001   "heart valve stretched" for mitral valve stenosis   CERVICAL POLYPECTOMY     KNEE ARTHROSCOPY   2012   left   TOTAL HIP ARTHROPLASTY Left 11/06/2014   Procedure: LEFT TOTAL HIP ARTHROPLASTY ANTERIOR APPROACH;  Surgeon: Kathryne Hitch, MD;  Location: [facility];  Service: Orthopedics;  Laterality: Left;   Family History  Problem Relation Age of Onset   Diabetes Mother    Hip fracture Mother    Arthritis Father    Dementia Father    Cancer Sister        Thyroid Cancer s/p Thyroidectomy   Lung disease Paternal Grandfather    Social History   Socioeconomic History   Marital status: Married    Spouse name: Not on file   Number of children: Not on file   Years of education: Not on file   Highest education level: Not on file  Occupational History   Not on file  Tobacco Use   Smoking status: Never   Smokeless tobacco: Never  Vaping Use   Vaping Use: Never used  Substance and Sexual Activity   Alcohol use: No    Comment: OCCASIONAL WINE ONLY   Drug use: No   Sexual activity: Not on file  Other Topics Concern   Not on file  Social History Narrative   Not on file   Social Determinants of Health   Financial Resource Strain: Low Risk  (10/16/2022)   Overall Financial Resource Strain (CARDIA)    Difficulty of Paying Living Expenses: Not hard at all  Food Insecurity: No Food Insecurity (10/16/2022)   Hunger Vital Sign    Worried About Running Out of Food in the Last Year: Never true    Ran Out of Food in the Last Year: Never true  Transportation Needs: No Transportation Needs (10/16/2022)   PRAPARE - Administrator, Civil Service (Medical): No    Lack of Transportation (Non-Medical): No  Physical Activity: Inactive (10/16/2022)   Exercise Vital Sign    Days of Exercise per Week: 0 days    Minutes of Exercise per Session: 0 min  Stress: No Stress Concern Present (10/16/2022)   [employer] of Occupational Health - Occupational Stress Questionnaire    Feeling of Stress : Not at all  Social Connections: Moderately Isolated (10/16/2022)   Social  Connection and Isolation Panel [NHANES]    Frequency of Communication with Friends and Family: Three times a week    Frequency of Social Gatherings with Friends and Family: Once a week    Attends Religious Services: Never    Database administrator or Organizations: No    Attends Engineer, structural: Never    Marital Status: Married    Tobacco Counseling Counseling given: Not Answered   Clinical Intake:  Pre-visit preparation completed: Yes  Pain : No/denies pain     Nutritional Risks:  None Diabetes: No  How often do you need to have someone help you when you read instructions, pamphlets, or other written materials from your doctor or pharmacy?: 1 - Never  Diabetic?no  Interpreter Needed?: No  Information entered by :: Kennedy Bucker, LPN   Activities of Daily Living    10/16/2022    2:36 PM  In your present state of health, do you have any difficulty performing the following activities:  Hearing? 0  Vision? 0  Difficulty concentrating or making decisions? 0  Walking or climbing stairs? 1  Dressing or bathing? 0  Doing errands, shopping? 0  Preparing Food and eating ? N  Using the Toilet? N  In the past six months, have you accidently leaked urine? N  Do you have problems with loss of bowel control? N  Managing your Medications? N  Managing your Finances? N  Housekeeping or managing your Housekeeping? N    Patient Care Team: Ronnald Ramp, MD as PCP - General (Family Medicine)  Indicate any recent Medical Services you may have received from other than [facility] providers in the past year (date may be approximate).     Assessment:   This is a routine wellness examination for Victoria Davila.  Hearing/Vision screen Hearing Screening - Comments:: No aids Vision Screening - Comments:: Wears glasses-  Eye  Dietary issues and exercise activities discussed: Current Exercise Habits: The patient does not participate in regular exercise at  present   Goals Addressed             This Visit's Progress    DIET - EAT MORE FRUITS AND VEGETABLES         Depression Screen    10/16/2022    2:33 PM 09/13/2022    9:53 AM 08/23/2021   10:11 AM 02/23/2021   11:41 AM 01/27/2020   10:38 AM 03/14/2018   12:03 PM 01/03/2018    3:11 PM  PHQ 2/9 Scores  PHQ - 2 Score 0 0 0 0 1 0 0  PHQ- 9 Score 0  1 1 5 2 3     Fall Risk    10/16/2022    2:36 PM 09/13/2022    9:52 AM 08/23/2021   10:11 AM 02/23/2021   11:40 AM 06/15/2020    9:20 AM  Fall Risk   Falls in the past year? 0 0 0 1 0  Number falls in past yr: 0 0 0 0 0  Injury with Fall? 0 0 0 1 0  Risk for fall due to : No Fall Risks No Fall Risks  History of fall(s);Impaired balance/gait;Impaired mobility;Orthopedic patient   Follow up Falls prevention discussed;Falls evaluation completed        FALL RISK PREVENTION PERTAINING TO THE HOME:  Any stairs in or around the home? Yes  If so, are there any without handrails? No  Home free of loose throw rugs in walkways, pet beds, electrical cords, etc? Yes  Adequate lighting in your home to reduce risk of falls? Yes   ASSISTIVE DEVICES UTILIZED TO PREVENT FALLS:  Life alert? No  Use of a cane, walker or w/c? Yes  Grab bars in the bathroom? Yes  Shower chair or bench in shower? Yes  Elevated toilet seat or a handicapped toilet? Yes    Cognitive Function:        10/16/2022    2:39 PM  6CIT Screen  What Year? 0 points  What month? 0 points  What time? 0 points  Count back from 20  0 points  Months in reverse 0 points  Repeat phrase 4 points  Total Score 4 points    Immunizations Immunization History  Administered Date(s) Administered   COVID-19, mRNA, vaccine(Comirnaty)12 years and older 10/10/2022   Fluad Quad(high Dose 65+) 08/18/2020, 08/10/2021, 08/23/2022   H1N1 08/28/2008   Influenza,inj,Quad PF,6+ Mos 08/11/2016, 08/01/2017, 08/28/2018, 07/08/2019   Influenza-Unspecified 08/11/2013   PFIZER  Comirnaty(Gray Top)Covid-19 Tri-Sucrose Vaccine 05/17/2021   PFIZER(Purple Top)SARS-COV-2 Vaccination 12/24/2019, 01/14/2020, 08/02/2020   Tdap 07/02/2008, 11/02/2010    TDAP status: Due, Education has been provided regarding the importance of this vaccine. Advised may receive this vaccine at local pharmacy or Health Dept. Aware to provide a copy of the vaccination record if obtained from local pharmacy or Health Dept. Verbalized acceptance and understanding.  Flu Vaccine status: Up to date  Pneumococcal vaccine status: Declined,  Education has been provided regarding the importance of this vaccine but patient still declined. Advised may receive this vaccine at local pharmacy or Health Dept. Aware to provide a copy of the vaccination record if obtained from local pharmacy or Health Dept. Verbalized acceptance and understanding.   Covid-19 vaccine status: Completed vaccines  Qualifies for Shingles Vaccine? Yes   Zostavax completed No   Shingrix Completed?: No.    Education has been provided regarding the importance of this vaccine. Patient has been advised to call insurance company to determine out of pocket expense if they have not yet received this vaccine. Advised may also receive vaccine at local pharmacy or Health Dept. Verbalized acceptance and understanding.  Screening Tests Health Maintenance  Topic Date Due   Zoster Vaccines- Shingrix (1 of 2) Never done   Pneumonia Vaccine 75+ Years old (1 - PCV) Never done   DTaP/Tdap/Td (3 - Td or Tdap) 11/02/2020   COLONOSCOPY (Pts 45-26yrs Insurance coverage will need to be confirmed)  05/14/2021   MAMMOGRAM  10/06/2022   Medicare Annual Wellness (AWV)  10/17/2023   INFLUENZA VACCINE  Completed   DEXA SCAN  Completed   COVID-19 Vaccine  Completed   Hepatitis C Screening  Completed   HPV VACCINES  Aged Out    Health Maintenance  Health Maintenance Due  Topic Date Due   Zoster Vaccines- Shingrix (1 of 2) Never done   Pneumonia Vaccine  67+ Years old (1 - PCV) Never done   DTaP/Tdap/Td (3 - Td or Tdap) 11/02/2020   COLONOSCOPY (Pts 45-106yrs Insurance coverage will need to be confirmed)  05/14/2021   MAMMOGRAM  10/06/2022    Colorectal cancer screening: Type of screening: Cologuard. Completed 12/04/21. Repeat every 3 years  Mammogram status: Completed 10/06/21. Repeat every year-will schedule one after Christmas  Bone Density status: Completed 09/20/21. Results reflect: Bone density results: NORMAL. Repeat every 5 years.  Lung Cancer Screening: (Low Dose CT Chest recommended if Age 54-80 years, 30 pack-year currently smoking OR have quit w/in 15years.) does not qualify.   Additional Screening:  Hepatitis C Screening: does qualify; Completed 04/04/12  Vision Screening: Recommended annual ophthalmology exams for early detection of glaucoma and other disorders of the eye. Is the patient up to date with their annual eye exam?  Yes  Who is the provider or what is the name of the office in which the patient attends annual eye exams? [facility] If pt is not established with a provider, would they like to be referred to a provider to establish care? No .   Dental Screening: Recommended annual dental exams for proper oral hygiene  [facility]  Referral / Chronic Care Management: CRR required this visit?  No   CCM required this visit?  No      Plan:     I have personally reviewed and noted the following in the patient's chart:   Medical and social history Use of alcohol, tobacco or illicit drugs  Current medications and supplements including opioid prescriptions. Patient is not currently taking opioid prescriptions. Functional ability and status Nutritional status Physical activity Advanced directives List of other physicians Hospitalizations, surgeries, and ER visits in previous 12 months Vitals Screenings to include cognitive, depression, and falls Referrals and appointments  In addition, I have reviewed  and discussed with patient certain preventive protocols, quality metrics, and best practice recommendations. A written personalized care plan for preventive services as well as general preventive health recommendations were provided to patient.     Hal Hope, LPN   [id number]   Nurse Notes: none

## 2022-10-16 NOTE — Patient Instructions (Signed)
[name] , Thank you for taking time to come for your Medicare Wellness Visit. I appreciate your ongoing commitment to your health goals. Please review the following plan we discussed and let me know if I can assist you in the future.   Screening recommendations/referrals: Colonoscopy: Cologuard 12/04/21 Mammogram: 10/06/21, will schedule appt after Christmas Bone Density: 09/20/21 Recommended yearly ophthalmology/optometry visit for glaucoma screening and checkup Recommended yearly dental visit for hygiene and checkup  Vaccinations: Influenza vaccine: 08/23/22 Pneumococcal vaccine: n/d Tdap vaccine: 11/02/10, due if have injury Shingles vaccine: n/d   Covid-19:12/24/19, 01/14/20, 08/02/20, 05/17/21, 10/10/22  Advanced directives: no  Conditions/risks identified: none  Next appointment: Follow up in one year for your annual wellness visit 10/18/23 @ 9:30 am by phone   Preventive Care 65 Years and Older, Female Preventive care refers to lifestyle choices and visits with your health care provider that can promote health and wellness. What does preventive care include? A yearly physical exam. This is also called an annual well check. Dental exams once or twice a year. Routine eye exams. Ask your health care provider how often you should have your eyes checked. Personal lifestyle choices, including: Daily care of your teeth and gums. Regular physical activity. Eating a healthy diet. Avoiding tobacco and drug use. Limiting alcohol use. Practicing safe sex. Taking low-dose aspirin every day. Taking vitamin and mineral supplements as recommended by your health care provider. What happens during an annual well check? The services and screenings done by your health care provider during your annual well check will depend on your age, overall health, lifestyle risk factors, and family history of disease. Counseling  Your health care provider may ask you questions about your: Alcohol  use. Tobacco use. Drug use. Emotional well-being. Home and relationship well-being. Sexual activity. Eating habits. History of falls. Memory and ability to understand (cognition). Work and work Astronomer. Reproductive health. Screening  You may have the following tests or measurements: Height, weight, and BMI. Blood pressure. Lipid and cholesterol levels. These may be checked every 5 years, or more frequently if you are over 62 years old. Skin check. Lung cancer screening. You may have this screening every year starting at age 75 if you have a 30-pack-year history of smoking and currently smoke or have quit within the past 15 years. Fecal occult blood test (FOBT) of the stool. You may have this test every year starting at age 72. Flexible sigmoidoscopy or colonoscopy. You may have a sigmoidoscopy every 5 years or a colonoscopy every 10 years starting at age 93. Hepatitis C blood test. Hepatitis B blood test. Sexually transmitted disease (STD) testing. Diabetes screening. This is done by checking your blood sugar (glucose) after you have not eaten for a while (fasting). You may have this done every 1-3 years. Bone density scan. This is done to screen for osteoporosis. You may have this done starting at age 17. Mammogram. This may be done every 1-2 years. Talk to your health care provider about how often you should have regular mammograms. Talk with your health care provider about your test results, treatment options, and if necessary, the need for more tests. Vaccines  Your health care provider may recommend certain vaccines, such as: Influenza vaccine. This is recommended every year. Tetanus, diphtheria, and acellular pertussis (Tdap, Td) vaccine. You may need a Td booster every 10 years. Zoster vaccine. You may need this after age 64. Pneumococcal 13-valent conjugate (PCV13) vaccine. One dose is recommended after age 45. Pneumococcal polysaccharide (PPSV23) vaccine.  One dose is  recommended after age 36. Talk to your health care provider about which screenings and vaccines you need and how often you need them. This information is not intended to replace advice given to you by your health care provider. Make sure you discuss any questions you have with your health care provider. Document Released: 11/12/2015 Document Revised: 07/05/2016 Document Reviewed: 08/17/2015 Elsevier Interactive Patient Education  2017 [employer] Prevention in the Home Falls can cause injuries. They can happen to people of all ages. There are many things you can do to make your home safe and to help prevent falls. What can I do on the outside of my home? Regularly fix the edges of walkways and driveways and fix any cracks. Remove anything that might make you trip as you walk through a door, such as a raised step or threshold. Trim any bushes or trees on the path to your home. Use bright outdoor lighting. Clear any walking paths of anything that might make someone trip, such as rocks or tools. Regularly check to see if handrails are loose or broken. Make sure that both sides of any steps have handrails. Any raised decks and porches should have guardrails on the edges. Have any leaves, snow, or ice cleared regularly. Use sand or salt on walking paths during winter. Clean up any spills in your garage right away. This includes oil or grease spills. What can I do in the bathroom? Use night lights. Install grab bars by the toilet and in the tub and shower. Do not use towel bars as grab bars. Use non-skid mats or decals in the tub or shower. If you need to sit down in the shower, use a plastic, non-slip stool. Keep the floor dry. Clean up any water that spills on the floor as soon as it happens. Remove soap buildup in the tub or shower regularly. Attach bath mats securely with double-sided non-slip rug tape. Do not have throw rugs and other things on the floor that can make you  trip. What can I do in the bedroom? Use night lights. Make sure that you have a light by your bed that is easy to reach. Do not use any sheets or blankets that are too big for your bed. They should not hang down onto the floor. Have a firm chair that has side arms. You can use this for support while you get dressed. Do not have throw rugs and other things on the floor that can make you trip. What can I do in the kitchen? Clean up any spills right away. Avoid walking on wet floors. Keep items that you use a lot in easy-to-reach places. If you need to reach something above you, use a strong step stool that has a grab bar. Keep electrical cords out of the way. Do not use floor polish or wax that makes floors slippery. If you must use wax, use non-skid floor wax. Do not have throw rugs and other things on the floor that can make you trip. What can I do with my stairs? Do not leave any items on the stairs. Make sure that there are handrails on both sides of the stairs and use them. Fix handrails that are broken or loose. Make sure that handrails are as long as the stairways. Check any carpeting to make sure that it is firmly attached to the stairs. Fix any carpet that is loose or worn. Avoid having throw rugs at the top or bottom of  the stairs. If you do have throw rugs, attach them to the floor with carpet tape. Make sure that you have a light switch at the top of the stairs and the bottom of the stairs. If you do not have them, ask someone to add them for you. What else can I do to help prevent falls? Wear shoes that: Do not have high heels. Have rubber bottoms. Are comfortable and fit you well. Are closed at the toe. Do not wear sandals. If you use a stepladder: Make sure that it is fully opened. Do not climb a closed stepladder. Make sure that both sides of the stepladder are locked into place. Ask someone to hold it for you, if possible. Clearly mark and make sure that you can  see: Any grab bars or handrails. First and last steps. Where the edge of each step is. Use tools that help you move around (mobility aids) if they are needed. These include: Canes. Walkers. Scooters. Crutches. Turn on the lights when you go into a dark area. Replace any light bulbs as soon as they burn out. Set up your furniture so you have a clear path. Avoid moving your furniture around. If any of your floors are uneven, fix them. If there are any pets around you, be aware of where they are. Review your medicines with your doctor. Some medicines can make you feel dizzy. This can increase your chance of falling. Ask your doctor what other things that you can do to help prevent falls. This information is not intended to replace advice given to you by your health care provider. Make sure you discuss any questions you have with your health care provider. Document Released: 08/12/2009 Document Revised: 03/23/2016 Document Reviewed: 11/20/2014 Elsevier Interactive Patient Education  2017 [employer].

## 2022-10-25 ENCOUNTER — Ambulatory Visit: Payer: Medicare PPO | Admitting: *Deleted

## 2022-10-25 ENCOUNTER — Ambulatory Visit: Payer: Medicare PPO

## 2022-10-25 DIAGNOSIS — I050 Rheumatic mitral stenosis: Secondary | ICD-10-CM | POA: Diagnosis not present

## 2022-10-25 LAB — POCT INR
INR: 3.5 — AB (ref 2.0–3.0)
PT: 42.3

## 2022-10-25 NOTE — Patient Instructions (Signed)
Continue 4mg  daily except 5mg  on Tuesday and Saturday. F/u in 3 weeks.

## 2022-11-15 ENCOUNTER — Ambulatory Visit (INDEPENDENT_AMBULATORY_CARE_PROVIDER_SITE_OTHER): Payer: Medicare PPO

## 2022-11-15 ENCOUNTER — Ambulatory Visit: Payer: Medicare PPO | Admitting: Orthopaedic Surgery

## 2022-11-15 ENCOUNTER — Ambulatory Visit: Payer: Medicare PPO

## 2022-11-15 DIAGNOSIS — G8929 Other chronic pain: Secondary | ICD-10-CM

## 2022-11-15 DIAGNOSIS — I050 Rheumatic mitral stenosis: Secondary | ICD-10-CM | POA: Diagnosis not present

## 2022-11-15 DIAGNOSIS — M1712 Unilateral primary osteoarthritis, left knee: Secondary | ICD-10-CM

## 2022-11-15 DIAGNOSIS — M25562 Pain in left knee: Secondary | ICD-10-CM

## 2022-11-15 LAB — POCT INR
INR: 3 (ref 2.0–3.0)
POC INR: 3
PT: 36.2

## 2022-11-15 MED ORDER — LIDOCAINE HCL 1 % IJ SOLN
3.0000 mL | INTRAMUSCULAR | Status: AC | PRN
  Administered 2022-11-15: 3 mL

## 2022-11-15 MED ORDER — METHYLPREDNISOLONE ACETATE 40 MG/ML IJ SUSP
40.0000 mg | INTRAMUSCULAR | Status: AC | PRN
  Administered 2022-11-15: 40 mg via INTRA_ARTICULAR

## 2022-11-15 NOTE — Patient Instructions (Signed)
Description   Continue 4mg daily except 5mg on Tuesday and Saturday. F/u in 3 weeks.      

## 2022-11-15 NOTE — Progress Notes (Signed)
The patient is a 67 year old female well-known to me.  I have seen her and family reason for.  She has been dealing with left knee pain for many years now.  She has remote history of a left knee arthroscopy with partial lateral meniscectomy done elsewhere.  She is really having a hard time walking around over the last month and is getting worse for her.  She is dealing with chronic A-fib.  She also has had a heart valve replacement.  She is on chronic Coumadin.  If she does need type of procedure or surgery she has to be bridged with Lovenox.  She has peripheral edema that is developed as well.  Examination of her left knee does show pain throughout the arc of motion of the knee with patellofemoral crepitation and lateral joint line tenderness.  There is no instability of the knee.  There is a mild effusion.  X-rays of the left knee show severe end-stage tricompartment arthritis mainly involving the lateral compartment and patellofemoral joint where there is bone-on-bone wear.  There are osteophytes in all 3 compartments.  The only definitive treatment for her would be a knee replacement.  She is going to talk to her cardiologist about that possibility.  If that is something that we decide to pursue together, we will see her back in the office to consider this.  I would definitely have to bridge her with Lovenox as well.  I did offer her a steroid injection today to see if this would help with the acute pain in her left knee.  Of note she has had hyaluronic acid in the past that has not helped.  She did tolerate the steroid injection today.  They will contact us with neck steps once they have seen the cardiologist.      Procedure Note  Patient: Victoria Davila             Date of Birth: Sep 13, 1956           MRN: [id number]             Visit Date: 11/15/2022  Procedures: Visit Diagnoses:  1. Chronic pain of left knee   2. Unilateral primary osteoarthritis, left knee     Large Joint Inj: L  knee on 11/15/2022 2:40 PM Indications: diagnostic evaluation and pain Details: 22 G 1.5 in needle, superolateral approach  Arthrogram: No  Medications: 3 mL lidocaine 1 %; 40 mg methylPREDNISolone acetate 40 MG/ML Outcome: tolerated well, no immediate complications Procedure, treatment alternatives, risks and benefits explained, specific risks discussed. Consent was given by the patient. Immediately prior to procedure a time out was called to verify the correct patient, procedure, equipment, support staff and site/side marked as required. Patient was prepped and draped in the usual sterile fashion.

## 2022-11-20 ENCOUNTER — Encounter: Payer: Self-pay | Admitting: Family Medicine

## 2022-11-20 ENCOUNTER — Other Ambulatory Visit: Payer: Self-pay | Admitting: Family Medicine

## 2022-11-20 DIAGNOSIS — Z1231 Encounter for screening mammogram for malignant neoplasm of breast: Secondary | ICD-10-CM

## 2022-12-06 ENCOUNTER — Ambulatory Visit: Payer: Medicare PPO

## 2022-12-06 DIAGNOSIS — I050 Rheumatic mitral stenosis: Secondary | ICD-10-CM

## 2022-12-06 LAB — POCT INR
INR: 3.5 — AB (ref 2.0–3.0)
POC INR: 3.5
PT: 41.9

## 2022-12-06 NOTE — Patient Instructions (Signed)
Description   Continue 4mg daily except 5mg on Tuesday and Saturday. F/u in 3 weeks.      

## 2022-12-25 ENCOUNTER — Ambulatory Visit
Admission: RE | Admit: 2022-12-25 | Discharge: 2022-12-25 | Disposition: A | Discharge status: Home / Self Care | Payer: Medicare PPO | Source: Ambulatory Visit | Attending: Family Medicine | Admitting: Family Medicine

## 2022-12-25 DIAGNOSIS — Z1231 Encounter for screening mammogram for malignant neoplasm of breast: Secondary | ICD-10-CM | POA: Insufficient documentation

## 2022-12-27 ENCOUNTER — Ambulatory Visit: Payer: Medicare PPO

## 2022-12-27 ENCOUNTER — Other Ambulatory Visit: Payer: Self-pay | Admitting: Family Medicine

## 2022-12-27 ENCOUNTER — Other Ambulatory Visit: Payer: Self-pay

## 2022-12-27 ENCOUNTER — Telehealth: Payer: Self-pay

## 2022-12-27 DIAGNOSIS — B002 Herpesviral gingivostomatitis and pharyngotonsillitis: Secondary | ICD-10-CM

## 2022-12-27 DIAGNOSIS — I050 Rheumatic mitral stenosis: Secondary | ICD-10-CM

## 2022-12-27 LAB — POCT INR
INR: 2.6 (ref 2.0–3.0)
POC INR: 31

## 2022-12-27 MED ORDER — VALACYCLOVIR HCL 1 G PO TABS: 1000.0000 mg | ORAL_TABLET | Freq: Two times a day (BID) | ORAL | 5 refills | Status: DC

## 2022-12-27 NOTE — Patient Instructions (Signed)
Description   Continue '4mg'$  daily except '5mg'$  on Tuesday and Saturday. F/u in 3 weeks.

## 2022-12-27 NOTE — Progress Notes (Signed)
Valtrex refill printed due to pharmacy tech issues, unable to send electronically  Will fax printed version   Eulis Foster, MD  [facility]

## 2022-12-27 NOTE — Telephone Encounter (Signed)
Pt given mammogram results per notes of PCP on 12/27/22. Pt verbalized understanding.

## 2022-12-29 NOTE — Telephone Encounter (Signed)
[facility] faxed refill request for the following medications:   valACYclovir (VALTREX) 1000 MG tablet   Looks like this may have not went through when faxed on 12/27/2022  Please advise.

## 2023-01-04 ENCOUNTER — Other Ambulatory Visit: Payer: Self-pay | Admitting: Family Medicine

## 2023-01-04 DIAGNOSIS — B002 Herpesviral gingivostomatitis and pharyngotonsillitis: Secondary | ICD-10-CM

## 2023-01-04 MED ORDER — VALACYCLOVIR HCL 1 G PO TABS: 1000.0000 mg | ORAL_TABLET | Freq: Two times a day (BID) | ORAL | 5 refills | Status: AC

## 2023-01-08 NOTE — Addendum Note (Signed)
Addended by: Barnie Mort on: 01/08/2023 04:24 PM   Modules accepted: Orders

## 2023-01-17 ENCOUNTER — Ambulatory Visit: Payer: Medicare PPO

## 2023-01-17 DIAGNOSIS — I052 Rheumatic mitral stenosis with insufficiency: Secondary | ICD-10-CM

## 2023-01-17 LAB — POCT INR: INR: 3.1 — AB (ref 2.0–3.0)

## 2023-01-17 NOTE — Patient Instructions (Signed)
Description   Continue 4mg daily except 5mg on Tuesday and Saturday. F/u in 3 weeks.      

## 2023-01-17 NOTE — Progress Notes (Signed)
Patient POCT INR completed. Today INR was 3.1. She was advised to make no changes and scheduled her next visit for 3 weeks.

## 2023-02-07 ENCOUNTER — Ambulatory Visit: Payer: Medicare PPO

## 2023-02-07 DIAGNOSIS — I050 Rheumatic mitral stenosis: Secondary | ICD-10-CM

## 2023-02-07 LAB — POCT INR
INR: 2.8 (ref 2.0–3.0)
POC INR: 2.8
PT: 33.6

## 2023-03-07 ENCOUNTER — Ambulatory Visit (INDEPENDENT_AMBULATORY_CARE_PROVIDER_SITE_OTHER): Payer: Medicare PPO

## 2023-03-07 ENCOUNTER — Other Ambulatory Visit: Payer: Self-pay | Admitting: Family Medicine

## 2023-03-07 DIAGNOSIS — I480 Paroxysmal atrial fibrillation: Secondary | ICD-10-CM

## 2023-03-07 DIAGNOSIS — I050 Rheumatic mitral stenosis: Secondary | ICD-10-CM

## 2023-03-07 LAB — POCT INR
INR: 2.9 (ref 2.0–3.0)
POC INR: 2.9
PT: 34.8

## 2023-03-07 NOTE — Telephone Encounter (Signed)
Requested medication (s) are due for refill today: Due 03/20/23  Requested medication (s) are on the active medication list: yes    Last refill: 09/19/22  #90  1 refill  Future visit scheduled Yes 03/15/23  Notes to clinic:Failed  due to labs. Pt has appt 03/15/23. Please review. Thank you  Requested Prescriptions  Pending Prescriptions Disp Refills   warfarin (COUMADIN) 4 MG tablet [Pharmacy Med Name: WARFARIN SODIUM 4 MG TAB] 90 tablet 1    Sig: TAKE ONE TABLET BY MOUTH EVERY DAY ALONGWITH A 3MG  TABLET     Hematology:  Anticoagulants - warfarin Failed - 03/07/2023  4:19 PM      Failed - Manual Review: If patient's warfarin is managed by Anti-Coag team, route request to them. If not, route request to the provider.      Failed - HCT in normal range and within 360 days    Hematocrit  Date Value Ref Range Status  01/05/2022 41.0 34.0 - 46.6 % Final         Failed - Valid encounter within last 3 months    Recent Outpatient Visits           5 months ago Cerebral seizure (HCC)   [place] [facility] Simmons-Robinson, Alger, MD   8 months ago Cerebral seizure [facility]   [place] [facility] Bosie Clos, MD   1 year ago Hypercholesteremia   [place] [facility] Bosie Clos, MD   1 year ago Viral upper respiratory tract infection   [place] [facility] Mecum, Oswaldo Conroy, PA-C   1 year ago Medicare annual wellness visit, subsequent   [place] [facility] Bosie Clos, MD       Future Appointments             In 1 week Simmons-Robinson, Tawanna Cooler, MD [facility]            Passed - INR in normal range and within 30 days    POC INR  Date Value Ref Range Status  03/07/2023 2.9  Final   INR  Date Value Ref Range Status  03/07/2023 2.9 2.0 - 3.0 Final         Passed - Patient is not pregnant

## 2023-03-10 IMAGING — MG DIGITAL DIAGNOSTIC BILAT W/ TOMO W/ CAD
8 of 12 series · 8 of 28 positions shown · non-contrast
Comparison: Previous exam(s).

CLINICAL DATA: Right breast asymmetry and calcifications; left
breast focal asymmetry seen on most recent screening mammography.
Etry and calcifications



[R CC]
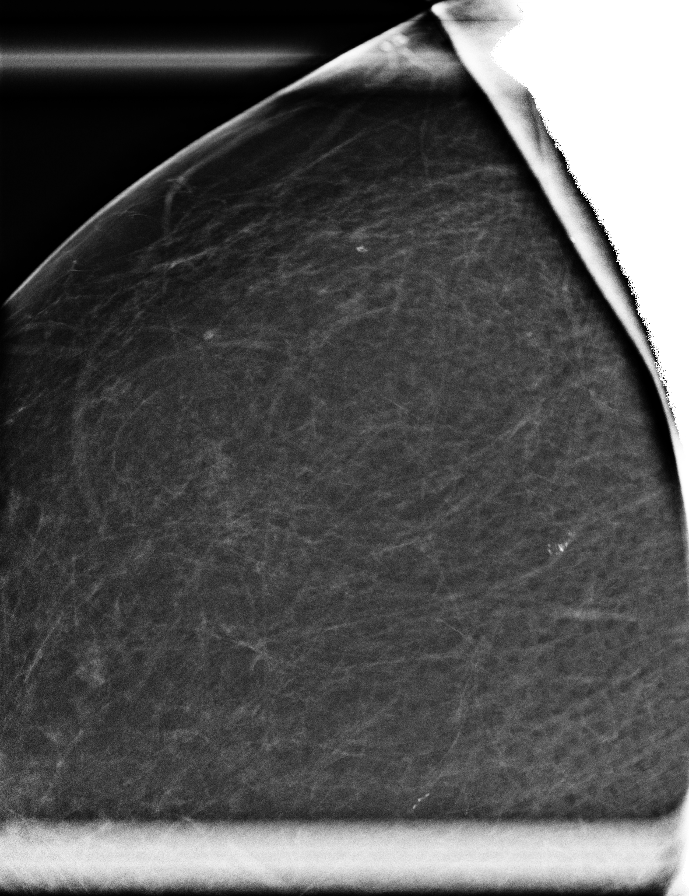

[R ML (1 of 3)]
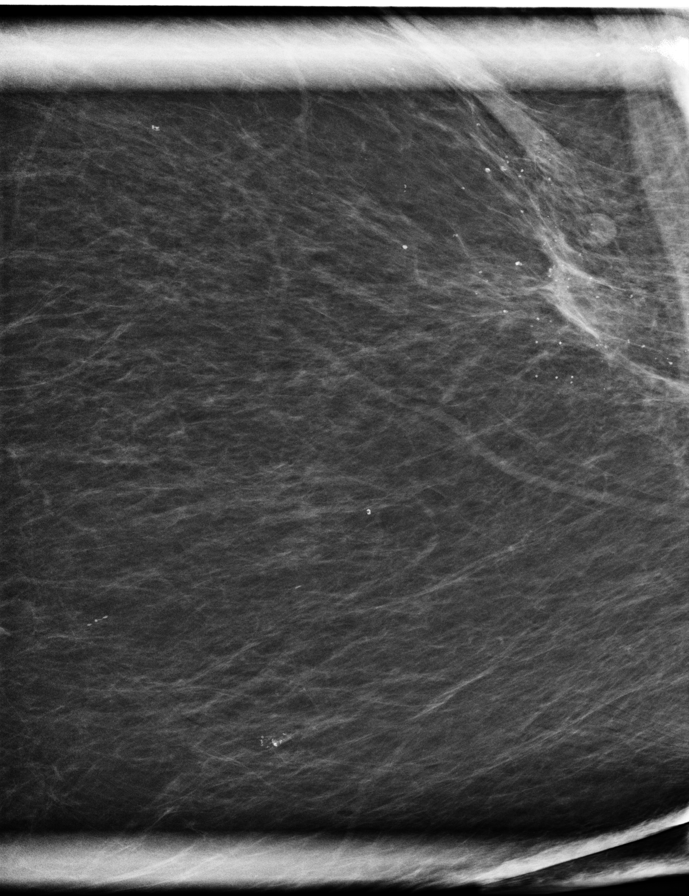

[R ML (2 of 3)]
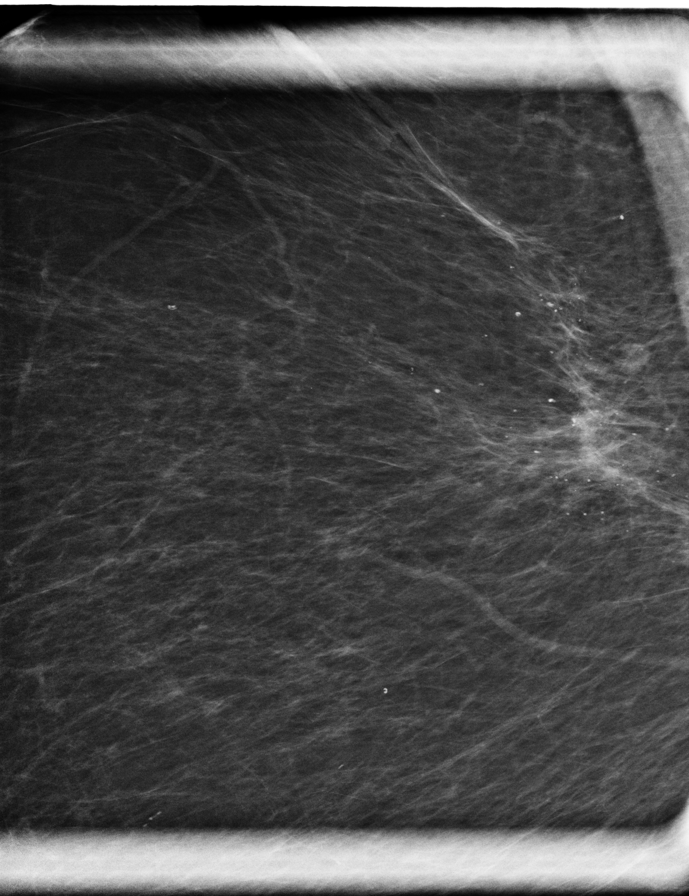

[R ML (3 of 3)]
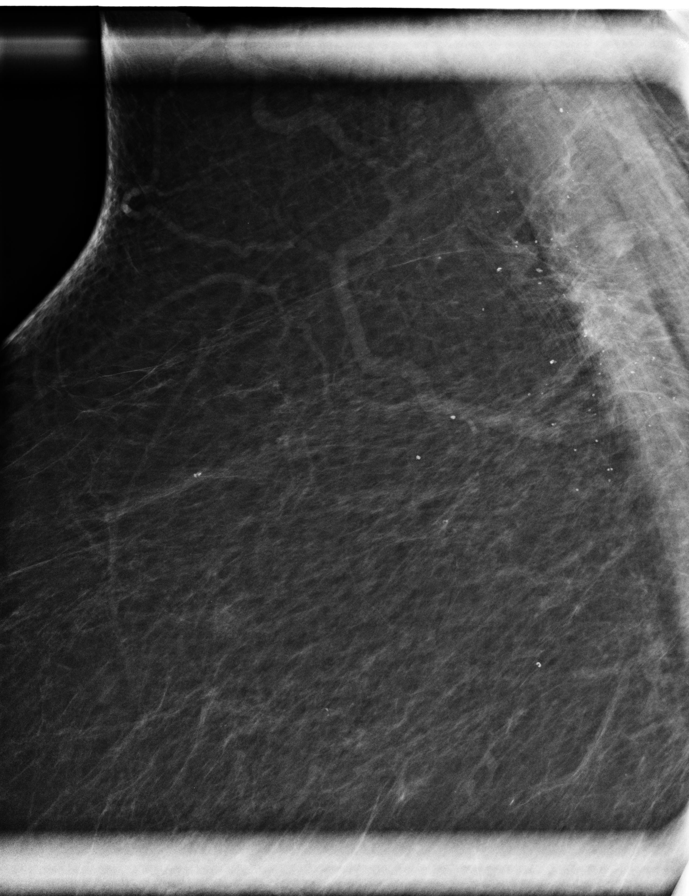

[L MLO synth-2D (1 of 2)]
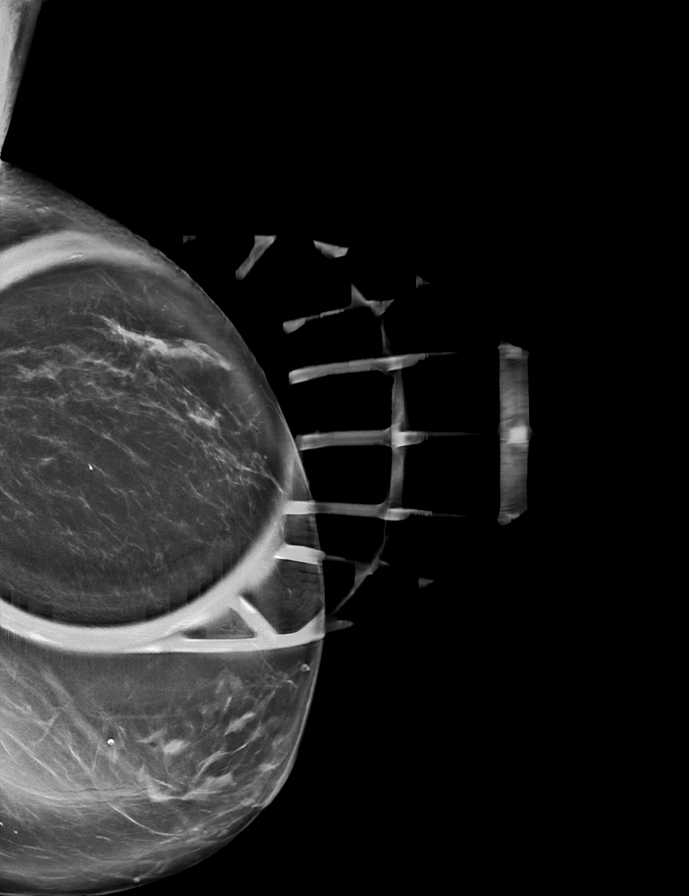

[L CC synth-2D]
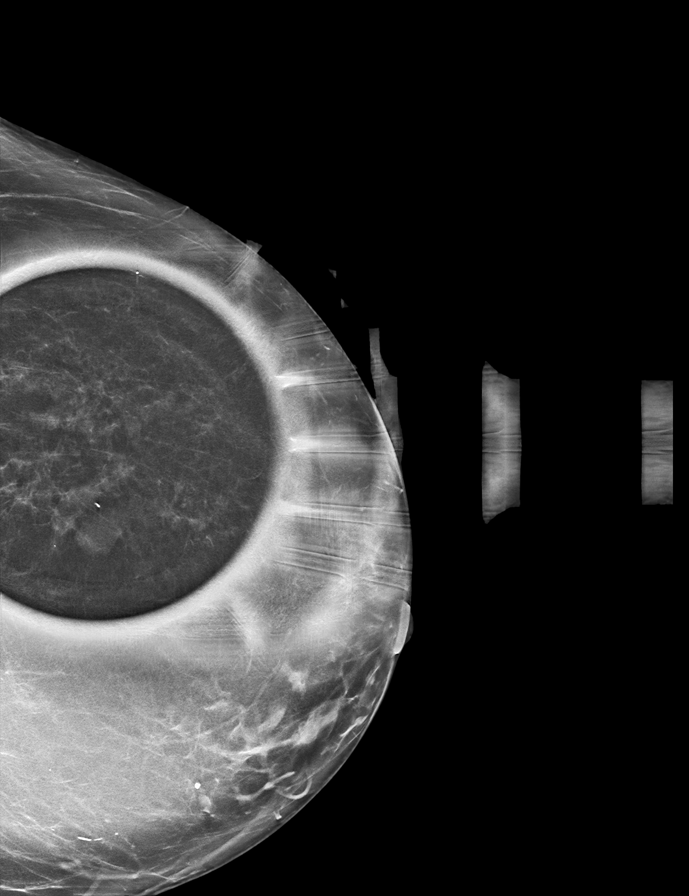

[L MLO synth-2D (2 of 2)]
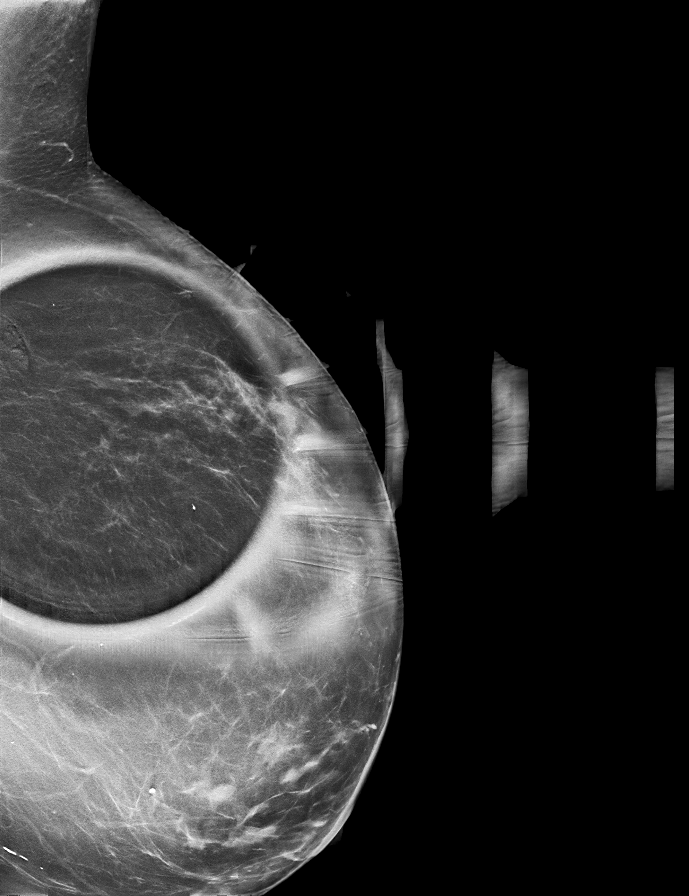

[R ML synth-2D]
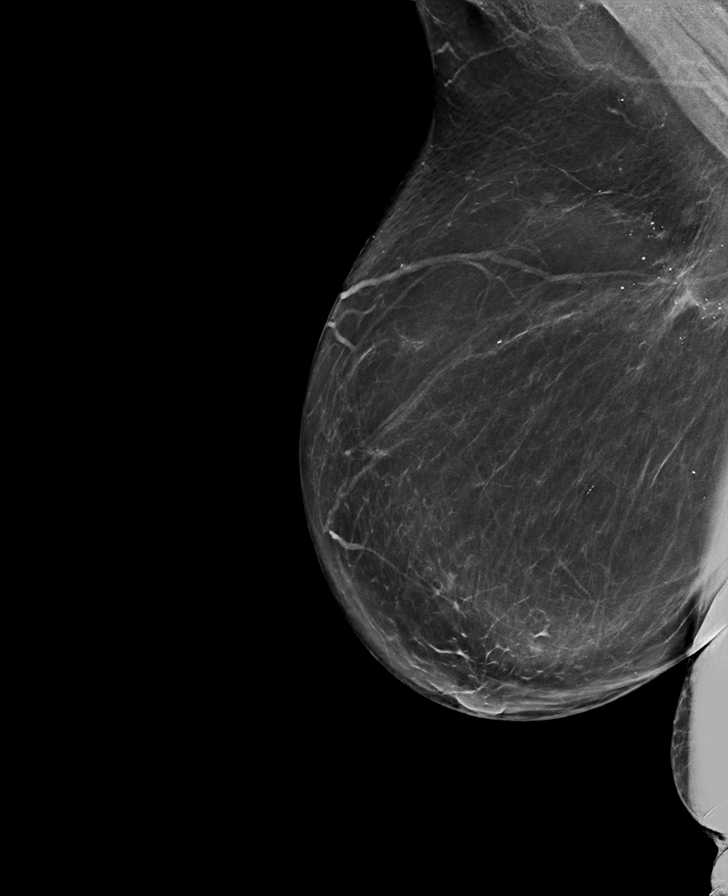

[8 of 28 positions shown; findings below may reference images not displayed]

ACR Breast Density Category b: There are scattered areas of
fibroglandular density.
FINDINGS: Mammographically, there is an asymmetry with associated coarse round
calcifications in the right breast upper outer quadrant, far
posterior depth, seen best on the MLO view, due to its far posterior
location. This corresponds to an area of scarring seen on physical
exam from prior cardiac surgery.

Mammographically, in the left breast there are no suspicious masses
or areas of architectural distortion. The previously demonstrated
focal asymmetry effaces to glandular tissue.

On physical exam, there is a large diagonal scar in the right breast
upper outer quadrant, far posterior depth.

Targeted right breast ultrasound demonstrates an area of scarring in
the upper outer quadrant, 13 cm from the nipple. No suspicious
masses are seen.

Targeted left breast ultrasound is performed demonstrating no
suspicious masses or shadowing lesions. In the left breast 12:30
o'clock 8 cm from nipple there is a prominent breast lobule
measuring 1.2 cm which may correspond to the focal asymmetry seen
mammographically.
IMPRESSION: No mammographic or sonographic evidence of breast malignancy.

The focal asymmetry with calcifications seen in patient's right
breast on most recent screening mammogram corresponds to
postsurgical scarring.

RECOMMENDATION:
Screening mammogram in one year.(Code:[id number])

I have discussed the findings and recommendations with the patient.
If applicable, a reminder letter will be sent to the patient
regarding the next appointment.

BI-RADS CATEGORY  2: Benign.

## 2023-03-14 NOTE — Progress Notes (Unsigned)
I,Joseline E Rosas,acting as a scribe for [employer], MD.,have documented all relevant documentation on the behalf of Ronnald Ramp, MD,as directed by  Ronnald Ramp, MD while in the presence of Ronnald Ramp, MD.   Established patient visit   Patient: Victoria Davila   DOB: Jun 30, 1956   67 y.o. Female  MRN: [id number] Visit Date: 03/15/2023  Today's healthcare provider: Ronnald Ramp, MD   Chief Complaint  Patient presents with   Follow-up   Subjective    HPI   Patient's husband is present for encounter   Seizure Disorder  Patient presents for chronic follow up for dilantin levels in setting of seizure disorder. She has had no seizure in the last 30 years. Treatment thus far has been: phenytoin (Dilantin). Patient was recommended for neurology evaluation In Nov of 2023, declined referral.  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    Hypertension, follow-up  BP Readings from Last 3 Encounters:  03/15/23 129/83  09/13/22 127/82  06/13/22 130/76   Wt Readings from Last 3 Encounters:  03/15/23 232 lb 3.2 oz (105.3 kg)  10/16/22 238 lb (108 kg)  09/13/22 238 lb 11.2 oz (108.3 kg)     She was last seen for hypertension 6 months ago.  BP at that visit was 127/82. Management since that visit includes continue metoprolol 100mg  daily and follow up with [facility] cardiology as scheduled   She reports excellent compliance with treatment.  She does not smoke.   Outside blood pressures are not being checked. Symptoms: No chest pain No chest pressure  No palpitations No syncope  No dyspnea No orthopnea  No paroxysmal nocturnal dyspnea Yes lower extremity edema   Pertinent labs Lab Results  Component Value Date    CHOL 209 (H) 01/05/2022   HDL 87 01/05/2022   LDLCALC 104 (H) 01/05/2022   TRIG 102 01/05/2022   CHOLHDL 2.4 01/05/2022   Lab Results  Component Value Date   NA 147 (H) 01/05/2022   K 4.5 01/05/2022   CREATININE 1.02 (H) 01/05/2022   EGFR 61 01/05/2022   GLUCOSE 90 01/05/2022   TSH 3.440 09/01/2021     The 10-year ASCVD risk score (Arnett DK, et al., 2019) is: 8.2%  ---------------------------------------------------------------------------------------------------  Health Maintenance Colonoscopy: Had Cologuard done 11/28/2021  TDAP: recommended   PNA Vaccine:will get the pneumonia vaccine  Shingles WUJ:[id number]    Medications: Outpatient Medications Prior to Visit  Medication Sig   acetaminophen (TYLENOL) 325 MG tablet Take 1,000 mg by mouth every 6 (six) hours as needed for mild pain or moderate pain.   metoprolol succinate (TOPROL-XL) 100 MG 24 hr tablet Take 100 mg by mouth daily.   Multiple Vitamin (MULTIVITAMIN WITH MINERALS) TABS tablet Take 1 tablet by mouth at bedtime.   Omega-3 Fatty Acids (FISH OIL) 1200 MG CAPS Take 1 capsule by mouth 2 (two) times daily.    potassium chloride (K-DUR) 10 MEQ tablet Take 3 tablets (30 mEq total) by mouth 2 (two) times daily.   psyllium (REGULOID) 0.52 G capsule Take 0.52 g by mouth 2 (two) times daily.   rosuvastatin (CRESTOR) 20 MG tablet Take 1 tablet (20 mg total) by mouth daily.   torsemide (DEMADEX) 20 MG tablet Take 2 tablets (40 mg total) by mouth 2 (  two) times daily.   valACYclovir (VALTREX) 1000 MG tablet Take 1 tablet (1,000 mg total) by mouth 2 (two) times daily. For  acute flares   warfarin (COUMADIN) 4 MG tablet TAKE ONE TABLET BY MOUTH EVERY DAY ALONGWITH A 3MG  TABLET   warfarin (COUMADIN) 5 MG tablet Take 1 tablet (5 mg total) by mouth daily.   [DISCONTINUED] DILANTIN 100 MG ER capsule TAKE FOUR CAPSULES BY MOUTH EVERY DAY   No facility-administered medications prior to visit.    Review of Systems      Objective    BP 129/83 (BP Location: Left Arm, Patient Position: Sitting, Cuff Size: Large)   Pulse 65   Temp 98.6 F (37 C) (Oral)   Resp 16   Ht 5\' 5"  (1.651 m)   Wt 232 lb 3.2 oz (105.3 kg)   BMI 38.64 kg/m    Physical Exam Vitals reviewed.  Constitutional:      General: She is not in acute distress.    Appearance: Normal appearance. She is not ill-appearing, toxic-appearing or diaphoretic.  Eyes:     Conjunctiva/sclera: Conjunctivae normal.  Cardiovascular:     Rate and Rhythm: Normal rate and regular rhythm.     Pulses: Normal pulses.     Heart sounds: Murmur heard.     No friction rub. No gallop.  Pulmonary:     Effort: Pulmonary effort is normal. No respiratory distress.     Breath sounds: Normal breath sounds. No stridor. No wheezing, rhonchi or rales.  Abdominal:     General: Bowel sounds are normal. There is no distension.     Palpations: Abdomen is soft.     Tenderness: There is no abdominal tenderness.  Musculoskeletal:     Right lower leg: No edema.     Left lower leg: No edema.  Skin:    Findings: No erythema or rash.  Neurological:     Mental Status: She is alert and oriented to person, place, and time.     Cranial Nerves: Cranial nerves 2-12 are intact. No cranial nerve deficit or dysarthria.     Motor: No tremor.     Comments: Ambulates with cane         No results found for any visits on 03/15/23.  Assessment & Plan     Problem List Items Addressed This Visit       Cardiovascular and Mediastinum   Essential (primary) hypertension    Controlled BP at goal Continue current medications at current doses No medications changes today  CMP collected today        Relevant Orders   Comprehensive metabolic panel   PAF (paroxysmal atrial fibrillation) (HCC)    Chronic  Stable  Rate controlled appears to be in sinus rhythm based on exam today  Continue warfarin on current schedule  F/u as scheduled in INR clinic         Nervous and  Auditory   Cerebral seizure (HCC) - Primary    Chronic  Stable  Remains seizure free  Patient and spouse do not prefer to establish with neurology at this time Recommended specialist evaluation for considering different regimen for seizure control, they voiced understanding and would like to continue measuring dilantin levels every 6 months for now  Dilantin level collected  Continue dilantin 100mg  daily       Relevant Medications   DILANTIN 100 MG ER capsule   Other Relevant Orders   Dilantin (Phenytoin) level, total     Other  Hypercholesteremia    Chronic  Last LDL 104 Continue crestor 20mg  daily Lipid panel collected today   Lab Results  Component Value Date   LDLCALC 104 (H) 01/05/2022        Relevant Orders   Lipid panel     Return in about 6 months (around 09/15/2023) for dilantin levels.       The entirety of the information documented in the History of Present Illness, Review of Systems and Physical Exam were personally obtained by me. Portions of this information were initially documented by Hetty Ely, CMA. I, Ronnald Ramp, MD have reviewed the documentation above for thoroughness and accuracy.      Ronnald Ramp, MD  [facility] [phone] (phone) [phone] (fax)  [facility]

## 2023-03-15 ENCOUNTER — Ambulatory Visit: Payer: Medicare PPO | Admitting: Family Medicine

## 2023-03-15 ENCOUNTER — Encounter: Payer: Self-pay | Admitting: Family Medicine

## 2023-03-15 VITALS — BP 129/83 | HR 65 | Temp 98.6°F | Resp 16 | Ht 65.0 in | Wt 232.2 lb

## 2023-03-15 DIAGNOSIS — Z0000 Encounter for general adult medical examination without abnormal findings: Secondary | ICD-10-CM | POA: Insufficient documentation

## 2023-03-15 DIAGNOSIS — I480 Paroxysmal atrial fibrillation: Secondary | ICD-10-CM

## 2023-03-15 DIAGNOSIS — G40909 Epilepsy, unspecified, not intractable, without status epilepticus: Secondary | ICD-10-CM | POA: Diagnosis not present

## 2023-03-15 DIAGNOSIS — E7800 Pure hypercholesterolemia, unspecified: Secondary | ICD-10-CM

## 2023-03-15 DIAGNOSIS — I10 Essential (primary) hypertension: Secondary | ICD-10-CM | POA: Diagnosis not present

## 2023-03-15 MED ORDER — DILANTIN 100 MG PO CAPS: ORAL_CAPSULE | ORAL | 5 refills | Status: DC

## 2023-03-15 NOTE — Assessment & Plan Note (Signed)
Chronic  Last LDL 104 Continue crestor 20mg  daily Lipid panel collected today   Lab Results  Component Value Date   LDLCALC 104 (H) 01/05/2022

## 2023-03-15 NOTE — Assessment & Plan Note (Signed)
Chronic  Stable  Rate controlled appears to be in sinus rhythm based on exam today  Continue warfarin on current schedule  F/u as scheduled in INR clinic

## 2023-03-15 NOTE — Assessment & Plan Note (Addendum)
Controlled BP at goal Continue current medications at current doses No medications changes today  CMP collected today  

## 2023-03-15 NOTE — Assessment & Plan Note (Signed)
Chronic  Stable  Remains seizure free  Patient and spouse do not prefer to establish with neurology at this time Recommended specialist evaluation for considering different regimen for seizure control, they voiced understanding and would like to continue measuring dilantin levels every 6 months for now  Dilantin level collected  Continue dilantin 100mg  daily

## 2023-03-16 LAB — LIPID PANEL
Chol/HDL Ratio: 2.7 ratio (ref 0.0–4.4)
Cholesterol, Total: 202 mg/dL — ABNORMAL HIGH (ref 100–199)
HDL: 76 mg/dL (ref >39)
LDL Chol Calc (NIH): 104 mg/dL — ABNORMAL HIGH (ref 0–99)
Triglycerides: 125 mg/dL (ref 0–149)
VLDL Cholesterol Cal: 22 mg/dL (ref 5–40)

## 2023-03-16 LAB — COMPREHENSIVE METABOLIC PANEL
ALT: 23 IU/L (ref 0–32)
AST: 27 IU/L (ref 0–40)
Albumin/Globulin Ratio: 1.5 (ref 1.2–2.2)
Albumin: 4.3 g/dL (ref 3.9–4.9)
Alkaline Phosphatase: 153 IU/L — ABNORMAL HIGH (ref 44–121)
BUN/Creatinine Ratio: 17 (ref 12–28)
BUN: 17 mg/dL (ref 8–27)
Bilirubin Total: 0.4 mg/dL (ref 0.0–1.2)
CO2: 23 mmol/L (ref 20–29)
Calcium: 9.4 mg/dL (ref 8.7–10.3)
Chloride: 102 mmol/L (ref 96–106)
Creatinine, Ser: 1 mg/dL (ref 0.57–1.00)
Globulin, Total: 2.9 g/dL (ref 1.5–4.5)
Glucose: 90 mg/dL (ref 70–99)
Potassium: 4.5 mmol/L (ref 3.5–5.2)
Sodium: 143 mmol/L (ref 134–144)
Total Protein: 7.2 g/dL (ref 6.0–8.5)
eGFR: 62 mL/min/{1.73_m2} (ref >59)

## 2023-03-16 LAB — PHENYTOIN LEVEL, TOTAL: Phenytoin (Dilantin), Serum: 16.3 ug/mL (ref 10.0–20.0)

## 2023-04-04 ENCOUNTER — Ambulatory Visit: Payer: Medicare PPO

## 2023-04-04 DIAGNOSIS — I050 Rheumatic mitral stenosis: Secondary | ICD-10-CM | POA: Diagnosis not present

## 2023-04-04 LAB — POCT INR
INR: 2.5 (ref 2.0–3.0)
POC INR: 2.5
PT: 30.4

## 2023-04-04 NOTE — Patient Instructions (Signed)
Description   Continue 4mg  daily except 5mg  on Tuesday and Saturday. F/u in 4 weeks.

## 2023-05-02 ENCOUNTER — Ambulatory Visit: Payer: Medicare PPO

## 2023-05-02 DIAGNOSIS — I052 Rheumatic mitral stenosis with insufficiency: Secondary | ICD-10-CM

## 2023-05-02 DIAGNOSIS — I050 Rheumatic mitral stenosis: Secondary | ICD-10-CM

## 2023-05-02 LAB — POCT INR
INR: 3.4 — AB (ref 2.0–3.0)
POC INR: 3.1
PT: 40.9

## 2023-05-02 NOTE — Patient Instructions (Signed)
Description   Continue 4mg daily except 5mg on Tuesday and Saturday. F/u in 4 weeks.     

## 2023-05-23 ENCOUNTER — Ambulatory Visit: Payer: Medicare PPO

## 2023-05-23 ENCOUNTER — Ambulatory Visit: Payer: Medicare PPO | Admitting: Orthopaedic Surgery

## 2023-05-23 ENCOUNTER — Encounter: Payer: Self-pay | Admitting: Orthopaedic Surgery

## 2023-05-23 DIAGNOSIS — M25562 Pain in left knee: Secondary | ICD-10-CM | POA: Diagnosis not present

## 2023-05-23 DIAGNOSIS — G8929 Other chronic pain: Secondary | ICD-10-CM | POA: Diagnosis not present

## 2023-05-23 DIAGNOSIS — M1712 Unilateral primary osteoarthritis, left knee: Secondary | ICD-10-CM

## 2023-05-23 DIAGNOSIS — I050 Rheumatic mitral stenosis: Secondary | ICD-10-CM | POA: Diagnosis not present

## 2023-05-23 LAB — POCT INR
INR: 3.4 — AB (ref 2.0–3.0)
POC INR: 3.4
PT: 40.2

## 2023-05-23 MED ORDER — LIDOCAINE HCL 1 % IJ SOLN
3.0000 mL | INTRAMUSCULAR | Status: AC | PRN
Start: 2023-05-23 — End: 2023-05-23
  Administered 2023-05-23: 3 mL

## 2023-05-23 MED ORDER — METHYLPREDNISOLONE ACETATE 40 MG/ML IJ SUSP
40.0000 mg | INTRAMUSCULAR | Status: AC | PRN
Start: 2023-05-23 — End: 2023-05-23
  Administered 2023-05-23: 40 mg via INTRA_ARTICULAR

## 2023-05-23 NOTE — Patient Instructions (Signed)
Description   Continue 4mg daily except 5mg on Tuesday and Saturday. F/u in 3 weeks.      

## 2023-05-23 NOTE — Progress Notes (Signed)
The patient is well-known to me.  She comes in today requesting a steroid injection in her left knee due to chronic left knee pain and known osteoarthritis of the left knee.  She does have significant comorbidities and is on Coumadin.  We last injected her left knee with a steroid back in January.  She is requesting another steroid injection today.  She has had no acute change in her medical status.  She does have chronic heart issues.  Examination of her left knee shows slight varus malalignment with pain throughout the arc of motion.  I did place a steroid injection in her left knee without difficulty.  She knows at a minimum to wait 3 months between injections.  All question concerns were answered and addressed.    Procedure Note  Patient: Victoria Davila             Date of Birth: 1956-06-02           MRN: [id number]             Visit Date: 05/23/2023  Procedures: Visit Diagnoses:  1. Chronic pain of left knee   2. Unilateral primary osteoarthritis, left knee     Large Joint Inj: L knee on 05/23/2023 4:48 PM Indications: diagnostic evaluation and pain Details: 22 G 1.5 in needle, superolateral approach  Arthrogram: No  Medications: 3 mL lidocaine 1 %; 40 mg methylPREDNISolone acetate 40 MG/ML Outcome: tolerated well, no immediate complications Procedure, treatment alternatives, risks and benefits explained, specific risks discussed. Consent was given by the patient. Immediately prior to procedure a time out was called to verify the correct patient, procedure, equipment, support staff and site/side marked as required. Patient was prepped and draped in the usual sterile fashion.

## 2023-06-13 ENCOUNTER — Ambulatory Visit: Payer: Medicare PPO

## 2023-06-13 DIAGNOSIS — I050 Rheumatic mitral stenosis: Secondary | ICD-10-CM

## 2023-06-13 LAB — POCT INR
INR: 3.8 — AB (ref 2.0–3.0)
PT: 45.5

## 2023-06-13 NOTE — Patient Instructions (Signed)
Description   Change to 4 mg daily. Return in 3 weeks.

## 2023-07-04 ENCOUNTER — Ambulatory Visit: Payer: Medicare PPO

## 2023-07-04 ENCOUNTER — Other Ambulatory Visit: Payer: Self-pay | Admitting: Family Medicine

## 2023-07-04 DIAGNOSIS — I050 Rheumatic mitral stenosis: Secondary | ICD-10-CM | POA: Diagnosis not present

## 2023-07-04 DIAGNOSIS — I480 Paroxysmal atrial fibrillation: Secondary | ICD-10-CM

## 2023-07-04 LAB — POCT INR
INR: 3.6 — AB (ref 2.0–3.0)
PT: 43.7

## 2023-07-04 MED ORDER — WARFARIN SODIUM 3 MG PO TABS
ORAL_TABLET | ORAL | 0 refills | Status: DC
Start: 2023-07-07 — End: 2023-10-03

## 2023-07-04 MED ORDER — WARFARIN SODIUM 4 MG PO TABS: ORAL_TABLET | ORAL | 1 refills | Status: DC

## 2023-07-04 NOTE — Progress Notes (Signed)
INR 3.6  Changed warfarin to 4mg  M-F and Sa,Sun 3mg 

## 2023-07-04 NOTE — Patient Instructions (Signed)
Description   Change to 4 mg daily except 3 mg Saturday and Sunday. Return in 2-3 weeks.

## 2023-07-18 ENCOUNTER — Ambulatory Visit: Payer: Medicare PPO

## 2023-07-18 DIAGNOSIS — I050 Rheumatic mitral stenosis: Secondary | ICD-10-CM | POA: Diagnosis not present

## 2023-07-18 LAB — POCT INR
INR: 2.5 (ref 2.0–3.0)
PT: 30.5

## 2023-07-18 NOTE — Patient Instructions (Signed)
Description   Change to 4 mg daily except 3 mg Saturday and Sunday. Return in 2 weeks.

## 2023-08-01 ENCOUNTER — Ambulatory Visit: Payer: Medicare PPO

## 2023-08-01 DIAGNOSIS — I050 Rheumatic mitral stenosis: Secondary | ICD-10-CM

## 2023-08-01 LAB — POCT INR
INR: 2.5 (ref 2.0–3.0)
PT: 29.6

## 2023-08-01 NOTE — Patient Instructions (Signed)
Description   Change to 4 mg daily except 3 mg Saturday and Sunday. Return in 3 weeks.

## 2023-08-16 DIAGNOSIS — Z952 Presence of prosthetic heart valve: Secondary | ICD-10-CM | POA: Diagnosis not present

## 2023-08-16 DIAGNOSIS — Z23 Encounter for immunization: Secondary | ICD-10-CM | POA: Diagnosis not present

## 2023-08-16 DIAGNOSIS — I4821 Permanent atrial fibrillation: Secondary | ICD-10-CM | POA: Diagnosis not present

## 2023-08-16 DIAGNOSIS — Z9889 Other specified postprocedural states: Secondary | ICD-10-CM | POA: Diagnosis not present

## 2023-08-22 ENCOUNTER — Ambulatory Visit: Payer: Medicare PPO

## 2023-08-22 DIAGNOSIS — I052 Rheumatic mitral stenosis with insufficiency: Secondary | ICD-10-CM

## 2023-08-22 DIAGNOSIS — Z952 Presence of prosthetic heart valve: Secondary | ICD-10-CM

## 2023-08-22 LAB — POCT INR: INR: 2.2 (ref 2.0–3.0)

## 2023-08-22 NOTE — Patient Instructions (Signed)
Description   Change to 4 mg daily except 3 mg Sunday. Return in 3 weeks.

## 2023-08-29 ENCOUNTER — Other Ambulatory Visit: Payer: Self-pay

## 2023-08-29 MED ORDER — COMIRNATY 30 MCG/0.3ML IM SUSY
PREFILLED_SYRINGE | INTRAMUSCULAR | 0 refills | Status: DC
  Filled 2023-08-29: qty 0.3, 1d supply, fill #0

## 2023-09-04 ENCOUNTER — Other Ambulatory Visit: Payer: Self-pay | Admitting: Family Medicine

## 2023-09-04 ENCOUNTER — Telehealth: Payer: Self-pay | Admitting: Family Medicine

## 2023-09-04 DIAGNOSIS — E7800 Pure hypercholesterolemia, unspecified: Secondary | ICD-10-CM

## 2023-09-04 MED ORDER — ROSUVASTATIN CALCIUM 20 MG PO TABS: 20.0000 mg | ORAL_TABLET | Freq: Every day | ORAL | 3 refills | Status: DC

## 2023-09-04 NOTE — Telephone Encounter (Signed)
refilled 

## 2023-09-04 NOTE — Telephone Encounter (Signed)
[facility] faxed refill request for the following medications:   rosuvastatin (CRESTOR) 20 MG tablet    Please advise.

## 2023-09-12 ENCOUNTER — Other Ambulatory Visit: Payer: Self-pay | Admitting: Family Medicine

## 2023-09-12 ENCOUNTER — Ambulatory Visit: Payer: Medicare PPO

## 2023-09-12 DIAGNOSIS — G40909 Epilepsy, unspecified, not intractable, without status epilepticus: Secondary | ICD-10-CM

## 2023-09-12 DIAGNOSIS — D2272 Melanocytic nevi of left lower limb, including hip: Secondary | ICD-10-CM | POA: Diagnosis not present

## 2023-09-12 DIAGNOSIS — D2271 Melanocytic nevi of right lower limb, including hip: Secondary | ICD-10-CM | POA: Diagnosis not present

## 2023-09-12 DIAGNOSIS — D2261 Melanocytic nevi of right upper limb, including shoulder: Secondary | ICD-10-CM | POA: Diagnosis not present

## 2023-09-12 DIAGNOSIS — Z08 Encounter for follow-up examination after completed treatment for malignant neoplasm: Secondary | ICD-10-CM | POA: Diagnosis not present

## 2023-09-12 DIAGNOSIS — L821 Other seborrheic keratosis: Secondary | ICD-10-CM | POA: Diagnosis not present

## 2023-09-12 DIAGNOSIS — Z85820 Personal history of malignant melanoma of skin: Secondary | ICD-10-CM | POA: Diagnosis not present

## 2023-09-12 DIAGNOSIS — D2262 Melanocytic nevi of left upper limb, including shoulder: Secondary | ICD-10-CM | POA: Diagnosis not present

## 2023-09-12 DIAGNOSIS — D225 Melanocytic nevi of trunk: Secondary | ICD-10-CM | POA: Diagnosis not present

## 2023-09-12 DIAGNOSIS — I050 Rheumatic mitral stenosis: Secondary | ICD-10-CM

## 2023-09-12 LAB — POCT INR
INR: 2.9 (ref 2.0–3.0)
PT: 34.7

## 2023-09-12 NOTE — Patient Instructions (Signed)
Description   Continue 4 mg daily except 3 mg Sunday. Return in 3 weeks.

## 2023-09-13 NOTE — Telephone Encounter (Signed)
Requested medications are due for refill today.  yes  Requested medications are on the active medications list.  yes  Last refill. 03/15/2023 #120 5 rf  Future visit scheduled.   yes  Notes to clinic.  Refill not delegated.    Requested Prescriptions  Pending Prescriptions Disp Refills   DILANTIN 100 MG ER capsule [Pharmacy Med Name: DILANTIN 100 MG CAP] 120 capsule 5    Sig: TAKE FOUR CAPSULES BY MOUTH EVERY DAY     Not Delegated - Neurology:  Anticonvulsants - phenytoin Failed - 09/12/2023 10:18 AM      Failed - This refill cannot be delegated      Failed - HGB in normal range and within 360 days    Hemoglobin  Date Value Ref Range Status  01/05/2022 14.5 11.1 - 15.9 g/dL Final         Failed - HCT in normal range and within 360 days    Hematocrit  Date Value Ref Range Status  01/05/2022 41.0 34.0 - 46.6 % Final         Failed - PLT in normal range and within 360 days    Platelets  Date Value Ref Range Status  01/05/2022 163 150 - 450 x10E3/uL Final         Failed - WBC in normal range and within 360 days    WBC  Date Value Ref Range Status  01/05/2022 5.6 3.4 - 10.8 x10E3/uL Final  10/05/2017 6.2 3.8 - 10.8 Thousand/uL Final         Passed - ALT in normal range and within 360 days    ALT  Date Value Ref Range Status  03/15/2023 23 0 - 32 IU/L Final         Passed - AST in normal range and within 360 days    AST  Date Value Ref Range Status  03/15/2023 27 0 - 40 IU/L Final         Passed - Phenytoin (serum) in normal range and within 360 days    Phenytoin (Dilantin), Serum  Date Value Ref Range Status  03/15/2023 16.3 10.0 - 20.0 ug/mL Final    Comment:                                    Detection Limit =  0.8                           <0.8 Indicates None Detected   03/01/2016 11.7 10.0 - 20.0 ug/mL Final    Comment:                                    Neonatal:                                 Therapeutic 6.0 - 14.0                                  Detection Limit =  0.8                           <0.8 Indicates None Detected    Phenytoin,  Free  Date Value Ref Range Status  09/13/2022 1.4 1.0 - 2.0 ug/mL Final    Comment:                                    Detection Limit = 0.5         Passed - Completed PHQ-2 or PHQ-9 in the last 360 days      Passed - Patient is not pregnant      Passed - Valid encounter within last 12 months    Recent Outpatient Visits           6 months ago Cerebral seizure (HCC)   [place] Simmons-Robinson, [place], MD   1 year ago Cerebral seizure [facility])   [place] [facility] Ronnald Ramp, MD   1 year ago Cerebral seizure [facility])   [place] [facility] Bosie Clos, MD   1 year ago Hypercholesteremia   [place] [facility] Bosie Clos, MD   1 year ago Viral upper respiratory tract infection   [place] [facility] Mecum, Oswaldo Conroy, PA-C       Future Appointments             In 4 days Simmons-Robinson, Tawanna Cooler, MD [facility]   In 1 month Magnus Ivan, Vanita Panda, MD [facility]

## 2023-09-17 ENCOUNTER — Other Ambulatory Visit: Payer: Self-pay | Admitting: Family Medicine

## 2023-09-17 ENCOUNTER — Ambulatory Visit: Payer: Medicare PPO | Admitting: Family Medicine

## 2023-09-17 DIAGNOSIS — G40909 Epilepsy, unspecified, not intractable, without status epilepticus: Secondary | ICD-10-CM

## 2023-09-17 DIAGNOSIS — I480 Paroxysmal atrial fibrillation: Secondary | ICD-10-CM

## 2023-09-17 DIAGNOSIS — I10 Essential (primary) hypertension: Secondary | ICD-10-CM

## 2023-09-17 DIAGNOSIS — E7800 Pure hypercholesterolemia, unspecified: Secondary | ICD-10-CM

## 2023-09-18 DIAGNOSIS — S2242XA Multiple fractures of ribs, left side, initial encounter for closed fracture: Secondary | ICD-10-CM | POA: Diagnosis not present

## 2023-09-18 DIAGNOSIS — Z7901 Long term (current) use of anticoagulants: Secondary | ICD-10-CM | POA: Diagnosis not present

## 2023-09-18 DIAGNOSIS — S20219A Contusion of unspecified front wall of thorax, initial encounter: Secondary | ICD-10-CM | POA: Diagnosis not present

## 2023-09-18 DIAGNOSIS — W01198A Fall on same level from slipping, tripping and stumbling with subsequent striking against other object, initial encounter: Secondary | ICD-10-CM | POA: Diagnosis not present

## 2023-09-18 DIAGNOSIS — S299XXA Unspecified injury of thorax, initial encounter: Secondary | ICD-10-CM | POA: Diagnosis not present

## 2023-09-18 DIAGNOSIS — I517 Cardiomegaly: Secondary | ICD-10-CM | POA: Diagnosis not present

## 2023-09-18 LAB — COMPREHENSIVE METABOLIC PANEL
ALT: 24 [IU]/L (ref 0–32)
AST: 26 [IU]/L (ref 0–40)
Albumin: 4.1 g/dL (ref 3.9–4.9)
Alkaline Phosphatase: 166 [IU]/L — ABNORMAL HIGH (ref 44–121)
BUN/Creatinine Ratio: 11 — ABNORMAL LOW (ref 12–28)
BUN: 12 mg/dL (ref 8–27)
Bilirubin Total: 0.4 mg/dL (ref 0.0–1.2)
CO2: 25 mmol/L (ref 20–29)
Calcium: 8.9 mg/dL (ref 8.7–10.3)
Chloride: 105 mmol/L (ref 96–106)
Creatinine, Ser: 1.12 mg/dL — ABNORMAL HIGH (ref 0.57–1.00)
Globulin, Total: 3 g/dL (ref 1.5–4.5)
Glucose: 103 mg/dL — ABNORMAL HIGH (ref 70–99)
Potassium: 4.5 mmol/L (ref 3.5–5.2)
Sodium: 148 mmol/L — ABNORMAL HIGH (ref 134–144)
Total Protein: 7.1 g/dL (ref 6.0–8.5)
eGFR: 54 mL/min/{1.73_m2} — ABNORMAL LOW (ref >59)

## 2023-09-18 LAB — LIPID PANEL
Chol/HDL Ratio: 2.5 ratio (ref 0.0–4.4)
Cholesterol, Total: 202 mg/dL — ABNORMAL HIGH (ref 100–199)
HDL: 81 mg/dL (ref >39)
LDL Chol Calc (NIH): 105 mg/dL — ABNORMAL HIGH (ref 0–99)
Triglycerides: 89 mg/dL (ref 0–149)
VLDL Cholesterol Cal: 16 mg/dL (ref 5–40)

## 2023-09-18 LAB — PHENYTOIN LEVEL, TOTAL: Phenytoin (Dilantin), Serum: 17.7 ug/mL (ref 10.0–20.0)

## 2023-09-21 ENCOUNTER — Other Ambulatory Visit: Payer: Self-pay | Admitting: Family Medicine

## 2023-09-21 MED ORDER — ROSUVASTATIN CALCIUM 40 MG PO TABS: 40.0000 mg | ORAL_TABLET | Freq: Every day | ORAL | 1 refills | Status: DC

## 2023-09-25 ENCOUNTER — Ambulatory Visit: Payer: Medicare PPO

## 2023-09-25 DIAGNOSIS — Z0000 Encounter for general adult medical examination without abnormal findings: Secondary | ICD-10-CM | POA: Diagnosis not present

## 2023-09-25 NOTE — Progress Notes (Signed)
Subjective:   Victoria Davila is a 67 y.o. female who presents for Medicare Annual (Subsequent) preventive examination.  Visit Complete: Virtual I connected with  [name] on 09/25/23 by a audio enabled telemedicine application and verified that I am speaking with the correct person using two identifiers.  Patient Location: Home  Provider Location: Office/Clinic  I discussed the limitations of evaluation and management by telemedicine. The patient expressed understanding and agreed to proceed.  Vital Signs: Because this visit was a virtual/telehealth visit, some criteria may be missing or patient reported. Any vitals not documented were not able to be obtained and vitals that have been documented are patient reported.  Cardiac Risk Factors include: advanced age (>41men, >36 women);hypertension;sedentary lifestyle;obesity (BMI >30kg/m2);dyslipidemia     Objective:    Today's Vitals   09/25/23 1117  PainSc: 5    There is no height or weight on file to calculate BMI.     09/25/2023   11:25 AM 10/16/2022    2:35 PM 01/03/2018    3:11 PM 11/06/2014    1:28 PM 11/06/2014    7:38 AM 10/29/2014    8:28 AM  Advanced Directives  Does Patient Have a Medical Advance Directive? No No No Yes  Yes  Type of Theme park manager;Living will  Living will;Healthcare Power of Attorney  Does patient want to make changes to medical advance directive?    No - Patient declined    Copy of Healthcare Power of Attorney in Chart?    No - copy requested -- No - copy requested  Would patient like information on creating a medical advance directive? No - Patient declined No - Patient declined No - Patient declined       Current Medications (verified) Outpatient Encounter Medications as of 09/25/2023  Medication Sig   acetaminophen (TYLENOL) 325 MG tablet Take 1,000 mg by mouth every 6 (six) hours as needed for mild pain or moderate pain.   COVID-19 mRNA  vaccine, Pfizer, (COMIRNATY) syringe Inject into the muscle.   DILANTIN 100 MG ER capsule TAKE FOUR CAPSULES BY MOUTH EVERY DAY   metoprolol succinate (TOPROL-XL) 100 MG 24 hr tablet Take 100 mg by mouth daily.   Multiple Vitamin (MULTIVITAMIN WITH MINERALS) TABS tablet Take 1 tablet by mouth at bedtime.   Omega-3 Fatty Acids (FISH OIL) 1200 MG CAPS Take 1 capsule by mouth 2 (two) times daily.    potassium chloride (K-DUR) 10 MEQ tablet Take 3 tablets (30 mEq total) by mouth 2 (two) times daily.   psyllium (REGULOID) 0.52 G capsule Take 0.52 g by mouth 2 (two) times daily.   rosuvastatin (CRESTOR) 40 MG tablet Take 1 tablet (40 mg total) by mouth daily.   torsemide (DEMADEX) 20 MG tablet Take 2 tablets (40 mg total) by mouth 2 (two) times daily.   valACYclovir (VALTREX) 1000 MG tablet Take 1 tablet (1,000 mg total) by mouth 2 (two) times daily. For  acute flares   warfarin (COUMADIN) 3 MG tablet Take 3mg  by mouth Saturdays and Sundays   warfarin (COUMADIN) 4 MG tablet TAKE ONE TABLET BY MOUTH Monday- Friday   warfarin (COUMADIN) 5 MG tablet Take 1 tablet (5 mg total) by mouth daily.   No facility-administered encounter medications on file as of 09/25/2023.    Allergies (verified) Patient has no known allergies.   History: Past Medical History:  Diagnosis Date   Dysrhythmia    A FIB / FOLLOWED BY DR  Nolon Rod AT [facility]   Edema of both legs    CHRONIC   Hyperlipidemia    Hypertension    Mitral valve disorder    "deformed"   Neuromuscular disorder (HCC)    Rheumatoid arthritis (HCC)    RECENT DX - HAS NOT YET SEEN RHEUMATOLOGIST   Seizures (HCC)    LAST SEIZURE JAN 1995   Shortness of breath dyspnea    WITH EXERTION   Past Surgical History:  Procedure Laterality Date   ABDOMINAL HYSTERECTOMY  2012   CARDIAC ELECTROPHYSIOLOGY MAPPING AND ABLATION     CARDIAC VALVE SURGERY  2001   "heart valve stretched" for mitral valve stenosis   CERVICAL POLYPECTOMY     KNEE ARTHROSCOPY   2012   left   TOTAL HIP ARTHROPLASTY Left 11/06/2014   Procedure: LEFT TOTAL HIP ARTHROPLASTY ANTERIOR APPROACH;  Surgeon: Kathryne Hitch, MD;  Location: [facility];  Service: Orthopedics;  Laterality: Left;   Family History  Problem Relation Age of Onset   Diabetes Mother    Hip fracture Mother    Arthritis Father    Dementia Father    Cancer Sister        Thyroid Cancer s/p Thyroidectomy   Lung disease Paternal Grandfather    Social History   Socioeconomic History   Marital status: Married    Spouse name: Not on file   Number of children: Not on file   Years of education: Not on file   Highest education level: Not on file  Occupational History   Not on file  Tobacco Use   Smoking status: Never   Smokeless tobacco: Never  Vaping Use   Vaping status: Never Used  Substance and Sexual Activity   Alcohol use: No    Comment: OCCASIONAL WINE ONLY   Drug use: No   Sexual activity: Not on file  Other Topics Concern   Not on file  Social History Narrative   Not on file   Social Determinants of Health   Financial Resource Strain: Low Risk  (09/25/2023)   Overall Financial Resource Strain (CARDIA)    Difficulty of Paying Living Expenses: Not hard at all  Food Insecurity: No Food Insecurity (09/25/2023)   Hunger Vital Sign    Worried About Running Out of Food in the Last Year: Never true    Ran Out of Food in the Last Year: Never true  Transportation Needs: No Transportation Needs (09/25/2023)   PRAPARE - Administrator, Civil Service (Medical): No    Lack of Transportation (Non-Medical): No  Physical Activity: Inactive (09/25/2023)   Exercise Vital Sign    Days of Exercise per Week: 0 days    Minutes of Exercise per Session: 0 min  Stress: No Stress Concern Present (09/25/2023)   [employer] of Occupational Health - Occupational Stress Questionnaire    Feeling of Stress : Not at all  Social Connections: Moderately Isolated (09/25/2023)   Social  Connection and Isolation Panel [NHANES]    Frequency of Communication with Friends and Family: More than three times a week    Frequency of Social Gatherings with Friends and Family: Once a week    Attends Religious Services: Never    Database administrator or Organizations: No    Attends Engineer, structural: Never    Marital Status: Married    Tobacco Counseling Counseling given: Not Answered   Clinical Intake:  Pre-visit preparation completed: Yes  Pain : 0-10 Pain Score: 5  Pain Type: Acute pain Pain Location: Rib cage Pain Radiating Towards: cracked 4 ribs during a fall a week ago Pain Descriptors / Indicators: Aching, Constant Pain Onset: In the past 7 days Pain Frequency: Intermittent Pain Relieving Factors: tylenol  Pain Relieving Factors: tylenol  BMI - recorded: 38.6 Nutritional Status: BMI > 30  Obese Nutritional Risks: None Diabetes: No  How often do you need to have someone help you when you read instructions, pamphlets, or other written materials from your doctor or pharmacy?: 1 - Never  Interpreter Needed?: No  Information entered by :: Kennedy Bucker, LPN   Activities of Daily Living    09/25/2023   11:26 AM 03/15/2023   10:14 AM  In your present state of health, do you have any difficulty performing the following activities:  Hearing? 0 0  Vision? 0 0  Difficulty concentrating or making decisions? 0 1  Walking or climbing stairs? 1 1  Comment spinal, hip, and knee pain   Dressing or bathing? 0 1  Doing errands, shopping? 0 1  Preparing Food and eating ? N   Using the Toilet? N   In the past six months, have you accidently leaked urine? N   Do you have problems with loss of bowel control? N   Managing your Medications? Y   Managing your Finances? N   Housekeeping or managing your Housekeeping? N     Patient Care Team: Ronnald Ramp, MD as PCP - General (Family Medicine) Galen Manila, MD as Referring Physician  (Ophthalmology) Kathryne Hitch, MD as Consulting Physician (Orthopedic Surgery)  Indicate any recent Medical Services you may have received from other than [facility] providers in the past year (date may be approximate).     Assessment:   This is a routine wellness examination for Victoria Davila.  Hearing/Vision screen Hearing Screening - Comments:: No aids Vision Screening - Comments:: Wears glasses- Dr.Porfilio   Goals Addressed             This Visit's Progress    DIET - INCREASE WATER INTAKE         Depression Screen    09/25/2023   11:23 AM 03/15/2023   10:13 AM 10/16/2022    2:33 PM 09/13/2022    9:53 AM 08/23/2021   10:11 AM 02/23/2021   11:41 AM 01/27/2020   10:38 AM  PHQ 2/9 Scores  PHQ - 2 Score 0 0 0 0 0 0 1  PHQ- 9 Score 0 3 0  1 1 5     Fall Risk    09/25/2023   11:25 AM 03/15/2023   10:13 AM 10/16/2022    2:36 PM 09/13/2022    9:52 AM 08/23/2021   10:11 AM  Fall Risk   Falls in the past year? 1 0 0 0 0  Number falls in past yr: 1 0 0 0 0  Injury with Fall? 1 0 0 0 0  Risk for fall due to : History of fall(s) No Fall Risks No Fall Risks No Fall Risks   Follow up Falls evaluation completed;Falls prevention discussed  Falls prevention discussed;Falls evaluation completed      MEDICARE RISK AT HOME: Medicare Risk at Home Any stairs in or around the home?: Yes If so, are there any without handrails?: No Home free of loose throw rugs in walkways, pet beds, electrical cords, etc?: Yes Adequate lighting in your home to reduce risk of falls?: Yes Life alert?: No Use of a cane, walker or w/c?: Yes (cane and  walker) Grab bars in the bathroom?: Yes Shower chair or bench in shower?: Yes Elevated toilet seat or a handicapped toilet?: Yes  TIMED UP AND GO:  Was the test performed?  No    Cognitive Function:        09/25/2023   11:29 AM 10/16/2022    2:39 PM  6CIT Screen  What Year? 0 points 0 points  What month? 0 points 0 points  What time? 0  points 0 points  Count back from 20 0 points 0 points  Months in reverse 0 points 0 points  Repeat phrase 0 points 4 points  Total Score 0 points 4 points    Immunizations Immunization History  Administered Date(s) Administered   Fluad Quad(high Dose 65+) 08/18/2020, 08/10/2021, 08/23/2022   H1N1 08/28/2008   Influenza, High Dose Seasonal PF 08/16/2023   Influenza,inj,Quad PF,6+ Mos 08/11/2016, 08/01/2017, 08/28/2018, 07/08/2019   Influenza-Unspecified 08/11/2013   PFIZER Comirnaty(Gray Top)Covid-19 Tri-Sucrose Vaccine 05/17/2021   PFIZER(Purple Top)SARS-COV-2 Vaccination 12/24/2019, 01/14/2020, 08/02/2020   Pfizer(Comirnaty)Fall Seasonal Vaccine 12 years and older 10/10/2022, 08/29/2023   Tdap 07/02/2008, 11/02/2010    TDAP status: Due, Education has been provided regarding the importance of this vaccine. Advised may receive this vaccine at local pharmacy or Health Dept. Aware to provide a copy of the vaccination record if obtained from local pharmacy or Health Dept. Verbalized acceptance and understanding.  Flu Vaccine status: Up to date  Pneumococcal vaccine status: Declined,  Education has been provided regarding the importance of this vaccine but patient still declined. Advised may receive this vaccine at local pharmacy or Health Dept. Aware to provide a copy of the vaccination record if obtained from local pharmacy or Health Dept. Verbalized acceptance and understanding.   Covid-19 vaccine status: Completed vaccines  Qualifies for Shingles Vaccine? Yes   Zostavax completed No   Shingrix Completed?: No.    Education has been provided regarding the importance of this vaccine. Patient has been advised to call insurance company to determine out of pocket expense if they have not yet received this vaccine. Advised may also receive vaccine at local pharmacy or Health Dept. Verbalized acceptance and understanding.  Screening Tests Health Maintenance  Topic Date Due   Zoster  Vaccines- Shingrix (1 of 2) Never done   Pneumonia Vaccine 64+ Years old (1 of 1 - PCV) Never done   DTaP/Tdap/Td (3 - Td or Tdap) 11/02/2020   MAMMOGRAM  12/26/2023   Medicare Annual Wellness (AWV)  09/24/2024   Fecal DNA (Cologuard)  11/28/2024   INFLUENZA VACCINE  Completed   DEXA SCAN  Completed   COVID-19 Vaccine  Completed   Hepatitis C Screening  Completed   HPV VACCINES  Aged Out   Colonoscopy  Discontinued    Health Maintenance  Health Maintenance Due  Topic Date Due   Zoster Vaccines- Shingrix (1 of 2) Never done   Pneumonia Vaccine 67+ Years old (1 of 1 - PCV) Never done   DTaP/Tdap/Td (3 - Td or Tdap) 11/02/2020    Colorectal cancer screening: Type of screening: Cologuard. Completed 11/28/21. Repeat every 3 years  Mammogram status: Completed 12/25/22. Repeat every year  Bone Density status: Completed 09/20/21. Results reflect: Bone density results: NORMAL. Repeat every 5 years.  Lung Cancer Screening: (Low Dose CT Chest recommended if Age 68-80 years, 20 pack-year currently smoking OR have quit w/in 15years.) does not qualify.    Additional Screening:  Hepatitis C Screening: does qualify; Completed 04/04/12  Vision Screening: Recommended annual ophthalmology exams  for early detection of glaucoma and other disorders of the eye. Is the patient up to date with their annual eye exam?  Yes  Who is the provider or what is the name of the office in which the patient attends annual eye exams? Dr.Porfilio If pt is not established with a provider, would they like to be referred to a provider to establish care? No .   Dental Screening: Recommended annual dental exams for proper oral hygiene   Community Resource Referral / Chronic Care Management: CRR required this visit?  No   CCM required this visit?  No     Plan:     I have personally reviewed and noted the following in the patient's chart:   Medical and social history Use of alcohol, tobacco or illicit drugs   Current medications and supplements including opioid prescriptions. Patient is not currently taking opioid prescriptions. Functional ability and status Nutritional status Physical activity Advanced directives List of other physicians Hospitalizations, surgeries, and ER visits in previous 12 months Vitals Screenings to include cognitive, depression, and falls Referrals and appointments  In addition, I have reviewed and discussed with patient certain preventive protocols, quality metrics, and best practice recommendations. A written personalized care plan for preventive services as well as general preventive health recommendations were provided to patient.     Hal Hope, LPN   [id number]/29/5621   After Visit Summary: (MyChart) Due to this being a telephonic visit, the after visit summary with patients personalized plan was offered to patient via MyChart   Nurse Notes: none

## 2023-09-25 NOTE — Patient Instructions (Addendum)
Victoria Davila , Thank you for taking time to come for your Medicare Wellness Visit. I appreciate your ongoing commitment to your health goals. Please review the following plan we discussed and let me know if I can assist you in the future.   Referrals/Orders/Follow-Ups/Clinician Recommendations: none  This is a list of the screening recommended for you and due dates:  Health Maintenance  Topic Date Due   Zoster (Shingles) Vaccine (1 of 2) Never done   Pneumonia Vaccine (1 of 1 - PCV) Never done   DTaP/Tdap/Td vaccine (3 - Td or Tdap) 11/02/2020   Mammogram  12/26/2023   Medicare Annual Wellness Visit  09/24/2024   Cologuard (Stool DNA test)  11/28/2024   Flu Shot  Completed   DEXA scan (bone density measurement)  Completed   COVID-19 Vaccine  Completed   Hepatitis C Screening  Completed   HPV Vaccine  Aged Out   Colon Cancer Screening  Discontinued    Advanced directives: (ACP Link)Information on Advanced Care Planning can be found at [facility] Advance Health Care Directives Advance Health Care Directives ([email or web address]   Next Medicare Annual Wellness Visit scheduled for next year: Yes   09/30/24 @ 10:10 am by video

## 2023-10-01 ENCOUNTER — Other Ambulatory Visit (HOSPITAL_BASED_OUTPATIENT_CLINIC_OR_DEPARTMENT_OTHER): Payer: Self-pay

## 2023-10-01 ENCOUNTER — Other Ambulatory Visit (HOSPITAL_COMMUNITY): Payer: Self-pay

## 2023-10-02 NOTE — Progress Notes (Unsigned)
      Established patient visit   Patient: Victoria Davila   DOB: 08-28-1956   67 y.o. Female  MRN: [id number] Visit Date: 10/03/2023  Today's healthcare provider: Ronnald Ramp, MD   No chief complaint on file.  Subjective       Discussed the use of AI scribe software for clinical note transcription with the patient, who gave verbal consent to proceed.  History of Present Illness             Past Medical History:  Diagnosis Date   Dysrhythmia    A FIB / FOLLOWED BY DR Nolon Rod AT [facility]   Edema of both legs    CHRONIC   Hyperlipidemia    Hypertension    Mitral valve disorder    "deformed"   Neuromuscular disorder (HCC)    Rheumatoid arthritis (HCC)    RECENT DX - HAS NOT YET SEEN RHEUMATOLOGIST   Seizures (HCC)    LAST SEIZURE JAN 1995   Shortness of breath dyspnea    WITH EXERTION    Medications: Outpatient Medications Prior to Visit  Medication Sig   acetaminophen (TYLENOL) 325 MG tablet Take 1,000 mg by mouth every 6 (six) hours as needed for mild pain or moderate pain.   COVID-19 mRNA vaccine, Pfizer, (COMIRNATY) syringe Inject into the muscle.   DILANTIN 100 MG ER capsule TAKE FOUR CAPSULES BY MOUTH EVERY DAY   metoprolol succinate (TOPROL-XL) 100 MG 24 hr tablet Take 100 mg by mouth daily.   Multiple Vitamin (MULTIVITAMIN WITH MINERALS) TABS tablet Take 1 tablet by mouth at bedtime.   Omega-3 Fatty Acids (FISH OIL) 1200 MG CAPS Take 1 capsule by mouth 2 (two) times daily.    potassium chloride (K-DUR) 10 MEQ tablet Take 3 tablets (30 mEq total) by mouth 2 (two) times daily.   psyllium (REGULOID) 0.52 G capsule Take 0.52 g by mouth 2 (two) times daily.   rosuvastatin (CRESTOR) 40 MG tablet Take 1 tablet (40 mg total) by mouth daily.   torsemide (DEMADEX) 20 MG tablet Take 2 tablets (40 mg total) by mouth 2 (two) times daily.   valACYclovir (VALTREX) 1000 MG tablet Take 1 tablet (1,000 mg total) by mouth 2 (two) times daily. For  acute  flares   warfarin (COUMADIN) 3 MG tablet Take 3mg  by mouth Saturdays and Sundays   warfarin (COUMADIN) 4 MG tablet TAKE ONE TABLET BY MOUTH Monday- Friday   warfarin (COUMADIN) 5 MG tablet Take 1 tablet (5 mg total) by mouth daily.   No facility-administered medications prior to visit.    Review of Systems  {Insert previous labs (optional):23779} {See past labs  Heme  Chem  Endocrine  Serology  Results Review (optional):1}   Objective    There were no vitals taken for this visit. {Insert last BP/Wt (optional):23777}{See vitals history (optional):1}    Physical Exam  ***  No results found for any visits on 10/03/23.  Assessment & Plan     Problem List Items Addressed This Visit   None   Assessment and Plan              No follow-ups on file.         Ronnald Ramp, MD  [facility] [phone] (phone) [phone] (fax)  [facility]

## 2023-10-03 ENCOUNTER — Ambulatory Visit: Payer: Medicare PPO | Admitting: Family Medicine

## 2023-10-03 ENCOUNTER — Encounter: Payer: Self-pay | Admitting: Family Medicine

## 2023-10-03 ENCOUNTER — Ambulatory Visit: Payer: Medicare PPO

## 2023-10-03 VITALS — BP 129/68 | HR 74 | Ht 64.0 in | Wt 228.2 lb

## 2023-10-03 DIAGNOSIS — E7800 Pure hypercholesterolemia, unspecified: Secondary | ICD-10-CM

## 2023-10-03 DIAGNOSIS — I10 Essential (primary) hypertension: Secondary | ICD-10-CM | POA: Diagnosis not present

## 2023-10-03 DIAGNOSIS — Z7901 Long term (current) use of anticoagulants: Secondary | ICD-10-CM

## 2023-10-03 DIAGNOSIS — R748 Abnormal levels of other serum enzymes: Secondary | ICD-10-CM | POA: Diagnosis not present

## 2023-10-03 DIAGNOSIS — I480 Paroxysmal atrial fibrillation: Secondary | ICD-10-CM

## 2023-10-03 DIAGNOSIS — G40909 Epilepsy, unspecified, not intractable, without status epilepticus: Secondary | ICD-10-CM | POA: Diagnosis not present

## 2023-10-03 MED ORDER — WARFARIN SODIUM 3 MG PO TABS: ORAL_TABLET | ORAL | Status: DC

## 2023-10-03 MED ORDER — WARFARIN SODIUM 4 MG PO TABS: ORAL_TABLET | ORAL | Status: DC

## 2023-10-03 NOTE — Assessment & Plan Note (Signed)
Chronic  Continue metoprolol 100mg  daily  Continue torsemide 40mg  daily

## 2023-10-03 NOTE — Assessment & Plan Note (Signed)
Levels have been fluctuating. Differential includes low vitamin D, gallbladder issues, or bone-related causes. Discussed need for further testing. Most recent level .166  - Order vitamin D level - Order GGT test - Order complete metabolic panel - Consider ultrasound of the right upper quadrant if alkaline phosphatase remains elevated

## 2023-10-03 NOTE — Assessment & Plan Note (Signed)
Managed with Crestor, recently increased to 40 mg daily due to elevated cholesterol levels. Last lipid panel showed LDL at 105 and total cholesterol at 202. Discussed rechecking cholesterol in three months. - Repeat lipid panel in February 2025

## 2023-10-03 NOTE — Patient Instructions (Addendum)
It was a pleasure to see you today!  Thank you for choosing [facility] for your primary care.    To keep you healthy, please keep in mind the following health maintenance items that you are due for:   Shingrix Vaccine  Pneumonia Vaccine  Tetanus booster   Best Wishes,   Dr. Roxan Hockey

## 2023-10-03 NOTE — Assessment & Plan Note (Addendum)
Chronic  Stable  No recent seizure activity  Dilantin levels within therapeutic range at 17 on 09/17/23  Continue dilantin 100mg  daily

## 2023-10-03 NOTE — Assessment & Plan Note (Signed)
Chronic condition managed with warfarin. Current dosing is 3 mg on Saturday and Sunday, 4 mg Monday through Friday. Discussed the need for a venous stick for accurate INR measurement. Patient consented. - Order INR/PT test - Schedule PT test in two weeks

## 2023-10-04 LAB — COMPREHENSIVE METABOLIC PANEL
ALT: 24 [IU]/L (ref 0–32)
AST: 29 [IU]/L (ref 0–40)
Albumin: 4.2 g/dL (ref 3.9–4.9)
Alkaline Phosphatase: 241 [IU]/L — ABNORMAL HIGH (ref 44–121)
BUN/Creatinine Ratio: 15 (ref 12–28)
BUN: 16 mg/dL (ref 8–27)
Bilirubin Total: 0.3 mg/dL (ref 0.0–1.2)
CO2: 27 mmol/L (ref 20–29)
Calcium: 8.9 mg/dL (ref 8.7–10.3)
Chloride: 103 mmol/L (ref 96–106)
Creatinine, Ser: 1.08 mg/dL — ABNORMAL HIGH (ref 0.57–1.00)
Globulin, Total: 3.3 g/dL (ref 1.5–4.5)
Glucose: 84 mg/dL (ref 70–99)
Potassium: 4.5 mmol/L (ref 3.5–5.2)
Sodium: 146 mmol/L — ABNORMAL HIGH (ref 134–144)
Total Protein: 7.5 g/dL (ref 6.0–8.5)
eGFR: 56 mL/min/{1.73_m2} — ABNORMAL LOW (ref >59)

## 2023-10-04 LAB — PROTIME-INR
INR: 3.1 — ABNORMAL HIGH (ref 0.9–1.2)
Prothrombin Time: 32.3 s — ABNORMAL HIGH (ref 9.1–12.0)

## 2023-10-04 LAB — GAMMA GT: GGT: 218 [IU]/L — ABNORMAL HIGH (ref 0–60)

## 2023-10-04 LAB — VITAMIN D 25 HYDROXY (VIT D DEFICIENCY, FRACTURES): Vit D, 25-Hydroxy: 36 ng/mL (ref 30.0–100.0)

## 2023-10-05 ENCOUNTER — Encounter: Payer: Self-pay | Admitting: Family Medicine

## 2023-10-05 NOTE — Addendum Note (Signed)
Addended by: Bing Neighbors on: 10/05/2023 11:01 AM   Modules accepted: Orders

## 2023-10-08 ENCOUNTER — Telehealth: Payer: Self-pay | Admitting: Family Medicine

## 2023-10-08 ENCOUNTER — Other Ambulatory Visit: Payer: Self-pay | Admitting: Family Medicine

## 2023-10-08 DIAGNOSIS — R748 Abnormal levels of other serum enzymes: Secondary | ICD-10-CM

## 2023-10-08 NOTE — Telephone Encounter (Signed)
Discussed elevated alk phos with patient and family  Previously ordered RUQ [place] but patient has concern for lying flat due to recent rib fractures and chronic back pain   Pt has been holding tylenol recently and would like to recheck alk phos during 10/17/23 appt for PT follow up and schedule RUQ [place] after 10/24/23

## 2023-10-09 ENCOUNTER — Other Ambulatory Visit: Payer: Self-pay | Admitting: Family Medicine

## 2023-10-09 DIAGNOSIS — Z7901 Long term (current) use of anticoagulants: Secondary | ICD-10-CM

## 2023-10-09 DIAGNOSIS — I480 Paroxysmal atrial fibrillation: Secondary | ICD-10-CM

## 2023-10-09 NOTE — Progress Notes (Signed)
Pt will have both Pt/INR and CMP collected on 10/17/23 for lab only visit in PT clinic

## 2023-10-15 ENCOUNTER — Ambulatory Visit: Payer: Medicare PPO | Admitting: Orthopaedic Surgery

## 2023-10-15 ENCOUNTER — Encounter: Payer: Self-pay | Admitting: Orthopaedic Surgery

## 2023-10-15 DIAGNOSIS — M1712 Unilateral primary osteoarthritis, left knee: Secondary | ICD-10-CM | POA: Diagnosis not present

## 2023-10-15 DIAGNOSIS — M25562 Pain in left knee: Secondary | ICD-10-CM

## 2023-10-15 DIAGNOSIS — G8929 Other chronic pain: Secondary | ICD-10-CM

## 2023-10-15 MED ORDER — LIDOCAINE HCL 1 % IJ SOLN
3.0000 mL | INTRAMUSCULAR | Status: AC | PRN
  Administered 2023-10-15: 3 mL

## 2023-10-15 MED ORDER — METHYLPREDNISOLONE ACETATE 40 MG/ML IJ SUSP
40.0000 mg | INTRAMUSCULAR | Status: AC | PRN
  Administered 2023-10-15: 40 mg via INTRA_ARTICULAR

## 2023-10-15 NOTE — Progress Notes (Signed)
The patient comes in today requesting a steroid injection in her left knee.  We last injected her knee about 5 months ago.  She does have known arthritis in that left knee.  She is on Coumadin.  She has other comorbidities and we are trying to avoid surgery for knee replacement.  She has known arthritis in her left knee.  She has had a recent fall where she sustained fractures to between 2 and 4 ribs on the left side.  She did bruise her knee at that fall and has been sore since then.  On examination her left knee shows some bruising but overall she can flex and extend it.  It is painful.  It feels ligamentously stable.  Per her request I did place a steroid injection in her left knee without difficulty.  She knows to wait between 3 and 4 months between these types of injections.    Procedure Note  Patient: Victoria Davila             Date of Birth: [birth year]           MRN: [id number]             Visit Date: 10/15/2023  Procedures: Visit Diagnoses:  1. Chronic pain of left knee   2. Unilateral primary osteoarthritis, left knee     Large Joint Inj: L knee on 10/15/2023 1:11 PM Indications: diagnostic evaluation and pain Details: 22 G 1.5 in needle, superolateral approach  Arthrogram: No  Medications: 3 mL lidocaine 1 %; 40 mg methylPREDNISolone acetate 40 MG/ML Outcome: tolerated well, no immediate complications Procedure, treatment alternatives, risks and benefits explained, specific risks discussed. Consent was given by the patient. Immediately prior to procedure a time out was called to verify the correct patient, procedure, equipment, support staff and site/side marked as required. Patient was prepped and draped in the usual sterile fashion.

## 2023-10-17 ENCOUNTER — Other Ambulatory Visit: Payer: Self-pay | Admitting: Family Medicine

## 2023-10-17 ENCOUNTER — Ambulatory Visit: Payer: Medicare PPO

## 2023-10-17 DIAGNOSIS — I480 Paroxysmal atrial fibrillation: Secondary | ICD-10-CM

## 2023-10-17 DIAGNOSIS — R748 Abnormal levels of other serum enzymes: Secondary | ICD-10-CM | POA: Diagnosis not present

## 2023-10-18 LAB — COMPREHENSIVE METABOLIC PANEL
ALT: 25 [IU]/L (ref 0–32)
AST: 26 [IU]/L (ref 0–40)
Albumin: 4 g/dL (ref 3.9–4.9)
Alkaline Phosphatase: 196 [IU]/L — ABNORMAL HIGH (ref 44–121)
BUN/Creatinine Ratio: 14 (ref 12–28)
BUN: 14 mg/dL (ref 8–27)
Bilirubin Total: 0.3 mg/dL (ref 0.0–1.2)
CO2: 28 mmol/L (ref 20–29)
Calcium: 9 mg/dL (ref 8.7–10.3)
Chloride: 102 mmol/L (ref 96–106)
Creatinine, Ser: 1 mg/dL (ref 0.57–1.00)
Globulin, Total: 3.1 g/dL (ref 1.5–4.5)
Glucose: 81 mg/dL (ref 70–99)
Potassium: 3.9 mmol/L (ref 3.5–5.2)
Sodium: 143 mmol/L (ref 134–144)
Total Protein: 7.1 g/dL (ref 6.0–8.5)
eGFR: 61 mL/min/{1.73_m2} (ref >59)

## 2023-10-18 LAB — PROTIME-INR
INR: 2.3 — ABNORMAL HIGH (ref 0.9–1.2)
Prothrombin Time: 23.9 s — ABNORMAL HIGH (ref 9.1–12.0)

## 2023-10-19 ENCOUNTER — Other Ambulatory Visit: Payer: Self-pay | Admitting: Family Medicine

## 2023-10-19 ENCOUNTER — Telehealth: Payer: Self-pay

## 2023-10-19 DIAGNOSIS — I050 Rheumatic mitral stenosis: Secondary | ICD-10-CM

## 2023-10-19 DIAGNOSIS — I480 Paroxysmal atrial fibrillation: Secondary | ICD-10-CM

## 2023-10-19 MED ORDER — WARFARIN SODIUM 5 MG PO TABS: ORAL_TABLET | ORAL | 5 refills | Status: DC

## 2023-10-19 MED ORDER — WARFARIN SODIUM 4 MG PO TABS: ORAL_TABLET | ORAL | Status: DC

## 2023-10-19 NOTE — Telephone Encounter (Signed)
Copied from CRM [id number]. Topic: General - Other >> Oct 19, 2023  9:13 AM Turkey B wrote: Reason for CRM: pt's husband called in returning call to Marchelle Folks, also ha questions about the alkaline lab results

## 2023-10-19 NOTE — Telephone Encounter (Signed)
I spoke with patient and husband to relay results via the results encounter.

## 2023-10-19 NOTE — Progress Notes (Signed)
Inr 2.3, below goal of 2.5-3.5   Will have pt start warfarin 5mg  on sat/sun and 4mg  M-F  5mg  supply last dispensed 04/2023, additional refill sent to pharmacy in case pt needed it   Recheck INR in 3 weeks  Ronnald Ramp, MD

## 2023-11-07 ENCOUNTER — Ambulatory Visit: Payer: Medicare PPO

## 2023-11-07 DIAGNOSIS — I052 Rheumatic mitral stenosis with insufficiency: Secondary | ICD-10-CM

## 2023-11-07 LAB — POCT INR
INR: 2.9 (ref 2.0–3.0)
POC INR: 2.9
PT (Thromborel-S): 34.3

## 2023-11-13 ENCOUNTER — Other Ambulatory Visit: Payer: Self-pay | Admitting: Family Medicine

## 2023-11-13 DIAGNOSIS — Z1231 Encounter for screening mammogram for malignant neoplasm of breast: Secondary | ICD-10-CM

## 2023-11-28 ENCOUNTER — Ambulatory Visit: Payer: Medicare PPO

## 2023-11-28 DIAGNOSIS — I052 Rheumatic mitral stenosis with insufficiency: Secondary | ICD-10-CM

## 2023-11-28 LAB — POCT INR
INR: 3.5 — AB (ref 2.0–3.0)
PT: 42

## 2023-11-28 NOTE — Patient Instructions (Signed)
Description   Continue 4 mg daily except 5 mg S/S Return in 3 weeks.

## 2023-12-19 ENCOUNTER — Ambulatory Visit: Payer: Self-pay

## 2023-12-19 ENCOUNTER — Ambulatory Visit (INDEPENDENT_AMBULATORY_CARE_PROVIDER_SITE_OTHER): Payer: Medicare PPO

## 2023-12-19 DIAGNOSIS — I052 Rheumatic mitral stenosis with insufficiency: Secondary | ICD-10-CM | POA: Diagnosis not present

## 2023-12-19 LAB — POCT INR
INR: 2.9 (ref 2.0–3.0)
PT: 35.1

## 2023-12-19 NOTE — Patient Instructions (Signed)
Description   Continue 4 mg daily except 5 mg S/S Return in 4 weeks.

## 2023-12-27 ENCOUNTER — Ambulatory Visit
Admission: RE | Admit: 2023-12-27 | Discharge: 2023-12-27 | Disposition: A | Discharge status: Home / Self Care | Payer: Medicare PPO | Source: Ambulatory Visit | Attending: Family Medicine | Admitting: Family Medicine

## 2023-12-27 DIAGNOSIS — Z1231 Encounter for screening mammogram for malignant neoplasm of breast: Secondary | ICD-10-CM | POA: Insufficient documentation

## 2024-01-16 ENCOUNTER — Ambulatory Visit: Payer: Medicare PPO

## 2024-01-16 DIAGNOSIS — I052 Rheumatic mitral stenosis with insufficiency: Secondary | ICD-10-CM | POA: Diagnosis not present

## 2024-01-16 LAB — POCT INR
INR: 3.3 — AB (ref 2.0–3.0)
POC INR: 3.3
PT: 39.6

## 2024-01-16 NOTE — Patient Instructions (Signed)
 Description   Continue 4 mg daily except 5 mg S/S Return in 4 weeks.

## 2024-02-13 ENCOUNTER — Ambulatory Visit

## 2024-02-13 DIAGNOSIS — I052 Rheumatic mitral stenosis with insufficiency: Secondary | ICD-10-CM | POA: Diagnosis not present

## 2024-02-13 LAB — POCT INR
INR: 4.4 — AB (ref 2.0–3.0)
PT: 52.7

## 2024-02-13 NOTE — Patient Instructions (Signed)
 Description   Continue 4 mg daily. Return in 3 weeks.

## 2024-03-05 ENCOUNTER — Ambulatory Visit

## 2024-03-05 DIAGNOSIS — I052 Rheumatic mitral stenosis with insufficiency: Secondary | ICD-10-CM

## 2024-03-05 LAB — POCT INR
INR: 3.4 — AB (ref 2.0–3.0)
PT: 41

## 2024-03-05 NOTE — Patient Instructions (Signed)
 Description   Continue 4 mg daily. Return in 3 weeks.

## 2024-03-19 ENCOUNTER — Other Ambulatory Visit: Payer: Self-pay | Admitting: Family Medicine

## 2024-03-26 ENCOUNTER — Ambulatory Visit

## 2024-03-26 DIAGNOSIS — I052 Rheumatic mitral stenosis with insufficiency: Secondary | ICD-10-CM | POA: Diagnosis not present

## 2024-03-26 LAB — POCT INR
INR: 2.9 (ref 2.0–3.0)
PT: 35

## 2024-03-26 NOTE — Patient Instructions (Signed)
Description   Continue 4mg  daily. Return in 4 weeks.

## 2024-04-01 ENCOUNTER — Other Ambulatory Visit: Payer: Self-pay | Admitting: Family Medicine

## 2024-04-01 DIAGNOSIS — G40909 Epilepsy, unspecified, not intractable, without status epilepticus: Secondary | ICD-10-CM

## 2024-04-02 ENCOUNTER — Ambulatory Visit: Payer: Self-pay | Admitting: Family Medicine

## 2024-04-02 ENCOUNTER — Encounter: Payer: Self-pay | Admitting: Family Medicine

## 2024-04-02 VITALS — BP 128/80 | HR 63 | Ht 64.0 in | Wt 221.0 lb

## 2024-04-02 DIAGNOSIS — R748 Abnormal levels of other serum enzymes: Secondary | ICD-10-CM

## 2024-04-02 DIAGNOSIS — E7800 Pure hypercholesterolemia, unspecified: Secondary | ICD-10-CM

## 2024-04-02 DIAGNOSIS — G40909 Epilepsy, unspecified, not intractable, without status epilepticus: Secondary | ICD-10-CM

## 2024-04-02 DIAGNOSIS — L659 Nonscarring hair loss, unspecified: Secondary | ICD-10-CM

## 2024-04-02 DIAGNOSIS — I480 Paroxysmal atrial fibrillation: Secondary | ICD-10-CM

## 2024-04-02 DIAGNOSIS — E559 Vitamin D deficiency, unspecified: Secondary | ICD-10-CM | POA: Diagnosis not present

## 2024-04-02 DIAGNOSIS — I10 Essential (primary) hypertension: Secondary | ICD-10-CM | POA: Diagnosis not present

## 2024-04-02 DIAGNOSIS — Z9229 Personal history of other drug therapy: Secondary | ICD-10-CM

## 2024-04-02 DIAGNOSIS — Z6841 Body Mass Index (BMI) 40.0 and over, adult: Secondary | ICD-10-CM

## 2024-04-02 DIAGNOSIS — I052 Rheumatic mitral stenosis with insufficiency: Secondary | ICD-10-CM

## 2024-04-02 DIAGNOSIS — I5032 Chronic diastolic (congestive) heart failure: Secondary | ICD-10-CM

## 2024-04-02 NOTE — Progress Notes (Signed)
 Established patient visit   Patient: Victoria Davila   DOB: 1956-09-07   68 y.o. Female  MRN: [id number] Visit Date: 04/02/2024  Today's healthcare provider: Mimi Alt, MD   Chief Complaint  Patient presents with   Follow-up   Subjective       Discussed the use of AI scribe software for clinical note transcription with the patient, who gave verbal consent to proceed.  History of Present Illness Victoria Davila is a 68 year old female with hypertension and paroxysmal atrial fibrillation who presents for follow-up of chronic conditions.  She has a history of hypertension, which is currently managed with metoprolol  100 mg daily, maintaining stable blood pressure control.  Her paroxysmal atrial fibrillation is managed with Coumadin  therapy, with an INR goal between 2.5 and 3.5. Her last INR was 2.9 on Mar 26, 2024, which is within the goal range. 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  She has a history of seizures, which are stable with no recent seizure activity. She continues to take Dilantin  100 mg, four capsules once daily.  She is on a medication regimen for hyperlipidemia, taking 0.52 grams twice daily.  She takes crystal 40 mg daily for chronic diastolic heart failure and torsemide  20 mg, 40 mg twice daily.  She is currently on Valtrex  1000 mg twice daily for an acute flare.  She takes potassium 30 mEq by mouth twice daily.  She takes 4mg  of warfarin every day for now and has a follow up in 4 weeks for INR for anticoagulation clinic   Her husband is here to provide additional history and he has questions regarding her elevated alkaline phosphatase and they were advised to have RUQ US . She has been taking less Tylenol  and did not have the US  due to concern for her inability to lie flat. They would like to repeat labs today   They will be traveling for their new grand child and had questions about  an updated tdap which is recommended   They would also like to check the cholesterol levels after increasing Crestor  to 40mg  again   They would also like to check her A1c due to increasing the blood sugar levels as she has been drinking more sugary drinks lately   Current BMI 37.93  Past Medical History:  Diagnosis Date   Dysrhythmia    A FIB / FOLLOWED BY DR Aram Knights AT [facility]   Edema of both legs    CHRONIC   Hyperlipidemia    Hypertension    Mitral valve disorder    "deformed"   Neuromuscular disorder (HCC)    Rheumatoid arthritis (HCC)    RECENT DX - HAS NOT YET SEEN RHEUMATOLOGIST   Seizures (HCC)    LAST SEIZURE JAN 1995   Shortness of breath dyspnea    WITH EXERTION    Medications: Outpatient Medications Prior to Visit  Medication Sig   acetaminophen  (TYLENOL ) 325 MG tablet Take 1,000 mg by mouth every 6 (six) hours as needed for mild pain or moderate pain.   metoprolol  succinate (TOPROL -XL) 100 MG 24 hr tablet Take 100 mg by mouth daily.   Multiple Vitamin (MULTIVITAMIN WITH MINERALS) TABS tablet Take 1 tablet by mouth at bedtime.   Omega-3 Fatty Acids (FISH OIL) 1200 MG CAPS Take 1 capsule by mouth 2 (two) times daily.    phenytoin  (DILANTIN ) 100 MG ER capsule TAKE FOUR CAPSULES BY MOUTH ONCE DAILY  potassium chloride  (K-DUR) 10 MEQ tablet Take 3 tablets (30 mEq total) by mouth 2 (two) times daily.   psyllium (REGULOID) 0.52 G capsule Take 0.52 g by mouth 2 (two) times daily.   rosuvastatin  (CRESTOR ) 40 MG tablet TAKE 1 TABLET BY MOUTH ONCE DAILY   torsemide  (DEMADEX ) 20 MG tablet Take 2 tablets (40 mg total) by mouth 2 (two) times daily.   valACYclovir  (VALTREX ) 1000 MG tablet Take 1 tablet (1,000 mg total) by mouth 2 (two) times daily. For  acute flares   warfarin (COUMADIN ) 4 MG tablet TAKE ONE TABLET BY MOUTH Monday-Friday   COVID-19 mRNA vaccine, Pfizer, (COMIRNATY ) syringe Inject into the muscle. (Patient not taking: Reported on 04/02/2024)   warfarin  (COUMADIN ) 3 MG tablet TAKE 1 TABLET BY MOUTH ON SATURDAYS AND SUNDAYS **TO START 07/07/23** (Patient not taking: Reported on 04/02/2024)   warfarin (COUMADIN ) 5 MG tablet Take 5 mg every Saturday and Sunday (Patient not taking: Reported on 04/02/2024)   No facility-administered medications prior to visit.    Review of Systems  Last metabolic panel Lab Results  Component Value Date   GLUCOSE 81 10/17/2023   NA 143 10/17/2023   K 3.9 10/17/2023   CL 102 10/17/2023   CO2 28 10/17/2023   BUN 14 10/17/2023   CREATININE 1.00 10/17/2023   EGFR 61 10/17/2023   CALCIUM  9.0 10/17/2023   PHOS 3.8 01/22/2018   PROT 7.1 10/17/2023   ALBUMIN 4.0 10/17/2023   LABGLOB 3.1 10/17/2023   AGRATIO 1.5 03/15/2023   BILITOT 0.3 10/17/2023   ALKPHOS 196 (H) 10/17/2023   AST 26 10/17/2023   ALT 25 10/17/2023   ANIONGAP 11 11/07/2014   Last lipids Lab Results  Component Value Date   CHOL 202 (H) 09/17/2023   HDL 81 09/17/2023   LDLCALC 105 (H) 09/17/2023   TRIG 89 09/17/2023   CHOLHDL 2.5 09/17/2023   Last hemoglobin A1c Lab Results  Component Value Date   HGBA1C 5.6 03/01/2016   Last thyroid functions Lab Results  Component Value Date   TSH 3.440 09/01/2021   Last vitamin D  Lab Results  Component Value Date   VD25OH 36.0 10/03/2023        Objective    BP 128/80   Pulse 63   Ht 5\' 4"  (1.626 m)   Wt 221 lb (100.2 kg)   SpO2 99%   BMI 37.93 kg/m  BP Readings from Last 3 Encounters:  04/02/24 128/80  10/03/23 129/68  03/15/23 129/83   Wt Readings from Last 3 Encounters:  04/02/24 221 lb (100.2 kg)  10/03/23 228 lb 3.2 oz (103.5 kg)  03/15/23 232 lb 3.2 oz (105.3 kg)        Physical Exam Vitals reviewed.  Constitutional:      General: She is not in acute distress.    Appearance: Normal appearance. She is not ill-appearing.  Cardiovascular:     Rate and Rhythm: Normal rate. Rhythm irregular.     Heart sounds: Murmur heard.     Comments: Midsystolic  click Irregularly irregular  Pulmonary:     Effort: Pulmonary effort is normal. No respiratory distress.     Breath sounds: No wheezing, rhonchi or rales.  Neurological:     Mental Status: She is alert and oriented to person, place, and time. Mental status is at baseline.     Comments: USES cane for assistance with ambulation   Psychiatric:        Mood and Affect: Mood normal.  Behavior: Behavior normal.      No results found for any visits on 04/02/24.  Assessment & Plan     Problem List Items Addressed This Visit       Cardiovascular and Mediastinum   PAF (paroxysmal atrial fibrillation) (HCC)   Mitral regurgitation and mitral stenosis   Essential (primary) hypertension - Primary   Relevant Orders   Comprehensive metabolic panel with GFR   Chronic diastolic heart failure, NYHA class 2 (HCC)     Nervous and Auditory   Cerebral seizure (HCC)   Relevant Orders   Dilantin  (Phenytoin ) level, total     Other   Morbid obesity with BMI of 40.0-44.9, adult (HCC)   Relevant Orders   Hemoglobin A1c   Hypercholesteremia   Relevant Orders   Lipid panel   History of Coumadin  therapy   Elevated alkaline phosphatase measurement   Relevant Orders   Comprehensive metabolic panel with GFR   Gamma GT   Vitamin D  (25 hydroxy)   Avitaminosis D   Other Visit Diagnoses       Hair loss       Relevant Orders   TSH+T4F+T3Free        Assessment & Plan Paroxysmal Atrial Fibrillation Paroxysmal atrial fibrillation managed with warfarin. INR goal is 2.5-3.5, currently at 2.9. 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 - Continue warfarin therapy as per INR management - Follow up with cardiologist as needed  Chronic Diastolic Heart Failure Chronic diastolic heart failure managed with torsemide  and other medications. No  acute exacerbations. - Continue torsemide  20 mg, 40 mg twice daily - Continue other heart failure management medications  Hypertension Hypertension managed with metoprolol  100 mg daily. Blood pressure control is stable. - Continue metoprolol  100 mg daily   Chronic Seizure Disorder Seizure disorder managed with Dilantin . No recent seizure activity. - Continue Dilantin  100 mg, four capsules once daily - Dilantin  level ordered today  Hyperlipidemia Hyperlipidemia managed with medication. - Continue current hyperlipidemia medication regimen, Crestor  40 mg daily - Lipid panel ordered today  Vitamin D  Deficiency Vitamin D  deficiency. - vitamin D  ordered today  Acute Flare (unspecified) Currently on Valtrex  1000 mg twice daily for an acute flare. - Continue Valtrex  1000 mg twice daily   Elevated Alk phos  -repeat CMP today  - GGT,  - If alk phos levels are still elevated, recommend completing the right upper quadrant ultrasound that was previously ordered    Hair loss Physical exam notable for female pattern thinning along anterior scalp TSH ordered Recommended biotin, hair skin and nail vitamins along with her daily multivitamin If no improvement using vitamins and shampoo recommended by her dermatologist in 3 months, recommended that she schedule a virtual visit with me to make sure any medication for hair loss would not interact with her current medications, both patient and husband were in agreement with this plan    No follow-ups on file.         Mimi Alt, MD  [facility] [phone] (phone) [phone] (fax)  [facility]

## 2024-04-03 ENCOUNTER — Ambulatory Visit: Payer: Self-pay | Admitting: Family Medicine

## 2024-04-03 LAB — COMPREHENSIVE METABOLIC PANEL WITH GFR
ALT: 17 IU/L (ref 0–32)
AST: 25 IU/L (ref 0–40)
Albumin: 4.1 g/dL (ref 3.9–4.9)
Alkaline Phosphatase: 175 IU/L — ABNORMAL HIGH (ref 44–121)
BUN/Creatinine Ratio: 13 (ref 12–28)
BUN: 14 mg/dL (ref 8–27)
Bilirubin Total: 0.3 mg/dL (ref 0.0–1.2)
CO2: 21 mmol/L (ref 20–29)
Calcium: 9 mg/dL (ref 8.7–10.3)
Chloride: 103 mmol/L (ref 96–106)
Creatinine, Ser: 1.05 mg/dL — ABNORMAL HIGH (ref 0.57–1.00)
Globulin, Total: 3.1 g/dL (ref 1.5–4.5)
Glucose: 88 mg/dL (ref 70–99)
Potassium: 4.6 mmol/L (ref 3.5–5.2)
Sodium: 143 mmol/L (ref 134–144)
Total Protein: 7.2 g/dL (ref 6.0–8.5)
eGFR: 58 mL/min/{1.73_m2} — ABNORMAL LOW (ref >59)

## 2024-04-03 LAB — VITAMIN D 25 HYDROXY (VIT D DEFICIENCY, FRACTURES): Vit D, 25-Hydroxy: 36.2 ng/mL (ref 30.0–100.0)

## 2024-04-03 LAB — LIPID PANEL
Chol/HDL Ratio: 2.5 ratio (ref 0.0–4.4)
Cholesterol, Total: 187 mg/dL (ref 100–199)
HDL: 75 mg/dL (ref >39)
LDL Chol Calc (NIH): 97 mg/dL (ref 0–99)
Triglycerides: 83 mg/dL (ref 0–149)
VLDL Cholesterol Cal: 15 mg/dL (ref 5–40)

## 2024-04-03 LAB — HEMOGLOBIN A1C
Est. average glucose Bld gHb Est-mCnc: 114 mg/dL
Hgb A1c MFr Bld: 5.6 % (ref 4.8–5.6)

## 2024-04-03 LAB — PHENYTOIN LEVEL, TOTAL: Phenytoin (Dilantin), Serum: 18.3 ug/mL (ref 10.0–20.0)

## 2024-04-03 LAB — GAMMA GT: GGT: 183 IU/L — ABNORMAL HIGH (ref 0–60)

## 2024-04-03 LAB — TSH+T4F+T3FREE
Free T4: 1.47 ng/dL (ref 0.82–1.77)
T3, Free: 2.8 pg/mL (ref 2.0–4.4)
TSH: 3.11 u[IU]/mL (ref 0.450–4.500)

## 2024-04-08 ENCOUNTER — Ambulatory Visit
Admission: RE | Admit: 2024-04-08 | Discharge: 2024-04-08 | Disposition: A | Discharge status: Home / Self Care | Source: Ambulatory Visit | Attending: Family Medicine | Admitting: Family Medicine

## 2024-04-08 DIAGNOSIS — R748 Abnormal levels of other serum enzymes: Secondary | ICD-10-CM

## 2024-04-09 ENCOUNTER — Ambulatory Visit: Payer: Self-pay | Admitting: Family Medicine

## 2024-04-23 ENCOUNTER — Ambulatory Visit

## 2024-04-23 DIAGNOSIS — I052 Rheumatic mitral stenosis with insufficiency: Secondary | ICD-10-CM

## 2024-04-23 LAB — POCT INR
INR: 3.2 — AB (ref 2.0–3.0)
PT: 38.4

## 2024-04-23 NOTE — Patient Instructions (Addendum)
Description   Continue 4mg  daily. Return in 4 weeks.

## 2024-05-21 ENCOUNTER — Ambulatory Visit

## 2024-05-21 DIAGNOSIS — I052 Rheumatic mitral stenosis with insufficiency: Secondary | ICD-10-CM | POA: Diagnosis not present

## 2024-05-21 LAB — POCT INR
INR: 3.3 — AB (ref 2.0–3.0)
POC INR: 3.3
PT: 40.4

## 2024-05-21 NOTE — Patient Instructions (Signed)
Description   Continue 4mg  daily. Return in 4 weeks.

## 2024-05-26 ENCOUNTER — Encounter: Payer: Self-pay | Admitting: Orthopaedic Surgery

## 2024-05-26 ENCOUNTER — Ambulatory Visit: Admitting: Orthopaedic Surgery

## 2024-05-26 ENCOUNTER — Telehealth: Payer: Self-pay

## 2024-05-26 DIAGNOSIS — G8929 Other chronic pain: Secondary | ICD-10-CM | POA: Diagnosis not present

## 2024-05-26 DIAGNOSIS — M1712 Unilateral primary osteoarthritis, left knee: Secondary | ICD-10-CM | POA: Diagnosis not present

## 2024-05-26 DIAGNOSIS — M25562 Pain in left knee: Secondary | ICD-10-CM

## 2024-05-26 MED ORDER — METHYLPREDNISOLONE ACETATE 40 MG/ML IJ SUSP
40.0000 mg | INTRAMUSCULAR | Status: AC | PRN
  Administered 2024-05-26: 40 mg via INTRA_ARTICULAR

## 2024-05-26 MED ORDER — LIDOCAINE HCL 1 % IJ SOLN
3.0000 mL | INTRAMUSCULAR | Status: AC | PRN
  Administered 2024-05-26: 3 mL

## 2024-05-26 NOTE — Telephone Encounter (Signed)
 Left knee gel injection ?

## 2024-05-26 NOTE — Progress Notes (Signed)
 The patient is a 68 year old female well-known to us .  She does have osteoarthritis of her left knee with some chronic pain in that knee.  It has been at least 7 months since she had a steroid injection.  She does have a remote history of trying hyaluronic acid for the left knee.  She is requesting a steroid injection today since they are leaving for her trip by the end of the week.  On exam her left knee does have pain throughout the arc of motion of the knee with slight varus malalignment and known osteoarthritis of the knee with patellofemoral grinding.  I did place a steroid injection of the left knee today which she tolerated well.  She is the perfect candidate for trying hyaluronic acid because she has had that years ago and her left knee and is worth certainly trying again.  We will see if we get that approved and ordered for her in the near future.  This would be to treat the long-term pain from osteoarthritis in her left knee.  This patient is diagnosed with osteoarthritis of the knee(s).    Radiographs show evidence of joint space narrowing, osteophytes, subchondral sclerosis and/or subchondral cysts.  This patient has knee pain which interferes with functional and activities of daily living.    This patient has experienced inadequate response, adverse effects and/or intolerance with conservative treatments such as acetaminophen , NSAIDS, topical creams, physical therapy or regular exercise, knee bracing and/or weight loss.   This patient has experienced inadequate response or has a contraindication to intra articular steroid injections for at least 3 months.   This patient is not scheduled to have a total knee replacement within 6 months of starting treatment with viscosupplementation.    Procedure Note  Patient: Victoria Davila             Date of Birth: [birth year]           MRN: [id number]             Visit Date: 05/26/2024  Procedures: Visit Diagnoses:  1. Chronic pain of  left knee   2. Unilateral primary osteoarthritis, left knee     Large Joint Inj: L knee on 05/26/2024 3:05 PM Indications: diagnostic evaluation and pain Details: 22 G 1.5 in needle, superolateral approach  Arthrogram: No  Medications: 3 mL lidocaine  1 %; 40 mg methylPREDNISolone  acetate 40 MG/ML Outcome: tolerated well, no immediate complications Procedure, treatment alternatives, risks and benefits explained, specific risks discussed. Consent was given by the patient. Immediately prior to procedure a time out was called to verify the correct patient, procedure, equipment, support staff and site/side marked as required. Patient was prepped and draped in the usual sterile fashion.

## 2024-05-27 ENCOUNTER — Other Ambulatory Visit: Payer: Self-pay | Admitting: Family Medicine

## 2024-05-27 DIAGNOSIS — I480 Paroxysmal atrial fibrillation: Secondary | ICD-10-CM

## 2024-06-04 NOTE — Telephone Encounter (Signed)
 VOB submitted for Monovisc, left knee

## 2024-06-05 ENCOUNTER — Other Ambulatory Visit: Payer: Self-pay

## 2024-06-05 DIAGNOSIS — M1712 Unilateral primary osteoarthritis, left knee: Secondary | ICD-10-CM

## 2024-06-17 ENCOUNTER — Other Ambulatory Visit: Payer: Self-pay | Admitting: Family Medicine

## 2024-06-18 ENCOUNTER — Ambulatory Visit

## 2024-06-18 DIAGNOSIS — I052 Rheumatic mitral stenosis with insufficiency: Secondary | ICD-10-CM

## 2024-06-18 LAB — POCT INR
INR: 2.7 (ref 2.0–3.0)
POC INR: 2.7
PT: 32.1

## 2024-06-18 NOTE — Patient Instructions (Signed)
Description   Continue 4mg  daily. Return in 4 weeks.

## 2024-07-03 DIAGNOSIS — M3501 Sicca syndrome with keratoconjunctivitis: Secondary | ICD-10-CM | POA: Diagnosis not present

## 2024-07-03 DIAGNOSIS — H43813 Vitreous degeneration, bilateral: Secondary | ICD-10-CM | POA: Diagnosis not present

## 2024-07-03 DIAGNOSIS — Z0100 Encounter for examination of eyes and vision without abnormal findings: Secondary | ICD-10-CM | POA: Diagnosis not present

## 2024-07-03 DIAGNOSIS — D3131 Benign neoplasm of right choroid: Secondary | ICD-10-CM | POA: Diagnosis not present

## 2024-07-03 DIAGNOSIS — H2513 Age-related nuclear cataract, bilateral: Secondary | ICD-10-CM | POA: Diagnosis not present

## 2024-07-07 ENCOUNTER — Ambulatory Visit: Admitting: Orthopaedic Surgery

## 2024-07-07 ENCOUNTER — Encounter: Payer: Self-pay | Admitting: Orthopaedic Surgery

## 2024-07-07 DIAGNOSIS — M1712 Unilateral primary osteoarthritis, left knee: Secondary | ICD-10-CM | POA: Diagnosis not present

## 2024-07-07 MED ORDER — HYALURONAN 88 MG/4ML IX SOSY
88.0000 mg | PREFILLED_SYRINGE | INTRA_ARTICULAR | Status: AC | PRN
  Administered 2024-07-07: 88 mg via INTRA_ARTICULAR

## 2024-07-07 NOTE — Progress Notes (Signed)
   Procedure Note  Patient: Victoria Davila             Date of Birth: [birth year]           MRN: [id number]             Visit Date: 07/07/2024  Procedures: Visit Diagnoses:  1. Unilateral primary osteoarthritis, left knee     Large Joint Inj: L knee on 07/07/2024 9:59 AM Indications: diagnostic evaluation and pain Details: 22 G 1.5 in needle, superolateral approach  Arthrogram: No  Medications: 88 mg Hyaluronan 88 MG/4ML Outcome: tolerated well, no immediate complications Procedure, treatment alternatives, risks and benefits explained, specific risks discussed. Consent was given by the patient. Immediately prior to procedure a time out was called to verify the correct patient, procedure, equipment, support staff and site/side marked as required. Patient was prepped and draped in the usual sterile fashion.     The patient is here today for a scheduled hyaluronic acid injection with Monovisc to treat the pain from osteoarthritis in her left knee.  She and her husband have traveled recently to [place] and she felt like her right knee had giving out on her.  It is feeling better now. Not sure if it was knee or even potentially hamstrings or something muscle related.  Examination of her right side today shows no effusion of the knee and it feels lunacy stable.  There is no pain around the hamstrings but she does again get a sense that knee is going to give out on her.  Her left knee which is knee replacing hyaluronic acid in today has patellofemoral crepitation but no effusion.  We did place Monovisc in the left knee without difficulty today.  If she continues to experience problems with the right knee I would recommend outpatient physical therapy for both lower extremities.  All questions and concerns were answered and addressed.    Lot #[id number]

## 2024-07-16 ENCOUNTER — Ambulatory Visit

## 2024-07-16 DIAGNOSIS — I052 Rheumatic mitral stenosis with insufficiency: Secondary | ICD-10-CM

## 2024-07-16 LAB — POCT INR
INR: 5.9 — AB (ref 2.0–3.0)
POC INR: 5.9
PT: 70.5

## 2024-07-16 NOTE — Patient Instructions (Signed)
 Description   Hold for 4 days the continue 4 mg daily. Return in 2 weeks.

## 2024-07-30 ENCOUNTER — Ambulatory Visit

## 2024-07-30 DIAGNOSIS — I052 Rheumatic mitral stenosis with insufficiency: Secondary | ICD-10-CM | POA: Diagnosis not present

## 2024-07-30 LAB — POCT INR
INR: 5.2 — AB (ref 2.0–3.0)
POC INR: 5.2
PT: 62.7

## 2024-07-30 NOTE — Patient Instructions (Signed)
 Description   Hold for 3 days then resume 4 mg daily except 2 mg on M-W--F. Return in 2 weeks.

## 2024-08-13 ENCOUNTER — Other Ambulatory Visit: Payer: Self-pay

## 2024-08-13 ENCOUNTER — Ambulatory Visit

## 2024-08-13 DIAGNOSIS — I052 Rheumatic mitral stenosis with insufficiency: Secondary | ICD-10-CM | POA: Diagnosis not present

## 2024-08-13 LAB — POCT INR
INR: 2.9 (ref 2.0–3.0)
POC INR: 2.9
PT: 34.7

## 2024-08-13 MED ORDER — COMIRNATY 30 MCG/0.3ML IM SUSY
0.3000 mL | PREFILLED_SYRINGE | Freq: Once | INTRAMUSCULAR | 0 refills | Status: AC
  Filled 2024-08-13: qty 0.3, 1d supply, fill #0

## 2024-08-13 NOTE — Patient Instructions (Signed)
 Description   4 mg daily except 2 mg on M-W--F. Return in 2 weeks.

## 2024-09-01 ENCOUNTER — Encounter: Payer: Self-pay | Admitting: Radiology

## 2024-09-02 ENCOUNTER — Other Ambulatory Visit: Payer: Self-pay

## 2024-09-02 ENCOUNTER — Encounter: Payer: Self-pay | Admitting: Physician Assistant

## 2024-09-02 ENCOUNTER — Ambulatory Visit: Admitting: Physician Assistant

## 2024-09-02 DIAGNOSIS — M1712 Unilateral primary osteoarthritis, left knee: Secondary | ICD-10-CM | POA: Diagnosis not present

## 2024-09-02 DIAGNOSIS — M25562 Pain in left knee: Secondary | ICD-10-CM | POA: Diagnosis not present

## 2024-09-02 DIAGNOSIS — G8929 Other chronic pain: Secondary | ICD-10-CM

## 2024-09-02 MED ORDER — LIDOCAINE HCL 1 % IJ SOLN
4.0000 mL | INTRAMUSCULAR | Status: AC | PRN
  Administered 2024-09-02: 4 mL

## 2024-09-02 MED ORDER — METHYLPREDNISOLONE ACETATE 40 MG/ML IJ SUSP
40.0000 mg | INTRAMUSCULAR | Status: AC | PRN
  Administered 2024-09-02: 40 mg via INTRA_ARTICULAR

## 2024-09-02 NOTE — Progress Notes (Signed)
 Office Visit Note   Patient: Victoria Davila           Date of Birth: [birth year]           MRN: [id number] Visit Date: 09/02/2024              Requested by: Sharma Coyer, MD [address] [place],  [place] [address] PCP: Sharma Coyer, MD  Chief Complaint  Patient presents with  . Left Knee - Follow-up      HPI: Patient is a 68 year old woman who is a patient of Dr. Damian.  She has a history of osteoarthritis of her left knee.  She had a recent fall which was caused and placed pain in her left knee.  Last injection was a gel injection in September.  Wondering if maybe a cortisone injection would help today she said she was getting up to the bathroom and may have lost her balance and hit the inside of her left knee on the commode.  Denies any instability.  She has been able to walk on it with her walker.  Assessment & Plan: Visit Diagnoses:  1. Unilateral primary osteoarthritis, left knee   2. Chronic pain of left knee     Plan: X-rays taken today and compared to previous x-rays taken about almost 2 years ago I cannot appreciate any osseous inconsistencies in the cortices.  She may have just because her arthritis could be more symptomatic or may have some bone bruising I do not appreciate anything that would prevent me from giving her steroid injection.  Would like her to make a follow-up appointment with Dr. Vernetta  Follow-Up Instructions: Return in about 1 month (around 10/02/2024).   Ortho Exam  Patient is alert, oriented, no adenopathy, well-dressed, normal affect, normal respiratory effort. Left knee small effusion but no erythema no ecchymosis compartments are soft and nontender she can extend and flex her knee more tender medially than laterally good varus valgus stability    Imaging: No results found. No images are attached to the encounter.  Labs: Lab Results  Component Value Date   HGBA1C 5.6 04/02/2024    HGBA1C 5.6 03/01/2016   HGBA1C 5.7 09/28/2014   ESRSEDRATE 65 (H) 01/22/2018   ESRSEDRATE 16 05/25/2017   LABURIC 8.1 (H) 05/25/2017     Lab Results  Component Value Date   ALBUMIN 4.1 04/02/2024   ALBUMIN 4.0 10/17/2023   ALBUMIN 4.2 10/03/2023    No results found for: MG Lab Results  Component Value Date   VD25OH 36.2 04/02/2024   VD25OH 36.0 10/03/2023   VD25OH 35.7 03/01/2016    No results found for: PREALBUMIN    Latest Ref Rng & Units 01/05/2022    9:38 AM 09/01/2021   10:59 AM 02/25/2021    8:06 AM  CBC EXTENDED  WBC 3.4 - 10.8 x10E3/uL 5.6  5.2  5.9   RBC 3.77 - 5.28 x10E6/uL 4.60  4.65  4.60   Hemoglobin 11.1 - 15.9 g/dL 85.4  85.3  85.4   HCT 34.0 - 46.6 % 41.0  41.0  41.3   Platelets 150 - 450 x10E3/uL 163  180  159   NEUT# 1.4 - 7.0 x10E3/uL 3.0  2.8  3.5   Lymph# 0.7 - 3.1 x10E3/uL 2.0  1.8  1.6      There is no height or weight on file to calculate BMI.  Orders:  Orders Placed This Encounter  Procedures  . XR KNEE 3 VIEW LEFT  No orders of the defined types were placed in this encounter.    Procedures: Large Joint Inj: L knee on 09/02/2024 1:51 PM Indications: pain and diagnostic evaluation Details: 25 G 1.5 in needle, anteromedial approach  Arthrogram: No  Medications: 40 mg methylPREDNISolone  acetate 40 MG/ML; 4 mL lidocaine  1 % Outcome: tolerated well, no immediate complications Procedure, treatment alternatives, risks and benefits explained, specific risks discussed. Consent was given by the patient.     Clinical Data: No additional findings.  ROS:  All other systems negative, except as noted in the HPI. Review of Systems  Objective: Vital Signs: There were no vitals taken for this visit.  Specialty Comments:  No specialty comments available.  PMFS History: Patient Active Problem List   Diagnosis Date Noted  . Elevated alkaline phosphatase measurement 10/03/2023  . Healthcare maintenance 03/15/2023  . Unilateral  primary osteoarthritis, left knee 11/15/2022  . Sore throat 12/10/2020  . COVID-19 12/10/2020  . Delayed surgical wound healing 01/03/2018  . 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 Cervical spine arthritis 11/09/2017  . H/O maze procedure 11/09/2017  . H/O tricuspid valve repair 11/09/2017  . H/O mitral valve replacement with mechanical valve 11/09/2017  . History of Coumadin  therapy 11/06/2017  . Chronic diastolic heart failure, NYHA class 2 (HCC) 08/30/2017  . Chronic bilateral low back pain 10/13/2016  . Recurrent oral herpes simplex 04/30/2015  . Arthritis 04/15/2015  . Gonalgia 04/15/2015  . Cervical polyp 04/15/2015  . Edema 04/15/2015  . Essential (primary) hypertension 04/15/2015  . H/O head injury 04/15/2015  . Hypercholesteremia 04/15/2015  . Mitral regurgitation and mitral stenosis 04/15/2015  . Morbid obesity with BMI of 40.0-44.9, adult (HCC) 04/15/2015  . AF (paroxysmal atrial fibrillation) (HCC) 04/15/2015  . RBBB (right bundle branch block) 04/15/2015  . Cerebral seizure (HCC) 04/15/2015  . 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 Urge incontinence 04/15/2015  . Avitaminosis D 04/15/2015  . Arthritis of right hip 11/06/2014  . 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 Breath shortness 07/28/2014  . PAF (paroxysmal atrial fibrillation) (HCC) 07/28/2014   Past Medical History:  Diagnosis Date  . Dysrhythmia    A FIB / FOLLOWED BY DR PRENTICE FLATTEN AT [facility]  . Edema of both legs    CHRONIC  . Hyperlipidemia   . Hypertension   . Mitral valve disorder    deformed  . Neuromuscular disorder (HCC)   . Rheumatoid arthritis (HCC)    RECENT DX - HAS NOT YET SEEN RHEUMATOLOGIST  . Seizures (HCC)    LAST SEIZURE JAN 1995  . Shortness of breath dyspnea    WITH EXERTION    Family History  Problem Relation Age of Onset  . Diabetes Mother   . Hip fracture Mother   . Arthritis Father   . Dementia Father   . Cancer Sister        Thyroid Cancer  s/p Thyroidectomy  . Lung disease Paternal Grandfather     Past Surgical History:  Procedure Laterality Date  . ABDOMINAL HYSTERECTOMY  2012  . CARDIAC ELECTROPHYSIOLOGY MAPPING AND ABLATION    . CARDIAC VALVE SURGERY  2001   heart valve stretched for mitral valve stenosis  . CERVICAL POLYPECTOMY    . KNEE ARTHROSCOPY  2012   left  . TOTAL HIP ARTHROPLASTY Left 11/06/2014   Procedure: LEFT TOTAL HIP ARTHROPLASTY ANTERIOR APPROACH;  Surgeon: Lonni CINDERELLA Poli, MD;  Location: [facility];  Service: Orthopedics;  Laterality: Left;   Social History  Occupational History  . Not on file  Tobacco Use  . Smoking status: Never  . Smokeless tobacco: Never  Vaping Use  . Vaping status: Never Used  Substance and Sexual Activity  . Alcohol use: No    Comment: OCCASIONAL WINE ONLY  . Drug use: No  . Sexual activity: Not on file

## 2024-09-03 ENCOUNTER — Other Ambulatory Visit: Payer: Self-pay

## 2024-09-03 ENCOUNTER — Ambulatory Visit (INDEPENDENT_AMBULATORY_CARE_PROVIDER_SITE_OTHER)

## 2024-09-03 DIAGNOSIS — I052 Rheumatic mitral stenosis with insufficiency: Secondary | ICD-10-CM | POA: Diagnosis not present

## 2024-09-03 LAB — POCT INR
INR: 3.5 — AB (ref 2.0–3.0)
POC INR: 3.5
PT: 42.2

## 2024-09-03 MED ORDER — FLUZONE HIGH-DOSE 0.5 ML IM SUSY
0.5000 mL | PREFILLED_SYRINGE | Freq: Once | INTRAMUSCULAR | 0 refills | Status: AC
  Filled 2024-09-03: qty 0.5, 1d supply, fill #0

## 2024-09-03 NOTE — Patient Instructions (Signed)
 Description   4 mg daily except 2 mg on M-W--F. Return in 2 weeks.

## 2024-09-10 ENCOUNTER — Other Ambulatory Visit: Payer: Self-pay | Admitting: Family Medicine

## 2024-09-10 DIAGNOSIS — I480 Paroxysmal atrial fibrillation: Secondary | ICD-10-CM

## 2024-09-17 ENCOUNTER — Ambulatory Visit

## 2024-09-17 DIAGNOSIS — I052 Rheumatic mitral stenosis with insufficiency: Secondary | ICD-10-CM

## 2024-09-17 LAB — POCT INR
INR: 2.3 (ref 2.0–3.0)
POC INR: 2.3
PT: 27.3

## 2024-09-17 NOTE — Progress Notes (Signed)
Patient presents for PT/INR.

## 2024-09-17 NOTE — Patient Instructions (Signed)
 Description   4 mg daily except 2 mg on M--F. Return in 3 weeks.

## 2024-09-18 DIAGNOSIS — Z952 Presence of prosthetic heart valve: Secondary | ICD-10-CM | POA: Diagnosis not present

## 2024-09-18 DIAGNOSIS — I480 Paroxysmal atrial fibrillation: Secondary | ICD-10-CM | POA: Diagnosis not present

## 2024-09-30 ENCOUNTER — Ambulatory Visit: Payer: Self-pay

## 2024-09-30 DIAGNOSIS — Z1211 Encounter for screening for malignant neoplasm of colon: Secondary | ICD-10-CM

## 2024-09-30 DIAGNOSIS — Z0000 Encounter for general adult medical examination without abnormal findings: Secondary | ICD-10-CM | POA: Diagnosis not present

## 2024-09-30 DIAGNOSIS — Z1231 Encounter for screening mammogram for malignant neoplasm of breast: Secondary | ICD-10-CM

## 2024-09-30 NOTE — Patient Instructions (Addendum)
 Ms. Freeberg,  Thank you for taking the time for your Medicare Wellness Visit. I appreciate your continued commitment to your health goals. Please review the care plan we discussed, and feel free to reach out if I can assist you further.  Please note that Annual Wellness Visits do not include a physical exam. Some assessments may be limited, especially if the visit was conducted virtually. If needed, we may recommend an in-person follow-up with your provider.  Ongoing Care Seeing your primary care provider every 3 to 6 months helps us  monitor your health and provide consistent, personalized care.   Referrals If a referral was made during today's visit and you haven't received any updates within two weeks, please contact the referred provider directly to check on the status.  REFERRALS SENT FOR MAMMOGRAM & COLOGUARD You have an order for:  []   2D Mammogram  [x]   3D Mammogram  []   Bone Density     Please call for appointment: [facility] [phone]   Make sure to wear two-piece clothing.  No lotions, powders, or deodorants the day of the appointment. Make sure to bring picture ID and insurance card.  Bring list of medications you are currently taking including any supplements.   Schedule your [place] screening mammogram through MyChart!   Log into your MyChart account.  Go to 'Visit' (or 'Appointments' if on mobile App) --> Schedule an Appointment  Under 'Select a Reason for Visit' choose the Mammogram Screening option.  Complete the pre-visit questions and select the time and place that best fits your schedule.   Recommended Screenings:  Health Maintenance  Topic Date Due   Pneumococcal Vaccine for age over 43 (1 of 2 - PCV) Never done   Zoster (Shingles) Vaccine (1 of 2) Never done   DTaP/Tdap/Td vaccine (3 - Td or Tdap) 11/02/2020   Cologuard (Stool DNA test)  11/28/2024   Breast Cancer Screening  12/26/2024   COVID-19 Vaccine (8 - 2025-26 season)  02/11/2025   Medicare Annual Wellness Visit  09/30/2025   Osteoporosis screening with Bone Density Scan  09/20/2026   Flu Shot  Completed   Hepatitis C Screening  Completed   Meningitis B Vaccine  Aged Out   Colon Cancer Screening  Discontinued     Vision: Annual vision screenings are recommended for early detection of glaucoma, cataracts, and diabetic retinopathy. These exams can also reveal signs of chronic conditions such as diabetes and high blood pressure.  Dental: Annual dental screenings help detect early signs of oral cancer, gum disease, and other conditions linked to overall health, including heart disease and diabetes.  Please see the attached documents for additional preventive care recommendations.   NEXT AWV  09/30/25 @ 10:10 AM BY VIDEO

## 2024-09-30 NOTE — Progress Notes (Signed)
 No chief complaint on file.   I connected with  Victoria Davila on 09/30/24 by a video and audio enabled telemedicine application and verified that I am speaking with the correct person using two identifiers.  Patient Location: Home  Provider Location: Office/Clinic  Persons Participating in Visit: Patient.  I discussed the limitations of evaluation and management by telemedicine. The patient expressed understanding and agreed to proceed.   Vital Signs: Because this visit was a virtual/telehealth visit, some criteria may be missing or patient reported. Any vitals not documented were not able to be obtained and vitals that have been documented are patient reported.      Subjective:   Victoria Davila is a 68 y.o. female who presents for a Medicare Annual Wellness Visit.  No data recorded  Allergies (verified) Patient has no known allergies.   Current Medications (verified) Outpatient Encounter Medications as of 09/30/2024  Medication Sig   acetaminophen  (TYLENOL ) 325 MG tablet Take 1,000 mg by mouth every 6 (six) hours as needed for mild pain or moderate pain.   COVID-19 mRNA vaccine, Pfizer, (COMIRNATY ) syringe Inject into the muscle. (Patient not taking: Reported on 04/02/2024)   metoprolol  succinate (TOPROL -XL) 100 MG 24 hr tablet Take 100 mg by mouth daily.   Multiple Vitamin (MULTIVITAMIN WITH MINERALS) TABS tablet Take 1 tablet by mouth at bedtime.   Omega-3 Fatty Acids (FISH OIL) 1200 MG CAPS Take 1 capsule by mouth 2 (two) times daily.    phenytoin  (DILANTIN ) 100 MG ER capsule TAKE FOUR CAPSULES BY MOUTH ONCE DAILY   potassium chloride  (K-DUR) 10 MEQ tablet Take 3 tablets (30 mEq total) by mouth 2 (two) times daily.   psyllium (REGULOID) 0.52 G capsule Take 0.52 g by mouth 2 (two) times daily.   rosuvastatin  (CRESTOR ) 40 MG tablet TAKE 1 TABLET BY MOUTH ONCE DAILY   torsemide  (DEMADEX ) 20 MG tablet Take 2 tablets (40 mg total) by mouth 2 (two) times daily.    valACYclovir  (VALTREX ) 1000 MG tablet Take 1 tablet (1,000 mg total) by mouth 2 (two) times daily. For  acute flares   warfarin (COUMADIN ) 3 MG tablet TAKE 1 TABLET BY MOUTH ON SATURDAYS AND SUNDAYS **TO START 07/07/23** (Patient not taking: Reported on 04/02/2024)   warfarin (COUMADIN ) 4 MG tablet TAKE ONE TABLET BY MOUTH EVERY DAY ALONGWITH A 3MG  TABLET   warfarin (COUMADIN ) 5 MG tablet Take 5 mg every Saturday and Sunday (Patient not taking: Reported on 04/02/2024)   No facility-administered encounter medications on file as of 09/30/2024.    History: Past Medical History:  Diagnosis Date   Dysrhythmia    A FIB / FOLLOWED BY DR PRENTICE FLATTEN AT [facility]   Edema of both legs    CHRONIC   Hyperlipidemia    Hypertension    Mitral valve disorder    deformed   Neuromuscular disorder (HCC)    Rheumatoid arthritis (HCC)    RECENT DX - HAS NOT YET SEEN RHEUMATOLOGIST   Seizures (HCC)    LAST SEIZURE JAN 1995   Shortness of breath dyspnea    WITH EXERTION   Past Surgical History:  Procedure Laterality Date   ABDOMINAL HYSTERECTOMY  2012   CARDIAC ELECTROPHYSIOLOGY MAPPING AND ABLATION     CARDIAC VALVE SURGERY  2001   heart valve stretched for mitral valve stenosis   CERVICAL POLYPECTOMY     KNEE ARTHROSCOPY  2012   left   TOTAL HIP ARTHROPLASTY Left 11/06/2014   Procedure: LEFT TOTAL HIP  ARTHROPLASTY ANTERIOR APPROACH;  Surgeon: Lonni CINDERELLA Poli, MD;  Location: [facility];  Service: Orthopedics;  Laterality: Left;   Family History  Problem Relation Age of Onset   Diabetes Mother    Hip fracture Mother    Arthritis Father    Dementia Father    Cancer Sister        Thyroid Cancer s/p Thyroidectomy   Lung disease Paternal Grandfather    Social History   Occupational History   Not on file  Tobacco Use   Smoking status: Never   Smokeless tobacco: Never  Vaping Use   Vaping status: Never Used  Substance and Sexual Activity   Alcohol use: No    Comment: OCCASIONAL WINE ONLY    Drug use: No   Sexual activity: Not on file   Tobacco Counseling Counseling given: Not Answered  SDOH Screenings   Food Insecurity: No Food Insecurity (09/25/2023)  Housing: Low Risk  (09/25/2023)  Transportation Needs: No Transportation Needs (09/25/2023)  Utilities: Not At Risk (09/25/2023)  Alcohol Screen: Low Risk  (09/25/2023)  Depression (PHQ2-9): Low Risk  (09/25/2023)  Financial Resource Strain: Low Risk  (09/25/2023)  Physical Activity: Inactive (09/25/2023)  Social Connections: Moderately Isolated (09/25/2023)  Stress: No Stress Concern Present (09/25/2023)  Tobacco Use: Low Risk  (09/02/2024)  Health Literacy: Adequate Health Literacy (09/25/2023)   See flowsheets for full screening details  Depression Screen     Goals Addressed   None          Objective:    There were no vitals filed for this visit. There is no height or weight on file to calculate BMI.  Hearing/Vision screen No results found. Immunizations and Health Maintenance Health Maintenance  Topic Date Due   Pneumococcal Vaccine: 50+ Years (1 of 2 - PCV) Never done   Zoster Vaccines- Shingrix (1 of 2) Never done   DTaP/Tdap/Td (3 - Td or Tdap) 11/02/2020   Medicare Annual Wellness (AWV)  09/24/2024   Fecal DNA (Cologuard)  11/28/2024   Mammogram  12/26/2024   COVID-19 Vaccine (8 - 2025-26 season) 02/11/2025   Influenza Vaccine  Completed   Bone Density Scan  Completed   Hepatitis C Screening  Completed   Meningococcal B Vaccine  Aged Out   Colonoscopy  Discontinued        Assessment/Plan:  This is a routine wellness examination for Victoria Davila.  Patient Care Team: Sharma Coyer, MD as PCP - General (Family Medicine) Jaye Fallow, MD as Referring Physician (Ophthalmology) Poli Lonni CINDERELLA, MD as Consulting Physician (Orthopedic Surgery)  I have personally reviewed and noted the following in the patient's chart:   Medical and social history Use of alcohol,  tobacco or illicit drugs  Current medications and supplements including opioid prescriptions. Functional ability and status Nutritional status Physical activity Advanced directives List of other physicians Hospitalizations, surgeries, and ER visits in previous 12 months Vitals Screenings to include cognitive, depression, and falls Referrals and appointments  No orders of the defined types were placed in this encounter.  In addition, I have reviewed and discussed with patient certain preventive protocols, quality metrics, and best practice recommendations. A written personalized care plan for preventive services as well as general preventive health recommendations were provided to patient.   Jhonnie GORMAN Das, LPN   [id number]   No follow-ups on file.  After Visit Summary: (MyChart) Due to this being a telephonic visit, the after visit summary with patients personalized plan was offered to patient via MyChart   Nurse  Notes: UTD ON COVID, TDAP & FLU; NEEDS SHINGRIX & PNA; MAMMOGRAM & COLOGUARD REFERRALS SENT; UTD ON BDS

## 2024-10-02 ENCOUNTER — Ambulatory Visit: Payer: Self-pay

## 2024-10-02 ENCOUNTER — Ambulatory Visit: Admitting: Family Medicine

## 2024-10-02 ENCOUNTER — Encounter: Payer: Self-pay | Admitting: Family Medicine

## 2024-10-02 ENCOUNTER — Encounter: Admitting: Orthopaedic Surgery

## 2024-10-02 VITALS — BP 121/65 | HR 64 | Ht 64.0 in | Wt 210.4 lb

## 2024-10-02 DIAGNOSIS — I480 Paroxysmal atrial fibrillation: Secondary | ICD-10-CM

## 2024-10-02 DIAGNOSIS — I5032 Chronic diastolic (congestive) heart failure: Secondary | ICD-10-CM

## 2024-10-02 DIAGNOSIS — I10 Essential (primary) hypertension: Secondary | ICD-10-CM | POA: Diagnosis not present

## 2024-10-02 DIAGNOSIS — I052 Rheumatic mitral stenosis with insufficiency: Secondary | ICD-10-CM | POA: Diagnosis not present

## 2024-10-02 DIAGNOSIS — N3941 Urge incontinence: Secondary | ICD-10-CM

## 2024-10-02 DIAGNOSIS — Z23 Encounter for immunization: Secondary | ICD-10-CM

## 2024-10-02 LAB — POCT INR
INR: 2.4 (ref 2.0–3.0)
PT: 29.2

## 2024-10-02 MED ORDER — WARFARIN SODIUM 3 MG PO TABS: ORAL_TABLET | ORAL | 3 refills | Status: AC

## 2024-10-02 MED ORDER — SOLIFENACIN SUCCINATE 5 MG PO TABS: 5.0000 mg | ORAL_TABLET | Freq: Every day | ORAL | 3 refills | Status: AC

## 2024-10-02 NOTE — Patient Instructions (Signed)
 Description   4 mg daily except 3 mg on M--F. Return in 2 weeks.

## 2024-10-02 NOTE — Assessment & Plan Note (Signed)
 Chronic condition  Well controlled with current medication regimen. - Continue metoprolol  100 mg daily

## 2024-10-02 NOTE — Assessment & Plan Note (Signed)
 Urge incontinence Chronic  Present for six months. Discussed starting VESIcare to manage symptoms. Potential side effects include constipation and dry mouth. Plan to monitor for fluid retention and adjust dosage as needed. - Started VESIcare 5 mg once daily - Instructed to monitor for side effects such as constipation and dry mouth - Instructed to monitor for signs of fluid retention - Will consider increasing VESIcare to 10 mg if tolerated after one week

## 2024-10-02 NOTE — Progress Notes (Signed)
 Established patient visit   Patient: Victoria Davila   DOB: 17-Jan-1956   68 y.o. Female  MRN: [id number] Visit Date: 10/02/2024  Today's healthcare provider: Rockie Agent, MD   Chief Complaint  Patient presents with   Medical Management of Chronic Issues    Patient is present for follow up with PCP, in need of PT INR today     Urinary Incontinence    Patient has been experiencing incontinence for close to 6 months, wanted to discuss some options to help with this    Subjective     HPI     Medical Management of Chronic Issues    Additional comments: Patient is present for follow up with PCP, in need of PT INR today          Urinary Incontinence    Additional comments: Patient has been experiencing incontinence for close to 6 months, wanted to discuss some options to help with this       Last edited by Rosas, Joseline E, CMA on 10/02/2024 10:13 AM.       Discussed the use of AI scribe software for clinical note transcription with the patient, who gave verbal consent to proceed.  History of Present Illness Victoria Davila is a 68 year old female with atrial fibrillation and chronic heart failure who presents for a chronic follow-up.  She has a history of atrial fibrillation and is on metoprolol  100 mg daily and Coumadin  with varying doses throughout the week. Her PT/INR is 2.4, with a target range of 2.5 to 3.5. Adjustments to her Coumadin  dosage have been made in the past to maintain therapeutic levels.  She has chronic diastolic heart failure, class II, and takes torsemide  40 mg twice daily. She uses compression socks for lower extremity edema.  She experiences urge incontinence for six months, with difficulty holding her bladder until reaching the bathroom, sometimes resulting in accidents. Her caregiver notes she sometimes does not feel the urge until standing up. She has fallen due to knee buckling, leading to increased supervision during  activities.  She has a history of cerebral seizures but reports no recent seizure activity. She takes Dilantin  400 mg daily, and her levels will be monitored to ensure they remain within the therapeutic range.  She has a history of mitral regurgitation and stenosis, trifascicular block, right bundle branch block, and paroxysmal atrial fibrillation. She also has a history of vitamin D  deficiency.  She has knee issues described as 'bone on bone' and has received gel and cortisone shots in the past. She experiences knee buckling and has back and hip problems, which sometimes cause her hip to feel weak. She has a history of falls and uses a walker for mobility.     Past Medical History:  Diagnosis Date   Dysrhythmia    A FIB / FOLLOWED BY DR PRENTICE FLATTEN AT [facility]   Edema of both legs    CHRONIC   Hyperlipidemia    Hypertension    Mitral valve disorder    deformed   Neuromuscular disorder (HCC)    Rheumatoid arthritis (HCC)    RECENT DX - HAS NOT YET SEEN RHEUMATOLOGIST   Seizures (HCC)    LAST SEIZURE JAN 1995   Shortness of breath dyspnea    WITH EXERTION    Medications: Outpatient Medications Prior to Visit  Medication Sig   acetaminophen  (TYLENOL ) 325 MG tablet Take 1,000 mg by mouth every 6 (six) hours as needed for  mild pain or moderate pain.   metoprolol  succinate (TOPROL -XL) 100 MG 24 hr tablet Take 100 mg by mouth daily.   Multiple Vitamin (MULTIVITAMIN WITH MINERALS) TABS tablet Take 1 tablet by mouth at bedtime.   Omega-3 Fatty Acids (FISH OIL) 1200 MG CAPS Take 1 capsule by mouth 2 (two) times daily.    phenytoin  (DILANTIN ) 100 MG ER capsule TAKE FOUR CAPSULES BY MOUTH ONCE DAILY   potassium chloride  (K-DUR) 10 MEQ tablet Take 3 tablets (30 mEq total) by mouth 2 (two) times daily.   psyllium (REGULOID) 0.52 G capsule Take 0.52 g by mouth 2 (two) times daily.   rosuvastatin  (CRESTOR ) 40 MG tablet TAKE 1 TABLET BY MOUTH ONCE DAILY   torsemide  (DEMADEX ) 20 MG tablet Take  2 tablets (40 mg total) by mouth 2 (two) times daily.   valACYclovir  (VALTREX ) 1000 MG tablet Take 1 tablet (1,000 mg total) by mouth 2 (two) times daily. For  acute flares (Patient taking differently: Take 1,000 mg by mouth 2 (two) times daily. For  acute flares- TAKES PRN)   warfarin (COUMADIN ) 4 MG tablet TAKE ONE TABLET BY MOUTH EVERY DAY ALONGWITH A 3MG  TABLET (Patient taking differently: Take 4 mg by mouth. TAKES 1/2 TAB TWICE PER WEEK- ALL OTHER DAYS TAKING 4MG )   [DISCONTINUED] COVID-19 mRNA vaccine, Pfizer, (COMIRNATY ) syringe Inject into the muscle.   [DISCONTINUED] warfarin (COUMADIN ) 3 MG tablet TAKE 1 TABLET BY MOUTH ON SATURDAYS AND SUNDAYS **TO START 07/07/23** (Patient not taking: Reported on 09/30/2024)   [DISCONTINUED] warfarin (COUMADIN ) 5 MG tablet Take 5 mg every Saturday and Sunday (Patient not taking: Reported on 09/30/2024)   No facility-administered medications prior to visit.    Review of Systems      Objective    BP 121/65 (BP Location: Left Arm, Patient Position: Sitting, Cuff Size: Normal)   Pulse 64   Ht 5' 4 (1.626 m)   Wt 210 lb 6.4 oz (95.4 kg)   SpO2 100%   BMI 36.12 kg/m  BP Readings from Last 3 Encounters:  10/02/24 121/65  04/02/24 128/80  10/03/23 129/68   Wt Readings from Last 3 Encounters:  10/02/24 210 lb 6.4 oz (95.4 kg)  04/02/24 221 lb (100.2 kg)  10/03/23 228 lb 3.2 oz (103.5 kg)        Physical Exam  Physical Exam MEASUREMENTS: Weight- 210. GENERAL: No acute distress, seated in wheelchair. CHEST: Lungs clear to auscultation, no wheezing or crackles. CARDIOVASCULAR: Systolic click, heart murmur, irregular rate and rhythm. EXTREMITIES: 2+ pitting edema on bilateral lower extremities. NEUROLOGICAL: Alert and oriented x4.    Results for orders placed or performed in visit on 10/02/24  POCT INR  Result Value Ref Range   INR 2.4 2.0 - 3.0   PT 29.2     Assessment & Plan     Problem List Items Addressed This Visit     AF  (paroxysmal atrial fibrillation) (HCC)   Paroxysmal atrial fibrillation Chronic condition  Managed with metoprolol  and warfarin. Current INR is 2.4, below the target range of 2.5-3.5. Adjustments to warfarin dosing are necessary to achieve target INR. - Adjusted warfarin dosing to 3 mg on Mondays and Fridays, and 4 mg on other days - Ordered prescription for 3 mg warfarin tablets - Scheduled follow-up PT/INR in two weeks      Relevant Medications   warfarin (COUMADIN ) 3 MG tablet   Chronic diastolic heart failure, NYHA class 2 (HCC)   Chronic diastolic heart failure, NYHA class 2  Chronic diastolic heart failure, NYHA class 2, managed with metoprolol  and torsemide . Bilateral 2+ pitting edema present. - Continue metoprolol  100 mg daily - Continue torsemide  40 mg twice daily - Use compression socks for lower extremity edema      Relevant Medications   warfarin (COUMADIN ) 3 MG tablet   Essential (primary) hypertension - Primary   Chronic condition  Well controlled with current medication regimen. - Continue metoprolol  100 mg daily      Relevant Medications   warfarin (COUMADIN ) 3 MG tablet   Mitral regurgitation and mitral stenosis   Relevant Medications   warfarin (COUMADIN ) 3 MG tablet   Urge incontinence   Urge incontinence Chronic  Present for six months. Discussed starting VESIcare to manage symptoms. Potential side effects include constipation and dry mouth. Plan to monitor for fluid retention and adjust dosage as needed. - Started VESIcare 5 mg once daily - Instructed to monitor for side effects such as constipation and dry mouth - Instructed to monitor for signs of fluid retention - Will consider increasing VESIcare to 10 mg if tolerated after one week      Relevant Medications   solifenacin (VESICARE) 5 MG tablet    Assessment and Plan Assessment & Plan    Seizure disorder Chronic  Managed with Dilantin . No recent seizure activity reported. - Continue  Dilantin  400 mg daily - Ordered Dilantin  level to ensure therapeutic range  Osteoarthritis of left knee Chronic  Osteoarthritis of left knee with bone-on-bone changes. Previous treatments include gel and cortisone injections with temporary relief. Considering physical therapy to strengthen muscles around the knee. Discussed potential knee replacement surgery if conservative measures fail. - Continue physical therapy to strengthen knee muscles - Continue using walker for stability - Continue follow up with orthopedic specialist for further management  General health maintenance Discussed pneumococcal vaccination due to increased risk from exposure to children and potential immunocompromise. - Administered pneumococcal vaccine     Return in about 2 weeks (around 10/16/2024) for Return in 2 weeks for INR/coumadin .         Rockie Agent, MD  [facility] [phone] (phone) [phone] (fax)  [facility]

## 2024-10-02 NOTE — Assessment & Plan Note (Signed)
 Chronic diastolic heart failure, NYHA class 2 Chronic diastolic heart failure, NYHA class 2, managed with metoprolol  and torsemide . Bilateral 2+ pitting edema present. - Continue metoprolol  100 mg daily - Continue torsemide  40 mg twice daily - Use compression socks for lower extremity edema

## 2024-10-02 NOTE — Assessment & Plan Note (Signed)
 Paroxysmal atrial fibrillation Chronic condition  Managed with metoprolol  and warfarin. Current INR is 2.4, below the target range of 2.5-3.5. Adjustments to warfarin dosing are necessary to achieve target INR. - Adjusted warfarin dosing to 3 mg on Mondays and Fridays, and 4 mg on other days - Ordered prescription for 3 mg warfarin tablets - Scheduled follow-up PT/INR in two weeks

## 2024-10-03 DIAGNOSIS — Z23 Encounter for immunization: Secondary | ICD-10-CM

## 2024-10-03 NOTE — Addendum Note (Signed)
 Addended by: CHERRY CHIQUITA HERO on: 10/03/2024 08:13 AM   Modules accepted: Orders

## 2024-10-06 ENCOUNTER — Encounter: Admitting: Orthopaedic Surgery

## 2024-10-06 ENCOUNTER — Other Ambulatory Visit (HOSPITAL_COMMUNITY): Payer: Self-pay

## 2024-10-06 ENCOUNTER — Telehealth: Payer: Self-pay | Admitting: Family Medicine

## 2024-10-06 NOTE — Telephone Encounter (Signed)
 Encounter made in error.

## 2024-10-07 ENCOUNTER — Telehealth: Payer: Self-pay

## 2024-10-07 ENCOUNTER — Other Ambulatory Visit (HOSPITAL_COMMUNITY): Payer: Self-pay

## 2024-10-07 NOTE — Telephone Encounter (Signed)
 Pharmacy Patient Advocate Encounter  Received notification from [employer] that Prior Authorization for Solifenacin  Succinate 5MG  tablets  has been APPROVED from 10/06/24 to 10/29/25. Ran test claim, Copay is $10. This test claim was processed through [facility]- copay amounts may vary at other pharmacies due to pharmacy/plan contracts, or as the patient moves through the different stages of their insurance plan.   PA #/Case ID/Reference #: [id number]

## 2024-10-15 ENCOUNTER — Ambulatory Visit

## 2024-10-15 DIAGNOSIS — I052 Rheumatic mitral stenosis with insufficiency: Secondary | ICD-10-CM

## 2024-10-15 LAB — POCT INR
INR: 2.9 (ref 2.0–3.0)
POC INR: 2.9
PT: 34.4

## 2024-10-15 NOTE — Patient Instructions (Signed)
 Description   Continue 4 mg daily except 3 mg on M--F. Return in 3 weeks.

## 2024-10-28 ENCOUNTER — Other Ambulatory Visit: Payer: Self-pay | Admitting: Family Medicine

## 2024-10-28 DIAGNOSIS — G40909 Epilepsy, unspecified, not intractable, without status epilepticus: Secondary | ICD-10-CM

## 2024-11-05 ENCOUNTER — Encounter: Admitting: Orthopaedic Surgery

## 2024-11-05 ENCOUNTER — Ambulatory Visit

## 2024-11-05 DIAGNOSIS — I052 Rheumatic mitral stenosis with insufficiency: Secondary | ICD-10-CM

## 2024-11-05 LAB — POCT INR
INR: 2.8 (ref 2.0–3.0)
POC INR: 2.8
PT: 33.6

## 2024-11-05 NOTE — Patient Instructions (Signed)
 Description   Continue 4 mg daily except 3 mg on M--F. Return in 4 weeks.

## 2024-11-20 ENCOUNTER — Encounter: Admitting: Orthopaedic Surgery

## 2024-12-03 ENCOUNTER — Ambulatory Visit

## 2024-12-03 DIAGNOSIS — I050 Rheumatic mitral stenosis: Secondary | ICD-10-CM

## 2024-12-03 DIAGNOSIS — Z952 Presence of prosthetic heart valve: Secondary | ICD-10-CM | POA: Diagnosis not present

## 2024-12-03 DIAGNOSIS — I480 Paroxysmal atrial fibrillation: Secondary | ICD-10-CM

## 2024-12-03 DIAGNOSIS — I052 Rheumatic mitral stenosis with insufficiency: Secondary | ICD-10-CM

## 2024-12-03 LAB — POCT INR
INR: 2.8 (ref 2.0–3.0)
POC INR: 2.8
PT: 34

## 2024-12-03 NOTE — Patient Instructions (Signed)
 Description   Continue 4 mg daily except 3 mg on M--F. Return in 4 weeks.

## 2024-12-22 ENCOUNTER — Encounter: Admitting: Orthopaedic Surgery

## 2024-12-31 ENCOUNTER — Ambulatory Visit

## 2025-01-06 ENCOUNTER — Ambulatory Visit: Admitting: Family Medicine

## 2025-10-06 ENCOUNTER — Ambulatory Visit
